# Patient Record
Sex: Male | Born: 1955
Health system: Southern US, Community
[De-identification: ages and names within clinical notes are randomized; demographics above are authoritative.]

## PROBLEM LIST (undated history)

## (undated) DIAGNOSIS — G4733 Obstructive sleep apnea (adult) (pediatric): Secondary | ICD-10-CM

## (undated) DIAGNOSIS — F1911 Other psychoactive substance abuse, in remission: Secondary | ICD-10-CM

## (undated) DIAGNOSIS — M779 Enthesopathy, unspecified: Secondary | ICD-10-CM

## (undated) DIAGNOSIS — E785 Hyperlipidemia, unspecified: Secondary | ICD-10-CM

## (undated) DIAGNOSIS — I739 Peripheral vascular disease, unspecified: Secondary | ICD-10-CM

## (undated) DIAGNOSIS — N200 Calculus of kidney: Secondary | ICD-10-CM

## (undated) DIAGNOSIS — E538 Deficiency of other specified B group vitamins: Secondary | ICD-10-CM

## (undated) HISTORY — DX: Other psychoactive substance abuse, in remission: F19.11

## (undated) HISTORY — DX: Hyperlipidemia, unspecified: E78.5

## (undated) HISTORY — PX: VARICOSE VEIN SURGERY: SHX832

## (undated) HISTORY — DX: Calculus of kidney: N20.0

## (undated) HISTORY — DX: Obstructive sleep apnea (adult) (pediatric): G47.33

## (undated) HISTORY — DX: Deficiency of other specified B group vitamins: E53.8

## (undated) HISTORY — PX: TONSILLECTOMY: SUR1361

## (undated) HISTORY — PX: TOTAL KNEE ARTHROPLASTY: SHX125

---

## 2002-10-14 DIAGNOSIS — I739 Peripheral vascular disease, unspecified: Secondary | ICD-10-CM

## 2002-10-14 HISTORY — DX: Peripheral vascular disease, unspecified: I73.9

## 2003-10-15 HISTORY — PX: BACK SURGERY: SHX140

## 2004-06-13 ENCOUNTER — Inpatient Hospital Stay (HOSPITAL_COMMUNITY): Admission: AD | Admit: 2004-06-13 | Discharge: 2004-06-15 | Payer: Self-pay | Admitting: Orthopaedic Surgery

## 2005-11-30 ENCOUNTER — Emergency Department: Payer: Self-pay | Admitting: Emergency Medicine

## 2010-08-03 ENCOUNTER — Encounter: Admission: RE | Admit: 2010-08-03 | Discharge: 2010-08-03 | Payer: Self-pay | Admitting: Orthopedic Surgery

## 2010-10-14 DIAGNOSIS — M779 Enthesopathy, unspecified: Secondary | ICD-10-CM

## 2010-10-14 HISTORY — DX: Enthesopathy, unspecified: M77.9

## 2010-10-14 HISTORY — PX: KNEE ARTHROPLASTY: SHX992

## 2010-10-31 LAB — CBC
HCT: 41.4 % (ref 39.0–52.0)
Hemoglobin: 14.4 g/dL (ref 13.0–17.0)
MCH: 31 pg (ref 26.0–34.0)
MCHC: 34.8 g/dL (ref 30.0–36.0)
MCV: 89.2 fL (ref 78.0–100.0)
Platelets: 230 10*3/uL (ref 150–400)
RBC: 4.64 MIL/uL (ref 4.22–5.81)
RDW: 12.4 % (ref 11.5–15.5)
WBC: 7.7 10*3/uL (ref 4.0–10.5)

## 2010-10-31 LAB — DIFFERENTIAL
Basophils Absolute: 0 10*3/uL (ref 0.0–0.1)
Basophils Relative: 1 % (ref 0–1)
Eosinophils Absolute: 0.2 10*3/uL (ref 0.0–0.7)
Eosinophils Relative: 2 % (ref 0–5)
Lymphocytes Relative: 22 % (ref 12–46)
Lymphs Abs: 1.7 10*3/uL (ref 0.7–4.0)
Monocytes Absolute: 0.9 10*3/uL (ref 0.1–1.0)
Monocytes Relative: 11 % (ref 3–12)
Neutro Abs: 5 10*3/uL (ref 1.7–7.7)
Neutrophils Relative %: 65 % (ref 43–77)

## 2010-10-31 LAB — APTT: aPTT: 33 seconds (ref 24–37)

## 2010-10-31 LAB — BASIC METABOLIC PANEL
BUN: 23 mg/dL (ref 6–23)
CO2: 25 mEq/L (ref 19–32)
Calcium: 9.4 mg/dL (ref 8.4–10.5)
Chloride: 110 mEq/L (ref 96–112)
Creatinine, Ser: 1.07 mg/dL (ref 0.4–1.5)
GFR calc Af Amer: >60 mL/min (ref >60)
GFR calc non Af Amer: >60 mL/min (ref >60)
Glucose, Bld: 81 mg/dL (ref 70–99)
Potassium: 3.9 mEq/L (ref 3.5–5.1)
Sodium: 141 mEq/L (ref 135–145)

## 2010-10-31 LAB — URINALYSIS, ROUTINE W REFLEX MICROSCOPIC
Hgb urine dipstick: NEGATIVE
Ketones, ur: NEGATIVE mg/dL
Nitrite: NEGATIVE
Protein, ur: NEGATIVE mg/dL
Specific Gravity, Urine: 1.027 (ref 1.005–1.030)
Urine Glucose, Fasting: NEGATIVE mg/dL
Urobilinogen, UA: 0.2 mg/dL (ref 0.0–1.0)
pH: 5.5 (ref 5.0–8.0)

## 2010-10-31 LAB — PROTIME-INR
INR: 0.98 (ref 0.00–1.49)
Prothrombin Time: 13.2 seconds (ref 11.6–15.2)

## 2010-10-31 LAB — SURGICAL PCR SCREEN
MRSA, PCR: NEGATIVE
Staphylococcus aureus: NEGATIVE

## 2010-11-05 ENCOUNTER — Inpatient Hospital Stay (HOSPITAL_COMMUNITY)
Admission: RE | Admit: 2010-11-05 | Discharge: 2010-11-07 | Payer: Self-pay | Source: Home / Self Care | Attending: Orthopedic Surgery | Admitting: Orthopedic Surgery

## 2010-11-06 LAB — TYPE AND SCREEN
ABO/RH(D): O POS
Antibody Screen: NEGATIVE

## 2010-11-06 LAB — ABO/RH: ABO/RH(D): O POS

## 2010-11-07 LAB — BASIC METABOLIC PANEL
BUN: 17 mg/dL (ref 6–23)
BUN: 16 mg/dL (ref 6–23)
CO2: 28 mEq/L (ref 19–32)
CO2: 27 mEq/L (ref 19–32)
Calcium: 8.1 mg/dL — ABNORMAL LOW (ref 8.4–10.5)
Calcium: 8.3 mg/dL — ABNORMAL LOW (ref 8.4–10.5)
Chloride: 108 mEq/L (ref 96–112)
Chloride: 109 mEq/L (ref 96–112)
Creatinine, Ser: 0.91 mg/dL (ref 0.4–1.5)
Creatinine, Ser: 0.97 mg/dL (ref 0.4–1.5)
GFR calc Af Amer: >60 mL/min (ref >60)
GFR calc Af Amer: >60 mL/min (ref >60)
GFR calc non Af Amer: >60 mL/min (ref >60)
GFR calc non Af Amer: >60 mL/min (ref >60)
Glucose, Bld: 131 mg/dL — ABNORMAL HIGH (ref 70–99)
Glucose, Bld: 146 mg/dL — ABNORMAL HIGH (ref 70–99)
Potassium: 4.1 mEq/L (ref 3.5–5.1)
Potassium: 3.7 mEq/L (ref 3.5–5.1)
Sodium: 141 mEq/L (ref 135–145)
Sodium: 141 mEq/L (ref 135–145)

## 2010-11-07 LAB — CBC
HCT: 29.2 % — ABNORMAL LOW (ref 39.0–52.0)
HCT: 28.4 % — ABNORMAL LOW (ref 39.0–52.0)
Hemoglobin: 10 g/dL — ABNORMAL LOW (ref 13.0–17.0)
Hemoglobin: 10.1 g/dL — ABNORMAL LOW (ref 13.0–17.0)
MCH: 30.5 pg (ref 26.0–34.0)
MCH: 31.4 pg (ref 26.0–34.0)
MCHC: 34.2 g/dL (ref 30.0–36.0)
MCHC: 35.6 g/dL (ref 30.0–36.0)
MCV: 89 fL (ref 78.0–100.0)
MCV: 88.2 fL (ref 78.0–100.0)
Platelets: 153 10*3/uL (ref 150–400)
Platelets: 159 10*3/uL (ref 150–400)
RBC: 3.28 MIL/uL — ABNORMAL LOW (ref 4.22–5.81)
RBC: 3.22 MIL/uL — ABNORMAL LOW (ref 4.22–5.81)
RDW: 12.4 % (ref 11.5–15.5)
RDW: 12.5 % (ref 11.5–15.5)
WBC: 7.9 10*3/uL (ref 4.0–10.5)
WBC: 9.8 10*3/uL (ref 4.0–10.5)

## 2010-11-12 NOTE — Op Note (Signed)
NAMEJERARD, BAYS NO.:  [id number]  MEDICAL RECORD NO.:  [id number]          PATIENT TYPE:  INP  LOCATION:  0003                         FACILITY:  [facility]  PHYSICIAN:  Madlyn Frankel. Charlann Boxer, M.D.  DATE OF BIRTH:  May 29, 1956  DATE OF PROCEDURE:  11/05/2010 DATE OF DISCHARGE:                              OPERATIVE REPORT   PREOPERATIVE DIAGNOSIS:  Left knee osteoarthritis.  POSTOPERATIVE DIAGNOSIS:  Left knee osteoarthritis.  PROCEDURE:  Left total knee replacement utilizing DePuy rotating platform posterior stabilized knee system size 6 femur, 5 tibia, 10 mm insert to match the 6 femur and a 41 patellar button.  SURGEON:  Madlyn Frankel. Charlann Boxer, M.D.  ASSISTANT:  Nelia Shi. Webb Silversmith, nurse practitioner.  ANESTHESIA:  General.  SPECIMENS:  None.  COMPLICATIONS:  None.  DRAINS:  One Hemovac.  TOURNIQUET TIME:  44 minutes at 250 mmHg.  BLOOD LOSS:  About 100 cc.  INDICATION OF THE PROCEDURE:  Mr. Liston is a 55 year old male who presented to the office for evaluation of left greater than right knee pain.  The radiographs revealed bone-on-bone changes medially predominantly.  There was some concern about subluxation and tricompartmental degenerative changes that made him more of a candidate for total knee replacement versus partial.  Risks of infection, DVT, component failure, need for revision surgery for any reason were discussed and reviewed.  Consent was obtained after reviewing the postop course and expectations.  PROCEDURE IN DETAIL:  The patient was brought to operative theater. Once adequate anesthesia, preoperative antibiotics, and Ancef 2 g administered, the patient was positioned supine on the operating table. Thigh tourniquet was placed.  Left lower extremity was then prepped and draped in sterile fashion with the left foot into Mayo leg holder.  Time- out was performed identifying the patient, planned procedure, and extremity.  Leg was  exsanguinated, tourniquet elevated to 250 mmHg.  Midline incision was made followed by median arthrotomy.  Following initial exposure and soft tissue debridement, attention was first directed to patella.  Precut measurement was 26 mm, this was resected down to 15 mm and used a 41 patella button to restore height.  Lug holes were drilled and a metal shim placed to protect the cut surface and retraction saw blades.  Attention was now directed to the femur.  Femoral canal was opened.  A drill irrigated to try to prevent fat emboli and intramedullary rod was passed and 5 degrees of valgus 10 mm bone was resected off the distal femur.  Following this resection, the tibia was subluxated anteriorly. Using extramedullary guide, I made a measured cut of 10 mm based off the proximal lateral tibia.  This cut was confirmed to be perpendicular in the coronal plane and parallel to the tibial axis.  At this point, I sized the femur to be a size 6.  While we opened up the size 6 components, attention was directed to the tibia for completion.  I removed medial osteophytes, and the tibial surface seemed to be best fit with size 5 tray.  This tray was pinned into position, drilled and keel punched.  At this  time, the instrumentation for the 6 femur was opened.  A size 6 rotation block was then pinned into position, anterior referenced, and held parallel to the tibial tray.  It was pinned into position.  Thus 4/1 cutting block was then positioned.  Anterior-posterior and chamfer cuts were then made without difficulty nor notching.  At this point, final box cut was made off to the lateral aspect of distal femur.  The size 6 femoral component was then impacted into position and trial reduction was carried out with a 10-mm insert.  We found the knee came to full extension and flexed nicely with the patella tracking through the trochlea without application of pressure.  Given this, the final components  were opened holding the polyethylene insert. The trial components removed.  Synovial capsule junction was injected with 0.4% Marcaine with epinephrine and 1 cc of Toradol.  Following further debridements, the knee was irrigated with normal saline solution with pulse lavage and cement mixed.  Final components were then cemented onto clean and dried cuts surfaces of bone with a 10-mm insert placed.  The knee was brought to full extension and extruded cement was removed.  Once cement had fully cured, excess cement was removed throughout the knee.  Once I was satisfied that it was unable to visualize any more, we chose a 10-mm insert based on trial reductions.  The 10-mm insert was then placed, and the knee reduced.  The knee was irrigated throughout the case and again at this point.  Tourniquet had been let down after 44 minutes.  Medium Hemovac drain was placed deep.  The extensor mechanism was then reapproximated with the knee in flexion using #1 Vicryl.  The remainder of the wound was closed with 2-0 Vicryl and running 4-0 Monocryl.  The knee was cleaned, dried, and dressed sterilely utilizing Dermabond and Aquacel dressing.  The patient was then brought to recovery room in stable condition tolerating the procedure well.     Madlyn Frankel Charlann Boxer, M.D.     MDO/MEDQ  D:  11/05/2010  T:  11/05/2010  Job:  [id number]  Electronically Signed by Durene Romans M.D. on 11/12/2010 07:05:14 AM

## 2010-11-25 NOTE — Discharge Summary (Signed)
  NAMEFLOY, Steve Lynch NO.:  [id number]  MEDICAL RECORD NO.:  [id number]          PATIENT TYPE:  INP  LOCATION:  1605                         FACILITY:  [facility]  PHYSICIAN:  Steve Frankel. Charlann Lynch, M.D.  DATE OF BIRTH:  08-19-1956  DATE OF ADMISSION:  11/05/2010 DATE OF DISCHARGE:                              DISCHARGE SUMMARY   ADMITTING DIAGNOSIS:  Left knee osteoarthritis.  BRIEF HISTORY:  This 55 year old gentleman followed by Dr. Charlann Lynch for about 2 years for left knee issues, in October came progressively worsened.  After failed conservative treatments, he decided to move forward with arthroplasty.  HOSPITAL COURSE:  The patient was admitted through same-day surgery on 23rd, taken to the operating theater and underwent left knee arthroplasty without any difficulties, taken to PACU for recovery and brought to [facility] for further recovery and rehabilitation.  Since that time, he has advanced his diet to regular.  He has been on physical therapy and done well.  Discharge disposition plan is for SNF for rehabilitation and follow up in less than 2 weeks.  PAST MEDICAL HISTORY:  Significant only for degenerative disk disease. He is otherwise healthy.  MEDICATIONS:  He takes no medications.  ALLERGIES:  He has no allergies.  DISCHARGE CONDITION:  Good.  DISCHARGE DIAGNOSIS:  Left knee osteoarthritis status post arthroplasty.  DISCHARGE INSTRUCTIONS:  Instructions were given.  He knows to keep the wound dry, but he can shower.  He has an Aquacel dressing in place which will need to come off in about 7 days.  He will follow up with Dr. Nilsa Nutting office in 2 weeks.  He will be in a SNF for rehabilitation. Asked him places of preference.  LABORATORY DATA:  His laboratories have been stable.  His vital signs are stable this morning.  His H and H are 10.1 and 28.4.  His sodium is 141, potassium 3.7.  DISCHARGE MEDICATIONS: 1. Acetaminophen 325 mg every 4 hours as  needed. 2. Keflex 500 mg 4 times a day by mouth for 14 days for a possible     oral infection. 3. Docusate sodium 100 mg twice daily as needed. 4. Ferrous sulfate 325 mg 3 times a day for 3 weeks. 5. Hydrocodone  7.5/325, 1-2 every 4-6 hours p.r.n. pain. 6. Robaxin 500 mg every 6 hours as needed. 7. MiraLax 17 grams a day as needed. 8. Xarelto 10 mg a day for 10 days. 9. Fish oil daily. 10.Ibuprofen as needed. 11.Lodine as needed twice daily.     Russell L. Webb Silversmith, RN   ______________________________ Steve Lynch, M.D.    RLW/MEDQ  D:  11/07/2010  T:  11/07/2010  Job:  [id number]  Electronically Signed by Lauree Chandler NP-C on 11/21/2010 09:42:01 AM Electronically Signed by Durene Romans M.D. on 11/25/2010 09:16:33 AM

## 2010-12-10 ENCOUNTER — Encounter: Payer: Self-pay | Admitting: Orthopedic Surgery

## 2010-12-11 ENCOUNTER — Other Ambulatory Visit: Payer: Self-pay | Admitting: Oral Surgery

## 2010-12-11 DIAGNOSIS — R52 Pain, unspecified: Secondary | ICD-10-CM

## 2010-12-13 ENCOUNTER — Encounter: Payer: Self-pay | Admitting: Orthopedic Surgery

## 2010-12-17 ENCOUNTER — Other Ambulatory Visit: Payer: Self-pay

## 2010-12-20 ENCOUNTER — Ambulatory Visit
Admission: RE | Admit: 2010-12-20 | Discharge: 2010-12-20 | Disposition: A | Discharge status: Home / Self Care | Payer: BC Managed Care – PPO | Source: Ambulatory Visit | Attending: Oral Surgery | Admitting: Oral Surgery

## 2010-12-20 DIAGNOSIS — R52 Pain, unspecified: Secondary | ICD-10-CM

## 2011-01-13 ENCOUNTER — Encounter: Payer: Self-pay | Admitting: Orthopedic Surgery

## 2011-03-01 NOTE — Op Note (Signed)
NAME:  Steve Lynch, Steve Lynch                            ACCOUNT NO.:  [id number]   MEDICAL RECORD NO.:  [id number]                   PATIENT TYPE:  OIB   LOCATION:  2899                                 FACILITY:  [facility]   PHYSICIAN:  Sharolyn Douglas, M.D.                     DATE OF BIRTH:  Dec 12, 1955   DATE OF PROCEDURE:  06/13/2004  DATE OF DISCHARGE:                                 OPERATIVE REPORT   DIAGNOSIS:  C6-7 herniated nucleus pulposus with right upper extremity  radiculopathy.   PROCEDURES:  1.  Anterior cervical discectomy C6-7.  2.  Anterior cervical arthrodesis C6-7.  Placement of 8 mm allograft      prosthesis spacer packed with local autogenous bone graft.  3.  Anterior cervical plating C6-7.   SURGEON:  Sharolyn Douglas, M.D.   ASSISTANT:  Verlin Fester, P.A.   ANESTHESIA:  General endotracheal anesthesia.   COMPLICATIONS:  None.   INDICATIONS FOR PROCEDURE:  The patient is a 55 year old pleasant gentleman  with severe right upper extremity radiculopathy secondary to a large C6-7  disc rupture.  He has been refractory to conservative treatment options,  elected to undergo anterior cervical discectomy and fusion C6-7 in hopes of  improving his symptoms.  Risks, benefits, and alternatives were reviewed.   DESCRIPTION OF PROCEDURE:  The patient was properly identified in the  holding area, taken to the operating room where he underwent general  endotracheal anesthesia without difficulty, given prophylactic IV  antibiotics, carefully positioned with his neck in slight extension, 5  pounds halter traction applied, neck prepped and draped in usual sterile  fashion.  A 5 cm incision made in the left side of the neck in transverse  fashion just below the cricoid cartilage.  Dissection was carried sharply  through the platysma.  The interval between the SCM and strap muscles was  developed down to the prevertebral space.  The C6-7 level was identified by  the anterior osteophyte.  Spinal  needle placed.  X-ray taken to confirm this  location.  The esophagus, trachea and carotid sheath were identified and  protected at all times.  Longus colli muscle elevated  out of the C6-7 disc  space.  Deep Shadow-Line retractor placed.  Sharp annulotomy.  Radical  discectomy back to the posterior longitudinal ligament.  Caspar distraction.  Pins were placed in C6-C7 vertebral bodies.  There was a large rent noted in  the posterior longitudinal ligament on the right side.  The ligament was  taken down and a large subligamentous disc rupture was removed from the  spinal canal and right neural foramen.  A foraminotomy was completed.  Spinal canal was explored with a blunt probe.  We felt we had a good  decompression.  Wound was irrigated.  Bleeding controlled with bipolar  electrocautery and FloSeal.  At this point, we placed an 8 mm Allograft  prosthesis spacer which had been packed with local bone graft from the bur  shavings.  The graft was carefully countersunk 1 mm.  We then placed a 26 mm  [employer] anterior cervical plate with four 13 mm screws.  We insured  the locking mechanism engaged.  The bone quality was good.  The wound was  irrigated.  Esophagus, trachea and carotid sheath examined. There were no  apparent injuries. The Penrose drain left in place.  The platysma closed  with interrupted 2-0 Vicryl.  Subcutaneous layer closed with 2-  0 Vicryl followed by 3-0 Vicryl then a subcuticular 4-0 Vicryl suture on the  skin edges.  Benzoin and Steri-Strips placed. Sterile dressing applied.  Soft collar placed.  The patient was extubated without difficulty,  transferred to recovery in stable condition able to move his upper and lower  extremities.                                               Sharolyn Douglas, M.D.    MC/MEDQ  D:  06/13/2004  T:  06/14/2004  Job:  [id number]

## 2011-05-03 ENCOUNTER — Other Ambulatory Visit (HOSPITAL_COMMUNITY): Payer: Self-pay | Admitting: Family Medicine

## 2011-05-03 ENCOUNTER — Ambulatory Visit (HOSPITAL_COMMUNITY)
Admission: RE | Admit: 2011-05-03 | Discharge: 2011-05-03 | Disposition: A | Discharge status: Home / Self Care | Payer: BC Managed Care – PPO | Source: Ambulatory Visit | Attending: Family Medicine | Admitting: Family Medicine

## 2011-05-03 DIAGNOSIS — R0602 Shortness of breath: Secondary | ICD-10-CM | POA: Insufficient documentation

## 2011-05-03 DIAGNOSIS — R05 Cough: Secondary | ICD-10-CM

## 2011-05-03 DIAGNOSIS — R059 Cough, unspecified: Secondary | ICD-10-CM

## 2011-05-06 ENCOUNTER — Other Ambulatory Visit (HOSPITAL_COMMUNITY): Payer: Self-pay | Admitting: Family Medicine

## 2011-05-06 DIAGNOSIS — R1110 Vomiting, unspecified: Secondary | ICD-10-CM

## 2011-05-08 ENCOUNTER — Other Ambulatory Visit (HOSPITAL_COMMUNITY): Payer: BC Managed Care – PPO

## 2011-10-30 ENCOUNTER — Encounter (HOSPITAL_COMMUNITY): Payer: Self-pay | Admitting: Pharmacy Technician

## 2011-11-04 ENCOUNTER — Encounter (HOSPITAL_COMMUNITY): Payer: Self-pay

## 2011-11-04 ENCOUNTER — Encounter (HOSPITAL_COMMUNITY)
Admission: RE | Admit: 2011-11-04 | Discharge: 2011-11-04 | Disposition: A | Discharge status: Home / Self Care | Payer: BC Managed Care – PPO | Source: Ambulatory Visit | Attending: Orthopedic Surgery | Admitting: Orthopedic Surgery

## 2011-11-04 HISTORY — DX: Enthesopathy, unspecified: M77.9

## 2011-11-04 HISTORY — DX: Peripheral vascular disease, unspecified: I73.9

## 2011-11-04 LAB — URINALYSIS, ROUTINE W REFLEX MICROSCOPIC
Hgb urine dipstick: NEGATIVE
Ketones, ur: NEGATIVE mg/dL
Nitrite: NEGATIVE
Protein, ur: NEGATIVE mg/dL
Specific Gravity, Urine: 1.027 (ref 1.005–1.030)
Urobilinogen, UA: 0.2 mg/dL (ref 0.0–1.0)

## 2011-11-04 LAB — CBC
MCH: 29.5 pg (ref 26.0–34.0)
Platelets: 216 10*3/uL (ref 150–400)
RBC: 4.4 MIL/uL (ref 4.22–5.81)
RDW: 12.4 % (ref 11.5–15.5)
WBC: 8 10*3/uL (ref 4.0–10.5)

## 2011-11-04 LAB — APTT: aPTT: 32 seconds (ref 24–37)

## 2011-11-04 LAB — DIFFERENTIAL
Basophils Absolute: 0.1 10*3/uL (ref 0.0–0.1)
Eosinophils Absolute: 0.2 10*3/uL (ref 0.0–0.7)
Lymphs Abs: 1.7 10*3/uL (ref 0.7–4.0)
Neutrophils Relative %: 68 % (ref 43–77)

## 2011-11-04 LAB — BASIC METABOLIC PANEL
Calcium: 9.4 mg/dL (ref 8.4–10.5)
GFR calc Af Amer: >90 mL/min (ref >90)
GFR calc non Af Amer: >90 mL/min (ref >90)
Glucose, Bld: 83 mg/dL (ref 70–99)
Potassium: 3.8 mEq/L (ref 3.5–5.1)
Sodium: 139 mEq/L (ref 135–145)

## 2011-11-04 LAB — PROTIME-INR
INR: 0.99 (ref 0.00–1.49)
Prothrombin Time: 13.3 seconds (ref 11.6–15.2)

## 2011-11-04 MED ORDER — CHLORHEXIDINE GLUCONATE 4 % EX LIQD: 60.0000 mL | Freq: Once | CUTANEOUS | Status: DC

## 2011-11-04 NOTE — Pre-Procedure Instructions (Signed)
STATES HAD EPIDURAL STEROID INJECTION DR RAMOS 2 MONTHS AGO-        CLEARANCE DR Mellody Dance DATED 10/12 ON CHART    DENIES REFLUX- states takes Zantac PRN related to type of food ingested

## 2011-11-04 NOTE — Pre-Procedure Instructions (Signed)
Notified [facility] IN PORTABLE FOR NEED OF BARIATRIC BED DAY OF SURGERY

## 2011-11-04 NOTE — Patient Instructions (Signed)
20 Jahseh Lucchese Pauli  11/04/2011   Your procedure is scheduled on: 11/12/11  Tuesday   Surgery [id number]  Report to [facility] at 0515 AM.  [place] this number if you have problems the morning of surgery: [phone]    pst   [phone]   Remember:   Do not eat food:After Midnight. Monday night  May have clear liquids:until Midnight .   Monday night  Clear liquids include soda, tea, black coffee, apple or grape juice, broth.  Take these medicines the morning of surgery with A SIP OF WATER :none  Do not wear jewelry, make-up or nail polish.  Do not wear lotions, powders, or perfumes. You may wear deodorant.  Do not shave 48 hours prior to surgery.  Do not bring valuables to the hospital.  Contacts, dentures or bridgework may not be worn into surgery.  Leave suitcase in the car. After surgery it may be brought to your room.  For patients admitted to the hospital, checkout time is 11:00 AM the day of discharge.   Patients discharged the day of surgery will not be allowed to drive home.  Name and phone number of your driver:[employer] Special Instructions: CHG Shower Use Special Wash: 1/2 bottle night before surgery and 1/2 bottle morning of surgery.   Regular soap face and privates   Please read over the following fact sheets that you were given: MRSA Information

## 2011-11-09 NOTE — H&P (Signed)
Steve Lynch is an 56 y.o. male.    Chief Complaint: right knee OA and pain   HPI: Pt is a 56 y.o. male complaining of right knee pain for 6 years. Pain had continually increased since the beginning. Has a history of a previous left TKA. X-rays in the clinic show end-stage arthritic changes of the right knee. Pt has tried various conservative treatments which have failed to alleviate their symptoms, including steroid injections and NSAIDs. Various options are discussed with the patient. Risks, benefits and expectations were discussed with the patient. Patient understand the risks, benefits and expectations and wishes to proceed with surgery.   PCP:  No primary provider on file.  D/C Plans:  SNF/Rehab - Steve Lynch preferred  Post-op Meds:  No Rx given  Tranexamic Acid:  Not to be given  PMH: Past Medical History  Diagnosis Date  . Peripheral vascular disease     varicose veins right leg  . Tendonitis     temporal tendonitis as per pt 1/12 with locked jaw following tooth extraction    PSH: Past Surgical History  Procedure Date  . Schleratherapy     right leg  . Joint replacement   . Hernia repair     1/12   left knee  . Vascular surgery   . Back surgery     "DISC  REPLACEMENT"   L5-6   2005  . Tonsillectomy     Social History:  reports that he has never smoked. He has never used smokeless tobacco. He reports that he drinks alcohol. He reports that he does not use illicit drugs.  Allergies:  No Known Allergies  Medications: Current Facility-Administered Medications  Medication Dose Route Frequency Provider Last Rate Last Dose  . chlorhexidine (HIBICLENS) 4 % liquid 4 application  60 mL Topical Once Steve Lynch, [place]       Current Outpatient Prescriptions  Medication Sig Dispense Refill  . etodolac (LODINE) 400 MG tablet Take 400 mg by mouth 2 (two) times daily.      . fish oil-omega-3 fatty acids 1000 MG capsule Take 1 g by mouth daily.      Marland Kitchen ibuprofen  (ADVIL,MOTRIN) 200 MG tablet Take 400 mg by mouth every 6 (six) hours as needed. Pain       . ranitidine (ZANTAC) 150 MG tablet Take 150 mg by mouth daily as needed. Heart burn         ROS: Review of Systems  Constitutional: Negative.   HENT: Positive for tinnitus.   Eyes: Negative.   Respiratory: Negative.   Cardiovascular: Negative.   Gastrointestinal: Negative.   Genitourinary: Negative.   Musculoskeletal: Positive for back pain and joint pain.  Skin: Negative.   Neurological: Negative.   Endo/Heme/Allergies: Negative.   Psychiatric/Behavioral: Negative.      Physican Exam: Physical Exam  Constitutional: He is oriented to person, Lynch, and time and well-developed, well-nourished, and in no distress.  HENT:  Head: Normocephalic and atraumatic.  Nose: Nose normal.  Mouth/Throat: Oropharynx is clear and moist.  Neck: Neck supple. No JVD present. No tracheal deviation present. No thyromegaly present.  Cardiovascular: Normal rate and regular rhythm.   Pulmonary/Chest: Effort normal and breath sounds normal. No stridor.  Abdominal: Soft. There is no tenderness. There is no guarding.  Musculoskeletal:       Right knee: He exhibits decreased range of motion and bony tenderness. He exhibits no swelling, no effusion, no ecchymosis, no deformity, no laceration and no erythema. tenderness  found.  Lymphadenopathy:    He has no cervical adenopathy.  Neurological: He is alert and oriented to person, Lynch, and time.  Skin: Skin is warm and dry.  Psychiatric: Affect normal.    Assessment/Plan Assessment: right knee OA and pain   Plan: Patient will undergo a right total knee arthroplasty on 11/12/2011. Risks benefits and expectation were discussed with the patient. Patient understand risks, benefits and expectation and wishes to proceed.   Anastasio Auerbach Jadarion Halbig   PAC  11/09/2011, 4:55 PM

## 2011-11-12 ENCOUNTER — Encounter (HOSPITAL_COMMUNITY): Payer: Self-pay | Admitting: *Deleted

## 2011-11-12 ENCOUNTER — Encounter (HOSPITAL_COMMUNITY): Payer: Self-pay | Admitting: Anesthesiology

## 2011-11-12 ENCOUNTER — Inpatient Hospital Stay (HOSPITAL_COMMUNITY)
Admission: RE | Admit: 2011-11-12 | Discharge: 2011-11-14 | DRG: 209 | Disposition: A | Discharge status: Skilled Nursing Facility | Payer: BC Managed Care – PPO | Source: Ambulatory Visit | Attending: Orthopedic Surgery | Admitting: Orthopedic Surgery

## 2011-11-12 ENCOUNTER — Inpatient Hospital Stay (HOSPITAL_COMMUNITY): Payer: BC Managed Care – PPO | Admitting: Anesthesiology

## 2011-11-12 ENCOUNTER — Encounter (HOSPITAL_COMMUNITY)
Admission: RE | Disposition: A | Discharge status: Skilled Nursing Facility | Payer: Self-pay | Source: Ambulatory Visit | Attending: Orthopedic Surgery

## 2011-11-12 DIAGNOSIS — I739 Peripheral vascular disease, unspecified: Secondary | ICD-10-CM | POA: Diagnosis present

## 2011-11-12 DIAGNOSIS — M1710 Unilateral primary osteoarthritis, unspecified knee: Principal | ICD-10-CM | POA: Diagnosis present

## 2011-11-12 DIAGNOSIS — Z96659 Presence of unspecified artificial knee joint: Secondary | ICD-10-CM

## 2011-11-12 HISTORY — PX: TOTAL KNEE ARTHROPLASTY: SHX125

## 2011-11-12 LAB — TYPE AND SCREEN: Antibody Screen: NEGATIVE

## 2011-11-12 SURGERY — ARTHROPLASTY, KNEE, TOTAL
Anesthesia: Spinal | Site: Knee | Laterality: Right | Wound class: Clean

## 2011-11-12 MED ORDER — CEFAZOLIN SODIUM-DEXTROSE 2-3 GM-% IV SOLR
2.0000 g | Freq: Once | INTRAVENOUS | Status: AC
  Administered 2011-11-12: 2 g via INTRAVENOUS

## 2011-11-12 MED ORDER — HYDROCODONE-ACETAMINOPHEN 7.5-325 MG PO TABS
1.0000 | ORAL_TABLET | ORAL | Status: DC | PRN
  Administered 2011-11-12 (×2): 1 via ORAL
  Administered 2011-11-13 (×2): 2 via ORAL
  Filled 2011-11-12 (×2): qty 1
  Filled 2011-11-12 (×2): qty 2

## 2011-11-12 MED ORDER — ZOLPIDEM TARTRATE 5 MG PO TABS: 5.0000 mg | ORAL_TABLET | Freq: Every evening | ORAL | Status: DC | PRN

## 2011-11-12 MED ORDER — PROPOFOL 10 MG/ML IV EMUL
INTRAVENOUS | Status: DC | PRN
  Administered 2011-11-12: 100 mg via INTRAVENOUS
  Administered 2011-11-12: 200 mg via INTRAVENOUS

## 2011-11-12 MED ORDER — LACTATED RINGERS IV SOLN
INTRAVENOUS | Status: DC | PRN
  Administered 2011-11-12 (×3): via INTRAVENOUS

## 2011-11-12 MED ORDER — CEFAZOLIN SODIUM 1-5 GM-% IV SOLN
1.0000 g | Freq: Four times a day (QID) | INTRAVENOUS | Status: AC
  Administered 2011-11-12 – 2011-11-13 (×3): 1 g via INTRAVENOUS
  Filled 2011-11-12 (×3): qty 50

## 2011-11-12 MED ORDER — PHENOL 1.4 % MT LIQD
1.0000 | OROMUCOSAL | Status: DC | PRN
  Filled 2011-11-12: qty 177

## 2011-11-12 MED ORDER — METHOCARBAMOL 100 MG/ML IJ SOLN
500.0000 mg | Freq: Four times a day (QID) | INTRAVENOUS | Status: DC | PRN
  Administered 2011-11-12: 500 mg via INTRAVENOUS
  Filled 2011-11-12 (×3): qty 5

## 2011-11-12 MED ORDER — BUPIVACAINE-EPINEPHRINE PF 0.25-1:200000 % IJ SOLN
INTRAMUSCULAR | Status: DC | PRN
  Administered 2011-11-12: 60 mL

## 2011-11-12 MED ORDER — PROMETHAZINE HCL 25 MG/ML IJ SOLN: 6.2500 mg | INTRAMUSCULAR | Status: DC | PRN

## 2011-11-12 MED ORDER — SENNA 8.6 MG PO TABS
1.0000 | ORAL_TABLET | Freq: Two times a day (BID) | ORAL | Status: DC
  Administered 2011-11-12 – 2011-11-14 (×4): 8.6 mg via ORAL
  Filled 2011-11-12 (×4): qty 1

## 2011-11-12 MED ORDER — FENTANYL CITRATE 0.05 MG/ML IJ SOLN
INTRAMUSCULAR | Status: DC | PRN
  Administered 2011-11-12 (×2): 100 ug via INTRAVENOUS
  Administered 2011-11-12 (×3): 50 ug via INTRAVENOUS

## 2011-11-12 MED ORDER — DIPHENHYDRAMINE HCL 12.5 MG/5ML PO ELIX: 25.0000 mg | ORAL_SOLUTION | Freq: Four times a day (QID) | ORAL | Status: DC | PRN

## 2011-11-12 MED ORDER — METHOCARBAMOL 500 MG PO TABS
500.0000 mg | ORAL_TABLET | Freq: Four times a day (QID) | ORAL | Status: DC | PRN
  Administered 2011-11-13 – 2011-11-14 (×2): 500 mg via ORAL
  Filled 2011-11-12 (×3): qty 1

## 2011-11-12 MED ORDER — DEXAMETHASONE SODIUM PHOSPHATE 10 MG/ML IJ SOLN
INTRAMUSCULAR | Status: DC | PRN
  Administered 2011-11-12: 10 mg via INTRAVENOUS

## 2011-11-12 MED ORDER — HYDROMORPHONE HCL PF 1 MG/ML IJ SOLN
0.2000 mg | INTRAMUSCULAR | Status: DC | PRN
  Administered 2011-11-13: 0.6 mg via INTRAVENOUS
  Filled 2011-11-12: qty 1

## 2011-11-12 MED ORDER — SODIUM CHLORIDE 0.9 % IV SOLN
INTRAVENOUS | Status: DC
  Filled 2011-11-12: qty 1000

## 2011-11-12 MED ORDER — HYDROMORPHONE HCL PF 1 MG/ML IJ SOLN: 0.2500 mg | INTRAMUSCULAR | Status: DC | PRN

## 2011-11-12 MED ORDER — ONDANSETRON HCL 4 MG/2ML IJ SOLN
INTRAMUSCULAR | Status: DC | PRN
  Administered 2011-11-12: 4 mg via INTRAVENOUS

## 2011-11-12 MED ORDER — ROPIVACAINE HCL 5 MG/ML IJ SOLN
INTRAMUSCULAR | Status: DC | PRN
  Administered 2011-11-12: 30 mL

## 2011-11-12 MED ORDER — LACTATED RINGERS IV SOLN: INTRAVENOUS | Status: DC | PRN

## 2011-11-12 MED ORDER — ONDANSETRON HCL 4 MG PO TABS: 4.0000 mg | ORAL_TABLET | Freq: Four times a day (QID) | ORAL | Status: DC | PRN

## 2011-11-12 MED ORDER — DOCUSATE SODIUM 100 MG PO CAPS
100.0000 mg | ORAL_CAPSULE | Freq: Two times a day (BID) | ORAL | Status: DC
  Administered 2011-11-12 – 2011-11-14 (×4): 100 mg via ORAL
  Filled 2011-11-12 (×7): qty 1

## 2011-11-12 MED ORDER — DROPERIDOL 2.5 MG/ML IJ SOLN
INTRAMUSCULAR | Status: DC | PRN
  Administered 2011-11-12: 0.625 mg via INTRAVENOUS

## 2011-11-12 MED ORDER — LIDOCAINE HCL (CARDIAC) 20 MG/ML IV SOLN
INTRAVENOUS | Status: DC | PRN
  Administered 2011-11-12: 100 mg via INTRAVENOUS

## 2011-11-12 MED ORDER — POLYETHYLENE GLYCOL 3350 17 G PO PACK
17.0000 g | PACK | Freq: Every day | ORAL | Status: DC | PRN
  Filled 2011-11-12: qty 1

## 2011-11-12 MED ORDER — 0.9 % SODIUM CHLORIDE (POUR BTL) OPTIME
TOPICAL | Status: DC | PRN
  Administered 2011-11-12: 1000 mL

## 2011-11-12 MED ORDER — SODIUM CHLORIDE 0.9 % IV SOLN
INTRAVENOUS | Status: DC
  Administered 2011-11-12 – 2011-11-13 (×4): via INTRAVENOUS
  Filled 2011-11-12 (×11): qty 1000

## 2011-11-12 MED ORDER — MENTHOL 3 MG MT LOZG
1.0000 | LOZENGE | OROMUCOSAL | Status: DC | PRN
  Filled 2011-11-12: qty 9

## 2011-11-12 MED ORDER — RIVAROXABAN 10 MG PO TABS
10.0000 mg | ORAL_TABLET | Freq: Every day | ORAL | Status: DC
  Administered 2011-11-13 – 2011-11-14 (×2): 10 mg via ORAL
  Filled 2011-11-12 (×3): qty 1

## 2011-11-12 MED ORDER — FERROUS SULFATE 325 (65 FE) MG PO TABS
325.0000 mg | ORAL_TABLET | Freq: Three times a day (TID) | ORAL | Status: DC
  Administered 2011-11-12 – 2011-11-14 (×7): 325 mg via ORAL
  Filled 2011-11-12 (×11): qty 1

## 2011-11-12 MED ORDER — LACTATED RINGERS IV SOLN
INTRAVENOUS | Status: DC
  Administered 2011-11-12: 100 mL/h via INTRAVENOUS

## 2011-11-12 MED ORDER — ONDANSETRON HCL 4 MG/2ML IJ SOLN: 4.0000 mg | Freq: Four times a day (QID) | INTRAMUSCULAR | Status: DC | PRN

## 2011-11-12 MED ORDER — SUCCINYLCHOLINE CHLORIDE 20 MG/ML IJ SOLN
INTRAMUSCULAR | Status: DC | PRN
  Administered 2011-11-12: 100 mg via INTRAVENOUS

## 2011-11-12 MED ORDER — MIDAZOLAM HCL 5 MG/5ML IJ SOLN
INTRAMUSCULAR | Status: DC | PRN
  Administered 2011-11-12: 2 mg via INTRAVENOUS

## 2011-11-12 MED ORDER — HYDROMORPHONE HCL PF 1 MG/ML IJ SOLN
INTRAMUSCULAR | Status: DC | PRN
  Administered 2011-11-12: 0.5 mg via INTRAVENOUS
  Administered 2011-11-12: 1 mg via INTRAVENOUS
  Administered 2011-11-12: 0.5 mg via INTRAVENOUS

## 2011-11-12 MED ORDER — ALUM & MAG HYDROXIDE-SIMETH 200-200-20 MG/5ML PO SUSP: 30.0000 mL | ORAL | Status: DC | PRN

## 2011-11-12 MED ORDER — ACETAMINOPHEN 10 MG/ML IV SOLN
INTRAVENOUS | Status: DC | PRN
  Administered 2011-11-12: 1000 mg via INTRAVENOUS

## 2011-11-12 MED ORDER — KETOROLAC TROMETHAMINE 30 MG/ML IJ SOLN
INTRAMUSCULAR | Status: DC | PRN
  Administered 2011-11-12: 30 mg

## 2011-11-12 SURGICAL SUPPLY — 58 items
BAG ZIPLOCK 12X15 (MISCELLANEOUS) ×2 IMPLANT
BANDAGE ELASTIC 6 VELCRO ST LF (GAUZE/BANDAGES/DRESSINGS) ×2 IMPLANT
BANDAGE ESMARK 6X9 LF (GAUZE/BANDAGES/DRESSINGS) ×1 IMPLANT
BLADE SAW SGTL 13.0X1.19X90.0M (BLADE) ×2 IMPLANT
BNDG ESMARK 6X9 LF (GAUZE/BANDAGES/DRESSINGS) ×2
BOWL SMART MIX CTS (DISPOSABLE) ×2 IMPLANT
CEMENT HV SMART SET (Cement) ×4 IMPLANT
CLOTH BEACON ORANGE TIMEOUT ST (SAFETY) ×2 IMPLANT
CUFF TOURN SGL QUICK 34 (TOURNIQUET CUFF) ×1
CUFF TRNQT CYL 34X4X40X1 (TOURNIQUET CUFF) ×1 IMPLANT
DECANTER SPIKE VIAL GLASS SM (MISCELLANEOUS) IMPLANT
DERMABOND ADVANCED (GAUZE/BANDAGES/DRESSINGS) ×1
DERMABOND ADVANCED .7 DNX12 (GAUZE/BANDAGES/DRESSINGS) ×1 IMPLANT
DRAPE EXTREMITY T 121X128X90 (DRAPE) ×2 IMPLANT
DRAPE POUCH INSTRU U-SHP 10X18 (DRAPES) ×2 IMPLANT
DRAPE U-SHAPE 47X51 STRL (DRAPES) ×2 IMPLANT
DRSG AQUACEL AG ADV 3.5X10 (GAUZE/BANDAGES/DRESSINGS) ×2 IMPLANT
DRSG TEGADERM 4X4.75 (GAUZE/BANDAGES/DRESSINGS) ×2 IMPLANT
DURAPREP 26ML APPLICATOR (WOUND CARE) ×2 IMPLANT
ELECT REM PT RETURN 9FT ADLT (ELECTROSURGICAL) ×2
ELECTRODE REM PT RTRN 9FT ADLT (ELECTROSURGICAL) ×1 IMPLANT
EVACUATOR 1/8 PVC DRAIN (DRAIN) ×2 IMPLANT
FACESHIELD LNG OPTICON STERILE (SAFETY) ×10 IMPLANT
GAUZE SPONGE 2X2 8PLY STRL LF (GAUZE/BANDAGES/DRESSINGS) ×1 IMPLANT
GLOVE BIOGEL PI IND STRL 6.5 (GLOVE) ×2 IMPLANT
GLOVE BIOGEL PI IND STRL 7.5 (GLOVE) ×1 IMPLANT
GLOVE BIOGEL PI IND STRL 8 (GLOVE) ×1 IMPLANT
GLOVE BIOGEL PI INDICATOR 6.5 (GLOVE) ×2
GLOVE BIOGEL PI INDICATOR 7.5 (GLOVE) ×1
GLOVE BIOGEL PI INDICATOR 8 (GLOVE) ×1
GLOVE ECLIPSE 8.0 STRL XLNG CF (GLOVE) ×2 IMPLANT
GLOVE ORTHO TXT STRL SZ7.5 (GLOVE) ×4 IMPLANT
GLOVE SURG SS PI 6.5 STRL IVOR (GLOVE) ×4 IMPLANT
GOWN BRE IMP PREV XXLGXLNG (GOWN DISPOSABLE) IMPLANT
GOWN STRL NON-REIN LRG LVL3 (GOWN DISPOSABLE) ×6 IMPLANT
GOWN STRL REIN XL XLG (GOWN DISPOSABLE) ×2 IMPLANT
HANDPIECE INTERPULSE COAX TIP (DISPOSABLE) ×1
IMMOBILIZER KNEE 22 UNIV (SOFTGOODS) ×2 IMPLANT
KIT BASIN OR (CUSTOM PROCEDURE TRAY) ×2 IMPLANT
MANIFOLD NEPTUNE II (INSTRUMENTS) ×2 IMPLANT
NDL SAFETY ECLIPSE 18X1.5 (NEEDLE) ×1 IMPLANT
NEEDLE HYPO 18GX1.5 SHARP (NEEDLE) ×1
NS IRRIG 1000ML POUR BTL (IV SOLUTION) ×4 IMPLANT
PACK TOTAL JOINT (CUSTOM PROCEDURE TRAY) ×2 IMPLANT
POSITIONER SURGICAL ARM (MISCELLANEOUS) ×2 IMPLANT
SET HNDPC FAN SPRY TIP SCT (DISPOSABLE) ×1 IMPLANT
SET PAD KNEE POSITIONER (MISCELLANEOUS) ×2 IMPLANT
SPONGE GAUZE 2X2 STER 10/PKG (GAUZE/BANDAGES/DRESSINGS) ×1
SUCTION FRAZIER 12FR DISP (SUCTIONS) ×2 IMPLANT
SUT MNCRL AB 4-0 PS2 18 (SUTURE) ×2 IMPLANT
SUT VIC AB 1 CT1 36 (SUTURE) ×6 IMPLANT
SUT VIC AB 2-0 CT1 27 (SUTURE) ×3
SUT VIC AB 2-0 CT1 TAPERPNT 27 (SUTURE) ×3 IMPLANT
SYR 50ML LL SCALE MARK (SYRINGE) ×2 IMPLANT
TOWEL OR 17X26 10 PK STRL BLUE (TOWEL DISPOSABLE) ×4 IMPLANT
TRAY FOLEY CATH 14FRSI W/METER (CATHETERS) ×2 IMPLANT
WATER STERILE IRR 1500ML POUR (IV SOLUTION) ×2 IMPLANT
WRAP KNEE MAXI GEL POST OP (GAUZE/BANDAGES/DRESSINGS) ×2 IMPLANT

## 2011-11-12 NOTE — Transfer of Care (Signed)
Immediate Anesthesia Transfer of Care Note  Patient: Steve Lynch  Procedure(s) Performed:  TOTAL KNEE ARTHROPLASTY - Femoral nerve block  Patient Location: PACU  Anesthesia Type: GA combined with regional for post-op pain  Level of Consciousness: awake, alert  and oriented  Airway & Oxygen Therapy: Patient Spontanous Breathing and Patient connected to face mask oxygen  Post-op Assessment: Report given to PACU RN and Post -op Vital signs reviewed and stable  Post vital signs: Reviewed and stable  Complications: No apparent anesthesia complications

## 2011-11-12 NOTE — Op Note (Signed)
NAME:  Yeshua Stryker Macdonnell                      MEDICAL RECORD NO.:  [id number]                             FACILITY:  [facility]      PHYSICIAN:  Madlyn Frankel. Charlann Boxer, M.D.  DATE OF BIRTH:  18-Feb-1956      DATE OF PROCEDURE:  11/12/2011                                     OPERATIVE REPORT         PREOPERATIVE DIAGNOSIS:  Right knee osteoarthritis.      POSTOPERATIVE DIAGNOSIS:  Right knee osteoarthritis.      FINDINGS:  The patient was noted to have complete loss of cartilage and   bone-on-bone arthritis with associated osteophytes in the medial and patellofemoral compartments of   the knee with a significant synovitis and associated effusion.      PROCEDURE:  Right total knee replacement.      COMPONENTS USED:  DePuy rotating platform posterior stabilized knee   system, a size 6 femur, 5 tibia, 12.5 mm insert, and 41 patellar   button.      SURGEON:  Madlyn Frankel. Charlann Boxer, M.D.      ASSISTANT:  Leilani Able, PA-C.      ANESTHESIA:  General and Regional.      SPECIMENS:  None.      COMPLICATION:  None.      DRAINS:  One Hemovac.  EBL: 100cc      TOURNIQUET TIME:   Total Tourniquet Time Documented: Thigh (Right) - 41 minutes .      The patient was stable to the recovery room.      INDICATION FOR PROCEDURE:  Simmie Camerer Forst is a 56 y.o. male patient of   mine.  The patient had been seen, evaluated, and treated conservatively in the   office with medication, activity modification, and injections.  The patient had   radiographic changes of bone-on-bone arthritis with endplate sclerosis and osteophytes noted.  He had successfully made it through his left TKR.     The patient failed conservative measures including medication, injections, and activity modification, and at this point was ready for more definitive measures.   Based on the radiographic changes and failed conservative measures, the patient   decided to proceed with total knee replacement.  Risks of infection,   DVT, component  failure, need for revision surgery, postop course, and   expectations were all   discussed and reviewed.  Consent was obtained for benefit of pain   relief.      PROCEDURE IN DETAIL:  The patient was brought to the operative theater.   Once adequate anesthesia, preoperative antibiotics, 2 gm of Ancef administered, the patient was positioned supine with the right thigh tourniquet placed.  The  right lower extremity was prepped and draped in sterile fashion.  A time-   out was performed identifying the patient, planned procedure, and   extremity.      The right lower extremity was placed in the [facility] leg holder.  The leg was   exsanguinated, tourniquet elevated to 250 mmHg.  A midline incision was   made followed by median parapatellar arthrotomy.  Following initial  exposure, attention was first directed to the patella.  Precut   measurement was noted to be 26 mm.  I resected down to 15 mm and used a   41 patellar button to restore patellar height as well as cover the cut   surface.      The lug holes were drilled and a metal shim was placed to protect the   patella from retractors and saw blades.      At this point, attention was now directed to the femur.  The femoral   canal was opened with a drill, irrigated to try to prevent fat emboli.  An   intramedullary rod was passed at 5 degrees valgus, 10 mm of bone was   resected off the distal femur.  Following this resection, the tibia was   subluxated anteriorly.  Using the extramedullary guide, 10 mm of bone was resected off   the proximal lateral tibia.  We confirmed the gap would be   stable medially and laterally with a 10 mm insert as well as confirmed   the cut was perpendicular in the coronal plane, checking with an alignment rod.      Once this was done, I sized the femur to be a size 6 in the anterior-   posterior dimension, chose a standard component based on medial and   lateral dimension.  The size 6 rotation block was  then pinned in   position anterior referenced using the C-clamp to set rotation.  The   anterior, posterior, and  chamfer cuts were made without difficulty nor   notching making certain that I was along the anterior cortex to help   with flexion gap stability.      The final box cut was made off the lateral aspect of distal femur.      At this point, the tibia was sized to be a size 5, the size 5 tray was   then pinned in position through the medial third of the tubercle,   drilled, and keel punched.  Trial reduction was now carried with a 6 femur,  5 tibia, a 12.5 mm insert, and the 41 patella botton.  The knee was brought to   extension, full extension with good flexion stability with the patella   tracking through the trochlea without application of pressure.  Given   all these findings, the trial components removed.  Final components were   opened and cement was mixed.  The knee was irrigated with normal saline   solution and pulse lavage.  The synovial lining was   then injected with 0.25% Marcaine with epinephrine and 1 cc of Toradol,   total of 61 cc.      The knee was irrigated.  Final implants were then cemented onto clean and   dried cut surfaces of bone with the knee brought to extension with a 12.5   mm trial insert.      Once the cement had fully cured, the excess cement was removed   throughout the knee.  I confirmed I was satisfied with the range of   motion and stability, and the final 12.5 mm insert was chosen.  It was   placed into the knee.      The tourniquet had been let down at 40 minutes.  No significant   hemostasis required.  The medium Hemovac drain was placed deep.  The   extensor mechanism was then reapproximated using #1 Vicryl with the knee   in  flexion.  The   remaining wound was closed with 2-0 Vicryl and running 4-0 Monocryl.   The knee was cleaned, dried, dressed sterilely using Dermabond and   Aquacel dressing.  Drain site dressed separately.  The  patient was then   brought to recovery room in stable condition, tolerating the procedure   well.   Please note that Physician Assistant, Leilani Able, was present for the entirety of the case, and was utilized for pre-operative positioning, peri-operative retractor management, general facilitation of the procedure.  He was also utilized for primary wound closure at the end of the case.              Madlyn Frankel Charlann Boxer, M.D.

## 2011-11-12 NOTE — Anesthesia Postprocedure Evaluation (Signed)
  Anesthesia Post-op Note  Patient: Steve Lynch  Procedure(s) Performed:  TOTAL KNEE ARTHROPLASTY - Femoral nerve block  Patient Location: PACU  Anesthesia Type: General  Level of Consciousness: awake and alert   Airway and Oxygen Therapy: Patient Spontanous Breathing  Post-op Pain: mild  Post-op Assessment: Post-op Vital signs reviewed, Patient's Cardiovascular Status Stable, Respiratory Function Stable, Patent Airway and No signs of Nausea or vomiting  Post-op Vital Signs: stable  Complications: No apparent anesthesia complications

## 2011-11-12 NOTE — Preoperative (Signed)
Beta Blockers   Reason not to administer Beta Blockers:Not Applicable 

## 2011-11-12 NOTE — Interval H&P Note (Signed)
History and Physical Interval Note:  11/12/2011 6:40 AM  Steve Lynch  has presented today for surgery, with the diagnosis of Osteoarthritis Right Knee  The various methods of treatment have been discussed with the patient and family. After consideration of risks, benefits and other options for treatment, the patient has consented to  Procedure(s): RIGHT TOTAL KNEE ARTHROPLASTY as a surgical intervention .  The patients' history has been reviewed, patient examined, no change in status, stable for surgery.  I have reviewed the patients' chart and labs.  Questions were answered to the patient's satisfaction.     Shelda Pal

## 2011-11-12 NOTE — Anesthesia Procedure Notes (Signed)
Anesthesia Regional Block:  Femoral nerve block  Pre-Anesthetic Checklist: ,, timeout performed, Correct Patient, Correct Site, Correct Laterality, Correct Procedure, Correct Position, site marked, Risks and benefits discussed,  Surgical consent,  Pre-op evaluation,  At surgeon's request and post-op pain management  Laterality: Right  Prep: chloraprep       Needles:  Injection technique: Single-shot  Needle Type: Stimiplex     Needle Length:cm 9 cm Needle Gauge: 21 G    Additional Needles:  Procedures: ultrasound guided Femoral nerve block Narrative:  Start time: 11/12/2011 7:11 AM End time: 11/12/2011 7:21 AM Injection made incrementally with aspirations every 30 mL.  Performed by: Personally  Anesthesiologist: Eilene Ghazi MD  Additional Notes: Patient tolerated the procedure well without complications  Femoral nerve block

## 2011-11-12 NOTE — Anesthesia Preprocedure Evaluation (Addendum)
Anesthesia Evaluation  Patient identified by MRN, date of birth, ID band Patient awake    Reviewed: Allergy & Precautions, H&P , NPO status , Patient's Chart, lab work & pertinent test results  Airway Mallampati: II TM Distance: >3 FB Neck ROM: Full    Dental No notable dental hx.    Pulmonary neg pulmonary ROS,  clear to auscultation  Pulmonary exam normal       Cardiovascular neg cardio ROS Regular Normal    Neuro/Psych Negative Neurological ROS  Negative Psych ROS   GI/Hepatic Neg liver ROS, GERD-  ,  Endo/Other  Morbid obesity  Renal/GU negative Renal ROS  Genitourinary negative   Musculoskeletal negative musculoskeletal ROS (+)   Abdominal   Peds negative pediatric ROS (+)  Hematology negative hematology ROS (+)   Anesthesia Other Findings   Reproductive/Obstetrics negative OB ROS                           Anesthesia Physical Anesthesia Plan  ASA: II  Anesthesia Plan: General and Regional   Post-op Pain Management:    Induction: Intravenous  Airway Management Planned: Oral ETT  Additional Equipment:   Intra-op Plan:   Post-operative Plan: Extubation in OR  Informed Consent: I have reviewed the patients History and Physical, chart, labs and discussed the procedure including the risks, benefits and alternatives for the proposed anesthesia with the patient or authorized representative who has indicated his/her understanding and acceptance.   Dental advisory given  Plan Discussed with: CRNA  Anesthesia Plan Comments:        Anesthesia Quick Evaluation

## 2011-11-13 LAB — BASIC METABOLIC PANEL
GFR calc Af Amer: >90 mL/min (ref >90)
GFR calc non Af Amer: >90 mL/min (ref >90)
Potassium: 3.6 mEq/L (ref 3.5–5.1)
Sodium: 138 mEq/L (ref 135–145)

## 2011-11-13 LAB — CBC
Hemoglobin: 10.3 g/dL — ABNORMAL LOW (ref 13.0–17.0)
MCHC: 34.8 g/dL (ref 30.0–36.0)
Platelets: 198 10*3/uL (ref 150–400)
RDW: 12.7 % (ref 11.5–15.5)

## 2011-11-13 MED ORDER — HYDROCODONE-ACETAMINOPHEN 7.5-325 MG PO TABS
1.0000 | ORAL_TABLET | ORAL | Status: DC
  Administered 2011-11-13: 2 via ORAL
  Administered 2011-11-13: 1 via ORAL
  Administered 2011-11-13 – 2011-11-14 (×3): 2 via ORAL
  Administered 2011-11-14: 1 via ORAL
  Administered 2011-11-14 (×2): 2 via ORAL
  Filled 2011-11-13 (×5): qty 2
  Filled 2011-11-13: qty 1
  Filled 2011-11-13 (×2): qty 2

## 2011-11-13 NOTE — Progress Notes (Signed)
Subjective: 1 Day Post-Op Procedure(s) (LRB): TOTAL KNEE ARTHROPLASTY (Right)   Patient reports pain as mild. No events.  Objective:   VITALS:   Filed Vitals:   11/13/11 0555  BP: 110/68  Pulse: 59  Temp: 97.9 F (36.6 C)  Resp: 16    Neurovascular intact Dorsiflexion/Plantar flexion intact Incision: dressing C/D/I No cellulitis present Compartment soft  LABS  Basename 11/13/11 0425  HGB 10.3*  HCT 29.6*  WBC 11.2*  PLT 198     Basename 11/13/11 0425  NA 138  K 3.6  BUN 18  CREATININE 0.79  GLUCOSE 145*     Assessment/Plan: 1 Day Post-Op Procedure(s) (LRB): TOTAL KNEE ARTHROPLASTY (Right)   Foley cath d/c'ed HV drain d/c'ed Advance diet Up with therapy D/C IV fluids Discharge to SNF [facility]) upon discharge   Anastasio Auerbach. Ramiyah Mcclenahan   PAC  11/13/2011, 8:57 AM

## 2011-11-13 NOTE — Progress Notes (Signed)
FL2 in shadow chart for MD signature. Pt plans to have ST rehab at [facility]. D/C plan confirmed with SNF. [facility] is working with [employer] for prior approval. CSW will follow to assist with insurance authorization process and d/c planning to SNF.

## 2011-11-13 NOTE — Progress Notes (Signed)
OT Screen Order received, chart reviewed. Spoke briefly with patient and wife. Pt has had other knee replaced and plans to d/c to SNF. Presents with no OT needs in acute. Will sign off and defer OT eval to that venue.  Garrel Ridgel, OTR/L  Pager [phone] 11/13/2011

## 2011-11-13 NOTE — Evaluation (Signed)
Physical Therapy Evaluation Patient Details Name: Steve Lynch MRN: [id number] DOB: [birth year] Today's Date: 11/13/[phone]           ev2 Problem List:  Patient Active Problem List  Diagnoses  . S/P right knee replacement    Past Medical History:  Past Medical History  Diagnosis Date  . Peripheral vascular disease     varicose veins right leg  . Tendonitis     temporal tendonitis as per pt 1/12 with locked jaw following tooth extraction   Past Surgical History:  Past Surgical History  Procedure Date  . Schleratherapy     right leg  . Joint replacement   . Hernia repair     1/12   left knee  . Vascular surgery   . Back surgery     "DISC  REPLACEMENT"   L5-6   2005  . Tonsillectomy     PT Assessment/Plan/Recommendation PT Assessment Clinical Impression Statement: pt s/p RTKA tolerated bed activity, became dizzy and had to sit down. pt plans SNF, will benefit from PT to improve ROM, strength, mobility to DC to SNF PT Recommendation/Assessment: Patient will need skilled PT in the acute care venue PT Problem List: Decreased strength;Decreased range of motion;Decreased activity tolerance;Decreased mobility;Decreased knowledge of use of DME;Decreased safety awareness;Decreased knowledge of precautions;Pain;Cardiopulmonary status limiting activity PT Therapy Diagnosis : Difficulty walking;Acute pain PT Plan PT Frequency: 7X/week PT Recommendation Follow Up Recommendations: Skilled nursing facility Equipment Recommended: Defer to next venue PT Goals  Acute Rehab PT Goals PT Goal Formulation: With patient Time For Goal Achievement: 7 days Pt will go Supine/Side to Sit: with supervision PT Goal: Supine/Side to Sit - Progress: Goal set today Pt will go Sit to Supine/Side: with supervision PT Goal: Sit to Supine/Side - Progress: Goal set today Pt will go Sit to Stand: with supervision PT Goal: Sit to Stand - Progress: Goal set today Pt will go Stand to Sit: with  supervision PT Goal: Stand to Sit - Progress: Goal set today Pt will Ambulate: >150 feet;with supervision;with rolling walker PT Goal: Ambulate - Progress: Goal set today Pt will Perform Home Exercise Program: with supervision, verbal cues required/provided PT Goal: Perform Home Exercise Program - Progress: Goal set today  PT Evaluation Precautions/Restrictions  Precautions Precautions: Knee Required Braces or Orthoses: Yes Restrictions Weight Bearing Restrictions: No Prior Functioning  Home Living Lives With: Spouse Receives Help From: Family Type of Home: House Home Access: Stairs to enter Entrance Stairs-Rails: None Entrance Stairs-Number of Steps: 2 Home Adaptive Equipment:  (borrowed RW after rehab) Prior Function Level of Independence: Independent with basic ADLs Able to Take Stairs?: Yes Vocation: Full time employment Cognition  [facility] Sensation/Coordination Sensation Light Touch: Appears Intact Coordination Gross Motor Movements are Fluid and Coordinated: Yes Extremity Assessment  RLE AROM (degrees) RLE Overall AROM Comments: tol;erated 30 degrees flex of knee, limited by pain RLE Strength RLE Overall Strength Comments: SLR with min A LLE Assessment LLE Assessment: Within Functional Limits Mobility (including Balance) Bed Mobility Bed Mobility: Yes Supine to Sit: 4: Min assist;HOB elevated (Comment degrees) Supine to Sit Details (indicate cue type and reason): pt used jumprope from home to move RLE Transfers Transfers: Yes Sit to Stand: 4: Min assist;From elevated surface;With upper extremity assist;From bed Sit to Stand Details (indicate cue type and reason): vxc for hand placement Stand to Sit: 4: Min assist;With armrests;To chair/3-in-1 Stand to Sit Details: vc to reach back to chair, RLE position Ambulation/Gait Ambulation/Gait: Yes Ambulation/Gait Assistance: 4: Min assist  Ambulation/Gait Assistance Details (indicate cue type and reason): vc for  posture Ambulation Distance (Feet): 10 Feet Assistive device: Rolling walker Gait Pattern: Step-to pattern    Exercise    End of Session PT - End of Session Equipment Utilized During Treatment: Right knee immobilizer Activity Tolerance:  (limited by dizziness) Patient left: in chair;with [name] bell in reach Nurse Communication: Mobility status for transfers (dizziness, BP106/55) General Behavior During Session: [facility] for tasks performed Cognition: [facility] for tasks performed  Steve Lynch 11/13/2011, 9:37 AM

## 2011-11-14 ENCOUNTER — Encounter (HOSPITAL_COMMUNITY): Payer: Self-pay | Admitting: Orthopedic Surgery

## 2011-11-14 LAB — BASIC METABOLIC PANEL
CO2: 26 mEq/L (ref 19–32)
GFR calc non Af Amer: >90 mL/min (ref >90)
Glucose, Bld: 101 mg/dL — ABNORMAL HIGH (ref 70–99)
Potassium: 3.8 mEq/L (ref 3.5–5.1)
Sodium: 138 mEq/L (ref 135–145)

## 2011-11-14 LAB — CBC
Hemoglobin: 9.8 g/dL — ABNORMAL LOW (ref 13.0–17.0)
Platelets: 163 10*3/uL (ref 150–400)
RBC: 3.17 MIL/uL — ABNORMAL LOW (ref 4.22–5.81)
WBC: 8.8 10*3/uL (ref 4.0–10.5)

## 2011-11-14 MED ORDER — FERROUS SULFATE 325 (65 FE) MG PO TABS: 325.0000 mg | ORAL_TABLET | Freq: Three times a day (TID) | ORAL | Status: DC

## 2011-11-14 MED ORDER — HYDROCODONE-ACETAMINOPHEN 7.5-325 MG PO TABS: 1.0000 | ORAL_TABLET | ORAL | Status: AC

## 2011-11-14 MED ORDER — POLYETHYLENE GLYCOL 3350 17 G PO PACK: 17.0000 g | PACK | Freq: Every day | ORAL | Status: AC | PRN

## 2011-11-14 MED ORDER — METHOCARBAMOL 500 MG PO TABS: 500.0000 mg | ORAL_TABLET | Freq: Four times a day (QID) | ORAL | Status: AC | PRN

## 2011-11-14 MED ORDER — ASPIRIN EC 325 MG PO TBEC: 325.0000 mg | DELAYED_RELEASE_TABLET | Freq: Two times a day (BID) | ORAL | Status: AC

## 2011-11-14 MED ORDER — DSS 100 MG PO CAPS: 100.0000 mg | ORAL_CAPSULE | Freq: Two times a day (BID) | ORAL | Status: AC

## 2011-11-14 NOTE — Progress Notes (Addendum)
Physical Therapy Treatment Patient Details Name: Craige Patel Stotler MRN: [id number] DOB: 1955-11-16 Today's Date: 11/14/[phone] e g [place] PT Assessment/Plan  PT - Assessment/Plan Comments on Treatment Session: pt improved with pain control, continues to have more dicfficulty with sit-stand.  pt plans rehab today. Noted edema entire RLE, TEDs placed, encouraged pt to have assistance to elevate LE when in bed. PT Plan: Discharge plan remains appropriate;Frequency remains appropriate PT Frequency: 7X/week Follow Up Recommendations: Skilled nursing facility Equipment Recommended: Defer to next venue PT Goals  Acute Rehab PT Goals Time For Goal Achievement: 7 days Pt will go Supine/Side to Sit: with supervision PT Goal: Supine/Side to Sit - Progress: Progressing toward goal Pt will go Sit to Stand: with supervision PT Goal: Sit to Stand - Progress: Progressing toward goal Pt will go Stand to Sit: with supervision PT Goal: Stand to Sit - Progress: Progressing toward goal Pt will Ambulate: >150 feet;with supervision;with rolling walker PT Goal: Ambulate - Progress: Progressing toward goal Pt will Perform Home Exercise Program: with supervision, verbal cues required/provided PT Goal: Perform Home Exercise Program - Progress: Progressing toward goal  PT Treatment Precautions/Restrictions  Precautions Precautions: Knee Required Braces or Orthoses:  (did not use KI) Restrictions Weight Bearing Restrictions: No Mobility (including Balance) Bed Mobility Supine to Sit: 4: Min assist;With rails;HOB elevated (Comment degrees) Supine to Sit Details (indicate cue type and reason): pt used jumprope, HOB 60 degr.  w/. rail and min A to get to EOB Transfers Sit to Stand: 4: Min assist;From elevated surface;With upper extremity assist;From bed Sit to Stand Details (indicate cue type and reason): Bed raised to assist to stand, vc to push w/UEs Stand to Sit: 4: Min assist;With armrests;To  chair/3-in-1 Stand to Sit Details: pt able to step RLE forward w/vc Ambulation/Gait Ambulation/Gait Assistance: 4: Min assist Ambulation/Gait Assistance Details (indicate cue type and reason): vc for safety w/ ni KI, posture Ambulation Distance (Feet): 90 Feet Assistive device: Rolling walker Gait Pattern: Step-to pattern    Exercise  Total Joint Exercises Ankle Circles/Pumps: AROM;Right;10 reps Quad Sets: AROM;Right;10 reps Heel Slides: AAROM;Right;10 reps Hip ABduction/ADduction: AAROM;Right;10 reps Straight Leg Raises: AAROM;Right;10 reps Knee Flexion: AROM;Right;10 reps;Seated End of Session PT - End of Session Activity Tolerance: Patient tolerated treatment well Patient left: in chair;with [place] bell in reach Nurse Communication: Mobility status for transfers  [name] 11/14/2011, 9:35 AM

## 2011-11-14 NOTE — Discharge Summary (Signed)
Physician Discharge Summary  Patient ID: Steve Lynch MRN: [id number] DOB/AGE: 05/29/1956 56 y.o.  Admit date: 11/12/2011 Discharge date: 11/14/2011  Procedures:  Procedure(s) (LRB): TOTAL KNEE ARTHROPLASTY (Right)  Attending Physician: Shelda Pal, MD   Admission Diagnoses: Right knee OA and pain     Discharge Diagnoses:  Principal Problem:  *S/P right knee replacement Peripheral vascular disease  Tendonitis   HPI: Pt is a 56 y.o. male complaining of right knee pain for 6 years. Pain had continually increased since the beginning. Has a history of a previous left TKA. X-rays in the clinic show end-stage arthritic changes of the right knee. Pt has tried various conservative treatments which have failed to alleviate their symptoms, including steroid injections and NSAIDs. Various options are discussed with the patient. Risks, benefits and expectations were discussed with the patient. Patient understand the risks, benefits and expectations and wishes to proceed with surgery.    PCP: No primary provider on file.   Discharged Condition: good  Hospital Course:  Patient underwent the above stated procedure on 11/12/2011. Patient tolerated the procedure well and brought to the recovery room in good condition and subsequently to the floor.  POD #1 BP: 110/68 ; Pulse: 59 ; Temp: 97.9 F (36.6 C) ; Resp: 16  Pt's foley was removed, as well as the hemovac drain removed. IV was changed to a saline lock. Patient reports pain as mild. No events. Neurovascular intact, dorsiflexion/plantar flexion intact, incision: dressing C/D/I, no cellulitis present and compartment soft.  LABS  Basename  11/13/11 0425   HGB  10.3*   HCT  29.6*    POD #2  BP: 116/73 ; Pulse: 63 ; Temp: 98 F (36.7 C) ; Resp: 20  Patient reports pain as mild. No events. Ready to be discharged to SNF. Neurovascular intact, dorsiflexion/plantar flexion intact, incision: dressing C/D/I, no cellulitis present and compartment  soft.  LABS  Basename  11/14/11 0423    HGB  9.8*    HCT  28.0*      Discharge Exam: Extremities: Homans sign is negative, no sign of DVT, no edema, redness or tenderness in the calves or thighs and no ulcers, gangrene or trophic changes  Disposition:  SNF  with follow up in 2 weeks  Follow-up Information    Follow up with OLIN,Kynzlee Hucker D in 2 weeks.   Contact information:   [facility] [address] [place] [address] [phone]         Discharge Orders    Future Orders Please Complete By Expires   Diet - low sodium heart healthy      [place] MD / [place] 911      Comments:   If you experience chest pain or shortness of breath, [place] 911 and be transported to the hospital emergency room.  If you develope a fever above 101 F, pus (white drainage) or increased drainage or redness at the wound, or calf pain, [place] your surgeon's office.   Constipation Prevention      Comments:   Drink plenty of fluids.  Prune juice may be helpful.  You may use a stool softener, such as Colace (over the counter) 100 mg twice a day.  Use MiraLax (over the counter) for constipation as needed.   Increase activity slowly as tolerated      Weight Bearing as taught in Physical Therapy      Comments:   Use a walker or crutches as instructed.   Driving restrictions  Comments:   No driving for 4 weeks   TED hose      Comments:   Use stockings (TED hose) for 2 weeks on both leg(s).  You may remove them at night for sleeping.   Change dressing      Comments:   Maintain surgical dressing for 8 days, then change the dressing daily with sterile 4 x 4 inch gauze dressing and tape. Keep the area dry and clean.   Discharge instructions      Comments:   Maintain surgical dressing for 8 days, then replace with gauze and tape. Keep the area dry and clean until follow up. Follow up in 2 weeks at [facility]. [place] with any questions or concerns.  PT Note:  Along with working on the right knee, evaluate and treat the left knee as well. Working on ROM, strengthening and ambulation for bilateral knees, s/p TKAs.         Current Discharge Medication List    START taking these medications   Details  aspirin EC 325 MG tablet Take 1 tablet (325 mg total) by mouth 2 (two) times daily. X 4 weeks Qty: 60 tablet, Refills: 0    docusate sodium 100 MG CAPS Take 100 mg by mouth 2 (two) times daily.    ferrous sulfate 325 (65 FE) MG tablet Take 1 tablet (325 mg total) by mouth 3 (three) times daily after meals.    HYDROcodone-acetaminophen (NORCO) 7.5-325 MG per tablet Take 1-2 tablets by mouth every 4 (four) hours. Qty: 120 tablet, Refills: 0    methocarbamol (ROBAXIN) 500 MG tablet Take 1 tablet (500 mg total) by mouth every 6 (six) hours as needed (muscle spasms). Qty: 50 tablet, Refills: 0    polyethylene glycol (MIRALAX / GLYCOLAX) packet Take 17 g by mouth daily as needed.      CONTINUE these medications which have NOT CHANGED   Details  fish oil-omega-3 fatty acids 1000 MG capsule Take 1 g by mouth daily.    ranitidine (ZANTAC) 150 MG tablet Take 150 mg by mouth daily as needed. Heart burn       STOP taking these medications     etodolac (LODINE) 400 MG tablet Comments:  Reason for Stopping:       ibuprofen (ADVIL,MOTRIN) 200 MG tablet Comments:  Reason for Stopping:          Signed: Anastasio Auerbach. Silver Parkey   PAC  11/14/2011, 8:48 AM

## 2011-11-14 NOTE — Progress Notes (Signed)
SNF bed available at [facility] once [employer] provides prior approval. Pt aware no approval received at this time. Awaiting Jobe back from [facility] with update on insurance status.

## 2011-11-14 NOTE — Progress Notes (Signed)
Subjective: 2 Days Post-Op Procedure(s) (LRB): TOTAL KNEE ARTHROPLASTY (Right)   Patient reports pain as mild. No events. Ready to be discharged to SNF.  Objective:   VITALS:   Filed Vitals:   11/14/11 0540  BP: 116/73  Pulse: 63  Temp: 98 F (36.7 C)  Resp: 20    Neurovascular intact Dorsiflexion/Plantar flexion intact Incision: dressing C/D/I No cellulitis present Compartment soft  LABS  Basename 11/14/11 0423 11/13/11 0425  HGB 9.8* 10.3*  HCT 28.0* 29.6*  WBC 8.8 11.2*  PLT 163 198     Basename 11/14/11 0423 11/13/11 0425  NA 138 138  K 3.8 3.6  BUN 17 18  CREATININE 0.88 0.79  GLUCOSE 101* 145*     Assessment/Plan: 2 Days Post-Op Procedure(s) (LRB): TOTAL KNEE ARTHROPLASTY (Right)   Up with therapy Discharge to SNF Follow up in 2 weeks,  Follow-up Information    Follow up with OLIN,Kanchan Gal D in 2 weeks.   Contact information:   [facility] [address], [address] [place] [address] [phone]          Anastasio Auerbach. Jakeim Sedore   PAC  11/14/2011, 8:18 AM

## 2011-11-14 NOTE — Progress Notes (Signed)
  CARE MANAGEMENT NOTE 11/14/2011  Patient:  Steve Lynch, Steve Lynch   Account Number:  [id number]  Date Initiated:  11/14/2011  Documentation initiated by:  Colleen Can  Subjective/Objective Assessment:   DX osteoarthritis right knee: total knee replacement     Action/Plan:   CM spoke with patient and palns are for [facility] SNF   Anticipated DC Date:  11/14/2011   Anticipated DC Plan:  SKILLED NURSING FACILITY  In-house referral  Clinical Social Worker      DC Planning Services  CM consult      [facility] Choice  NA   Choice offered to / List presented to:  NA   DME arranged  NA      DME agency  NA     HH arranged  NA      HH agency  NA   Status of service:  Completed, signed off Medicare Important Message given?  NO (If response is "NO", the following Medicare IM given date fields will be blank)

## 2011-12-13 HISTORY — PX: COLONOSCOPY: SHX174

## 2011-12-16 ENCOUNTER — Encounter: Payer: Self-pay | Admitting: Orthopedic Surgery

## 2012-01-07 LAB — HM COLONOSCOPY

## 2012-01-13 ENCOUNTER — Encounter: Payer: Self-pay | Admitting: Orthopedic Surgery

## 2012-02-12 ENCOUNTER — Encounter: Payer: Self-pay | Admitting: Orthopedic Surgery

## 2013-08-09 ENCOUNTER — Telehealth: Payer: Self-pay | Admitting: Family Medicine

## 2013-08-09 NOTE — Telephone Encounter (Signed)
Pt's wife called and is trying to est pt w/you as his PCP.   Your first new pt apptmt is October 27, 2012, however, pt has had a poss sinus infection or something going on that OTC meds are not helping for the last 4-5 weeks.  Wife wants to know if you can work him in to be seen sooner. Can you accommodate him? Thank you.

## 2013-08-09 NOTE — Telephone Encounter (Signed)
Left mssg for pt to return [place].

## 2013-08-09 NOTE — Telephone Encounter (Signed)
May place in 15 min slot just for sinus infection, but will need to keep new appt to fully establish

## 2013-08-16 ENCOUNTER — Encounter: Payer: Self-pay | Admitting: Family Medicine

## 2013-08-16 ENCOUNTER — Ambulatory Visit (INDEPENDENT_AMBULATORY_CARE_PROVIDER_SITE_OTHER): Payer: BC Managed Care – PPO | Admitting: Family Medicine

## 2013-08-16 VITALS — BP 126/84 | HR 100 | Temp 98.1°F | Ht 76.0 in | Wt 314.2 lb

## 2013-08-16 DIAGNOSIS — R05 Cough: Secondary | ICD-10-CM | POA: Insufficient documentation

## 2013-08-16 DIAGNOSIS — R053 Chronic cough: Secondary | ICD-10-CM | POA: Insufficient documentation

## 2013-08-16 DIAGNOSIS — R5383 Other fatigue: Secondary | ICD-10-CM | POA: Insufficient documentation

## 2013-08-16 DIAGNOSIS — R5381 Other malaise: Secondary | ICD-10-CM

## 2013-08-16 DIAGNOSIS — R059 Cough, unspecified: Secondary | ICD-10-CM

## 2013-08-16 MED ORDER — FLUTICASONE PROPIONATE 50 MCG/ACT NA SUSP: 2.0000 | Freq: Every day | NASAL | Status: DC

## 2013-08-16 NOTE — Telephone Encounter (Signed)
Pt sch for 08/16/2013 for acute and 09/20/2013 for new pt

## 2013-08-16 NOTE — Patient Instructions (Signed)
I think this is allergic cough. Treat with claritin or zyrtec daily, and start flonase. Let me know how this is doing. Keep appointment early Jan. Good to meet you today, Ungerer [place] with questions.

## 2013-08-16 NOTE — Progress Notes (Signed)
  Subjective:    Patient ID: Steve Lynch, male    DOB: 1956/08/01, 57 y.o.   MRN: [id number]  HPI CC: cough  Non productive cough for last 2 months. Symptoms worse in morning with coughing spells and associated ocassional post tussive emesis. Symptoms persist intermittently throughout day. Tried Allegra D and another OTC allergy med for about 2 weeks with no relief. Increased fatigue in afternoon. Feels that the morning coughing spells decrease when he stays inside in mornings.   Similar symptoms in Spring and told it was allergies but does not remember how sx were treated.   No hx asthma, allergies, COPD, acid reflux, smoking.   Review of Systems  Constitutional: Positive for fatigue. Negative for fever and chills.  HENT: Positive for postnasal drip. Negative for congestion, ear pain, rhinorrhea, sinus pressure, sneezing and sore throat.        Ear itching L>R  Eyes: Negative for discharge and itching.  Respiratory: Positive for cough. Negative for chest tightness, shortness of breath and wheezing.   Cardiovascular: Negative for chest pain.  Neurological: Negative for dizziness and light-headedness.       Objective:   Physical Exam  Constitutional: He appears well-developed and well-nourished. No distress.  HENT:  Head: Normocephalic and atraumatic.  Right Ear: Tympanic membrane, external ear and ear canal normal.  Left Ear: Tympanic membrane, external ear and ear canal normal.  Nose: Mucosal edema present.  Mouth/Throat: Posterior oropharyngeal erythema present. No oropharyngeal exudate.  Bilateral cerumen  Eyes: Conjunctivae and EOM are normal. Pupils are equal, round, and reactive to light. Right eye exhibits no discharge. Left eye exhibits no discharge. No scleral icterus.  Neck: Normal range of motion. Neck supple.  Cardiovascular: Regular rhythm and normal heart sounds.   Pulmonary/Chest: Effort normal and breath sounds normal. No respiratory distress. He has no wheezes.   Lymphadenopathy:    He has no cervical adenopathy.  Skin: He is not diaphoretic.       Assessment & Plan:  Allergic rhinitis associated cough Try OTC antihistimine such as Zyrtec or Claritin.  Flonase 2 sprays into each nostril once a day.  Let us know if symptoms don't improve. Schedule annual physical

## 2013-08-16 NOTE — Assessment & Plan Note (Addendum)
Anticipate allergic rhinitis related cough - treat with claritin or zyrtec, and start intranasal steroid. Pt agrees with plan.

## 2013-08-16 NOTE — Progress Notes (Signed)
Patient seen and examined with PA student Ricarda Frame.  Note reviewed, agree with assessment and plan unless changes documented in my note.   CC: cough  New pt to establish.  From [employer].  Nonproductive cough going on for last 2 months.  +PNDrainage. H/o similar cough in spring - presumed due to allergies. No sinus pressure, ear pain, congestion, dyspnea, wheezing. Cough worse in am then intermittently persistent throughout the day. Cough seems to improve if he avoids outdoor exposure. Coughing fits to the point of gagging.  Sweating from coughing.  No h/o smoking, GERD, COPD, asthma.  Has tried allegra D x3 boxes without improvement.  Medications and allergies reviewed and updated in chart.  Past histories reviewed and updated if relevant as below. Patient Active Problem List   Diagnosis Date Noted  . S/P right knee replacement 11/12/2011   Past Medical History  Diagnosis Date  . Peripheral vascular disease 2004    varicose veins right leg  . Tendonitis 10/2010    temporal tendonitis with locked jaw following tooth extraction  . Drug abuse in remission 1990s    Cocaine with rehab   Past Surgical History  Procedure Laterality Date  . Schleratherapy      right leg  . Hernia repair      1/12   left knee  . Vascular surgery    . Back surgery  2005    "DISC  REPLACEMENT"   L5-6   . Tonsillectomy    . Total knee arthroplasty  11/12/2011    Procedure: TOTAL KNEE ARTHROPLASTY;  Surgeon: Shelda Pal, MD; Laterality: Right  . Knee arthroplasty Left 2012   History  Substance Use Topics  . Smoking status: Never Smoker   . Smokeless tobacco: Never Used  . Alcohol Use: Yes     Comment: Very rare   Family History  Problem Relation Age of Onset  . Hyperlipidemia Mother   . Hypertension Mother   . Cancer Neg Hx   . Diabetes Mother   . CAD Neg Hx   . Stroke Neg Hx    No Known Allergies Current Outpatient Prescriptions on File Prior to Visit  Medication Sig  Dispense Refill  . fish oil-omega-3 fatty acids 1000 MG capsule Take 1 g by mouth daily.      . ranitidine (ZANTAC) 150 MG tablet Take 150 mg by mouth daily as needed. Heart burn        No current facility-administered medications on file prior to visit.     ROS: per HPI  PE: GEN: WDWN CM, NAD HEENT: posterior oropharygeal drainage, slightly swollen turbinates, TMs and ear canals clear Neck: no cervical LAD Pulm: CTAB without wheezing, crackles CVS: nl S1, S2, no m/r/g Ext: no edema Skin: no rash

## 2013-09-13 DIAGNOSIS — E538 Deficiency of other specified B group vitamins: Secondary | ICD-10-CM | POA: Insufficient documentation

## 2013-09-13 HISTORY — DX: Deficiency of other specified B group vitamins: E53.8

## 2013-09-20 ENCOUNTER — Ambulatory Visit: Payer: BC Managed Care – PPO | Admitting: Family Medicine

## 2013-10-01 ENCOUNTER — Other Ambulatory Visit: Payer: Self-pay | Admitting: Family Medicine

## 2013-10-01 DIAGNOSIS — Z125 Encounter for screening for malignant neoplasm of prostate: Secondary | ICD-10-CM

## 2013-10-01 DIAGNOSIS — Z0000 Encounter for general adult medical examination without abnormal findings: Secondary | ICD-10-CM | POA: Insufficient documentation

## 2013-10-01 DIAGNOSIS — R5383 Other fatigue: Secondary | ICD-10-CM

## 2013-10-01 DIAGNOSIS — Z0001 Encounter for general adult medical examination with abnormal findings: Secondary | ICD-10-CM | POA: Insufficient documentation

## 2013-10-04 ENCOUNTER — Other Ambulatory Visit: Payer: BC Managed Care – PPO

## 2013-10-11 ENCOUNTER — Ambulatory Visit (INDEPENDENT_AMBULATORY_CARE_PROVIDER_SITE_OTHER): Payer: BC Managed Care – PPO | Admitting: Family Medicine

## 2013-10-11 ENCOUNTER — Encounter: Payer: Self-pay | Admitting: Family Medicine

## 2013-10-11 VITALS — BP 130/84 | HR 84 | Temp 98.2°F | Ht 76.0 in | Wt 316.0 lb

## 2013-10-11 DIAGNOSIS — R5383 Other fatigue: Secondary | ICD-10-CM

## 2013-10-11 DIAGNOSIS — R5381 Other malaise: Secondary | ICD-10-CM

## 2013-10-11 DIAGNOSIS — Z0000 Encounter for general adult medical examination without abnormal findings: Secondary | ICD-10-CM

## 2013-10-11 NOTE — Assessment & Plan Note (Signed)
Preventative protocols reviewed and updated unless pt declined. Discussed healthy diet and lifestyle.  

## 2013-10-11 NOTE — Patient Instructions (Signed)
Good to see you today, Steve Lynch with questions. Blood work today. return as needed or in 1 year for next physical.

## 2013-10-11 NOTE — Progress Notes (Signed)
Pre-visit discussion using our clinic review tool. No additional management support is needed unless otherwise documented below in the visit note.  

## 2013-10-11 NOTE — Progress Notes (Signed)
Subjective:    Patient ID: Steve Lynch, male    DOB: September 22, 1956, 57 y.o.   MRN: [id number]  HPI CC: CPE  Stays sleepy in afternoons.  Usually skips lunch.  Breakfast at 6-7 am (pop tart) then next meal is dinner at 5pm.  Does keep drink (tea, pepsi, water).  Restorative sleep.  No snoring or apneic episodes.  + daytime somnolence.  Does drink large amt sweet tea and regular pepsi  Weight gain noted. Wt Readings from Last 3 Encounters:  10/11/13 316 lb (143.337 kg)  08/16/13 314 lb 4 oz (142.543 kg)  11/12/11 307 lb (139.254 kg)  Body mass index is 38.48 kg/(m^2).   Seat belt use discussed Sunscreen use discussed, no suspicious moles.  Preventative: Colon cancer screening - latest 2013, rec rpt 10 years [facility]) Prostate cancer screening - no sxs - no nocturia.  Decides to defer screening for now.  May readdress Flu shot - declines Tetanus - declines  Lives with wife, 1 dog Occupation: [employer]: 2 yrs college Activity: plays golf, active at work, fishes and rides motorcycle Diet: some water, fruits/vegetables daily Involved in [employer] on [employer] (based out of [place])  Medications and allergies reviewed and updated in chart.  Past histories reviewed and updated if relevant as below. Patient Active Problem List   Diagnosis Date Noted  . Healthcare maintenance 10/01/2013  . Cough 08/16/2013  . Fatigue 08/16/2013   Past Medical History  Diagnosis Date  . Peripheral vascular disease 2004    varicose veins right leg  . Tendonitis 10/2010    temporal tendonitis with locked jaw following tooth extraction  . Drug abuse in remission 1990s    Cocaine with rehab   Past Surgical History  Procedure Laterality Date  . Schleratherapy      right leg  . Hernia repair      1/12   left knee  . Vascular surgery    . Back surgery  2005    "DISC  REPLACEMENT"   L5-6   . Tonsillectomy    . Total knee arthroplasty  11/12/2011    Procedure:  TOTAL KNEE ARTHROPLASTY;  Surgeon: Shelda Pal, MD; Laterality: Right  . Knee arthroplasty Left 2012   History  Substance Use Topics  . Smoking status: Never Smoker   . Smokeless tobacco: Never Used  . Alcohol Use: Yes     Comment: Very rare   Family History  Problem Relation Age of Onset  . Hyperlipidemia Mother   . Hypertension Mother   . Cancer Neg Hx   . Diabetes Mother   . CAD Neg Hx   . Stroke Neg Hx    No Known Allergies Current Outpatient Prescriptions on File Prior to Visit  Medication Sig Dispense Refill  . fish oil-omega-3 fatty acids 1000 MG capsule Take 1 g by mouth daily.      . fluticasone (FLONASE) 50 MCG/ACT nasal spray Place 2 sprays into the nose daily.  16 g  3  . ranitidine (ZANTAC) 150 MG tablet Take 150 mg by mouth daily as needed. Heart burn        No current facility-administered medications on file prior to visit.     Review of Systems Per HPI    Objective:   Physical Exam  Nursing note and vitals reviewed. Constitutional: He is oriented to person, place, and time. He appears well-developed and well-nourished. No distress.  HENT:  Head: Normocephalic and atraumatic.  Right  Ear: Hearing, tympanic membrane, external ear and ear canal normal.  Left Ear: Hearing, tympanic membrane, external ear and ear canal normal.  Nose: Nose normal.  Mouth/Throat: Oropharynx is clear and moist. No oropharyngeal exudate.  Eyes: Conjunctivae and EOM are normal. Pupils are equal, round, and reactive to light. No scleral icterus.  Neck: Normal range of motion. Neck supple.  Cardiovascular: Normal rate, regular rhythm, normal heart sounds and intact distal pulses.   No murmur heard. Pulses:      Radial pulses are 2+ on the right side, and 2+ on the left side.  Pulmonary/Chest: Effort normal and breath sounds normal. No respiratory distress. He has no wheezes. He has no rales.  Abdominal: Soft. Bowel sounds are normal. He exhibits no distension and no mass.  There is no tenderness. There is no rebound and no guarding.  Musculoskeletal: Normal range of motion. He exhibits no edema.  Lymphadenopathy:    He has no cervical adenopathy.  Neurological: He is alert and oriented to person, place, and time.  CN grossly intact, station and gait intact  Skin: Skin is warm and dry. No rash noted.  Psychiatric: He has a normal mood and affect. His behavior is normal. Judgment and thought content normal.       Assessment & Plan:

## 2013-10-11 NOTE — Assessment & Plan Note (Signed)
Check vitamins as well as cbc, tsh for reversible causes of fatigue Discussed relation of sugar intake and afternoon fatigue.

## 2013-10-12 LAB — TSH: TSH: 2.26 u[IU]/mL (ref 0.35–5.50)

## 2013-10-12 LAB — LIPID PANEL
Total CHOL/HDL Ratio: 4
VLDL: 15.4 mg/dL (ref 0.0–40.0)

## 2013-10-12 LAB — COMPREHENSIVE METABOLIC PANEL
ALT: 22 U/L (ref 0–53)
Albumin: 4.1 g/dL (ref 3.5–5.2)
CO2: 27 mEq/L (ref 19–32)
Chloride: 105 mEq/L (ref 96–112)
GFR: 91.61 mL/min (ref >60.00)
Glucose, Bld: 76 mg/dL (ref 70–99)
Potassium: 4.5 mEq/L (ref 3.5–5.1)
Sodium: 139 mEq/L (ref 135–145)
Total Protein: 7.6 g/dL (ref 6.0–8.3)

## 2013-10-12 LAB — CBC WITH DIFFERENTIAL/PLATELET
Basophils Absolute: 0.1 10*3/uL (ref 0.0–0.1)
Eosinophils Relative: 1.1 % (ref 0.0–5.0)
HCT: 42.9 % (ref 39.0–52.0)
Lymphocytes Relative: 18.2 % (ref 12.0–46.0)
Monocytes Relative: 6.7 % (ref 3.0–12.0)
Neutrophils Relative %: 73.1 % (ref 43.0–77.0)
Platelets: 249 10*3/uL (ref 150.0–400.0)
WBC: 9.8 10*3/uL (ref 4.5–10.5)

## 2013-10-12 LAB — VITAMIN D 25 HYDROXY (VIT D DEFICIENCY, FRACTURES): Vit D, 25-Hydroxy: 37 ng/mL (ref 30–89)

## 2013-10-13 ENCOUNTER — Other Ambulatory Visit: Payer: Self-pay | Admitting: Family Medicine

## 2013-10-13 ENCOUNTER — Encounter: Payer: Self-pay | Admitting: Family Medicine

## 2013-10-13 DIAGNOSIS — E785 Hyperlipidemia, unspecified: Secondary | ICD-10-CM | POA: Insufficient documentation

## 2013-11-03 ENCOUNTER — Ambulatory Visit (INDEPENDENT_AMBULATORY_CARE_PROVIDER_SITE_OTHER): Payer: BC Managed Care – PPO

## 2013-11-03 DIAGNOSIS — E538 Deficiency of other specified B group vitamins: Secondary | ICD-10-CM

## 2013-11-03 DIAGNOSIS — E530 Riboflavin deficiency: Secondary | ICD-10-CM

## 2013-11-03 MED ORDER — CYANOCOBALAMIN 1000 MCG/ML IJ SOLN
1000.0000 ug | Freq: Once | INTRAMUSCULAR | Status: AC
  Administered 2013-11-03: 1000 ug via INTRAMUSCULAR

## 2013-12-08 ENCOUNTER — Ambulatory Visit: Payer: BC Managed Care – PPO

## 2013-12-14 ENCOUNTER — Ambulatory Visit: Payer: BC Managed Care – PPO

## 2013-12-14 DIAGNOSIS — E538 Deficiency of other specified B group vitamins: Secondary | ICD-10-CM

## 2013-12-14 MED ORDER — CYANOCOBALAMIN 1000 MCG/ML IJ SOLN
1000.0000 ug | Freq: Once | INTRAMUSCULAR | Status: AC
  Administered 2013-12-14: 1000 ug via INTRAMUSCULAR

## 2014-01-19 ENCOUNTER — Ambulatory Visit (INDEPENDENT_AMBULATORY_CARE_PROVIDER_SITE_OTHER): Payer: BC Managed Care – PPO

## 2014-01-19 DIAGNOSIS — E538 Deficiency of other specified B group vitamins: Secondary | ICD-10-CM

## 2014-01-19 MED ORDER — CYANOCOBALAMIN 1000 MCG/ML IJ SOLN
1000.0000 ug | Freq: Once | INTRAMUSCULAR | Status: AC
  Administered 2014-01-19: 1000 ug via INTRAMUSCULAR

## 2014-02-15 ENCOUNTER — Ambulatory Visit: Payer: BC Managed Care – PPO

## 2014-02-23 ENCOUNTER — Ambulatory Visit (INDEPENDENT_AMBULATORY_CARE_PROVIDER_SITE_OTHER): Payer: BC Managed Care – PPO

## 2014-02-23 DIAGNOSIS — E538 Deficiency of other specified B group vitamins: Secondary | ICD-10-CM

## 2014-02-23 MED ORDER — CYANOCOBALAMIN 1000 MCG/ML IJ SOLN
1000.0000 ug | Freq: Once | INTRAMUSCULAR | Status: AC
  Administered 2014-02-23: 1000 ug via INTRAMUSCULAR

## 2014-03-30 ENCOUNTER — Ambulatory Visit: Payer: BC Managed Care – PPO

## 2016-05-24 DIAGNOSIS — M25474 Effusion, right foot: Secondary | ICD-10-CM | POA: Diagnosis not present

## 2016-08-09 ENCOUNTER — Other Ambulatory Visit: Payer: Self-pay | Admitting: Family Medicine

## 2016-08-09 ENCOUNTER — Other Ambulatory Visit (INDEPENDENT_AMBULATORY_CARE_PROVIDER_SITE_OTHER): Payer: BLUE CROSS/BLUE SHIELD

## 2016-08-09 DIAGNOSIS — E785 Hyperlipidemia, unspecified: Secondary | ICD-10-CM

## 2016-08-09 DIAGNOSIS — Z125 Encounter for screening for malignant neoplasm of prostate: Secondary | ICD-10-CM | POA: Diagnosis not present

## 2016-08-09 DIAGNOSIS — E538 Deficiency of other specified B group vitamins: Secondary | ICD-10-CM

## 2016-08-09 LAB — BASIC METABOLIC PANEL
BUN: 22 mg/dL (ref 6–23)
CO2: 29 mEq/L (ref 19–32)
CREATININE: 0.96 mg/dL (ref 0.40–1.50)
Calcium: 9.2 mg/dL (ref 8.4–10.5)
Chloride: 108 mEq/L (ref 96–112)
GFR: 85.27 mL/min (ref >60.00)
Glucose, Bld: 89 mg/dL (ref 70–99)
Potassium: 4.3 mEq/L (ref 3.5–5.1)
Sodium: 141 mEq/L (ref 135–145)

## 2016-08-09 LAB — LIPID PANEL
CHOL/HDL RATIO: 4
Cholesterol: 149 mg/dL (ref 0–200)
HDL: 35.8 mg/dL — AB (ref >39.00)
LDL CALC: 99 mg/dL (ref 0–99)
NonHDL: 113.44
TRIGLYCERIDES: 73 mg/dL (ref 0.0–149.0)
VLDL: 14.6 mg/dL (ref 0.0–40.0)

## 2016-08-12 ENCOUNTER — Ambulatory Visit (INDEPENDENT_AMBULATORY_CARE_PROVIDER_SITE_OTHER): Payer: BLUE CROSS/BLUE SHIELD | Admitting: Family Medicine

## 2016-08-12 ENCOUNTER — Encounter: Payer: Self-pay | Admitting: Family Medicine

## 2016-08-12 VITALS — BP 114/90 | HR 68 | Temp 98.4°F | Ht 76.0 in | Wt 341.8 lb

## 2016-08-12 DIAGNOSIS — G479 Sleep disorder, unspecified: Secondary | ICD-10-CM | POA: Diagnosis not present

## 2016-08-12 DIAGNOSIS — E785 Hyperlipidemia, unspecified: Secondary | ICD-10-CM

## 2016-08-12 DIAGNOSIS — R600 Localized edema: Secondary | ICD-10-CM

## 2016-08-12 DIAGNOSIS — Z1159 Encounter for screening for other viral diseases: Secondary | ICD-10-CM

## 2016-08-12 DIAGNOSIS — Z0000 Encounter for general adult medical examination without abnormal findings: Secondary | ICD-10-CM | POA: Diagnosis not present

## 2016-08-12 DIAGNOSIS — R5383 Other fatigue: Secondary | ICD-10-CM | POA: Diagnosis not present

## 2016-08-12 DIAGNOSIS — N401 Enlarged prostate with lower urinary tract symptoms: Secondary | ICD-10-CM

## 2016-08-12 DIAGNOSIS — E538 Deficiency of other specified B group vitamins: Secondary | ICD-10-CM | POA: Diagnosis not present

## 2016-08-12 DIAGNOSIS — R351 Nocturia: Secondary | ICD-10-CM

## 2016-08-12 LAB — PSA: PSA: 1.11 ng/mL (ref 0.10–4.00)

## 2016-08-12 LAB — VITAMIN B12: Vitamin B-12: 259 pg/mL (ref 211–911)

## 2016-08-12 NOTE — Progress Notes (Signed)
BP 114/90   Pulse 68   Temp 98.4 F (36.9 C) (Oral)   Ht 6\' 4"  (1.93 m)   Wt (!) 341 lb 12 oz (155 kg)   BMI 41.60 kg/m    CC: CPE Subjective:    Patient ID: Steve Lynch, male    DOB: 01/03/1956, 59 y.o.   MRN: [id number]  HPI: Steve Lynch is a 60 y.o. male presenting on 08/12/2016 for Annual Exam   Increased weight and fatigue noted over last year. Over last 6-8 months noticing some depressed mood and increased emotionality. Longstanding decreased libido.   Endorses non-restorative sleeping. Wife endorses "hiccuping" in middle of the night that wakes her up but not him. No witnessed apneic events. Snoring when supine. + daytime somnolence.   H/o b12 deficiency - s/p 5 months b12 IM shots, didn't notice any difference.   Noted RLE swelling. H/o sclerotherapy RLE.   Preventative: Colon cancer screening - latest 2013, rec rpt 10 years [facility]) Prostate cancer screening - increased nocturia on weekends Flu shot - declines Tetanus - declines Seat belt use discussed Sunscreen use discussed, no changing moles.  Non smoker Alcohol - rare  Lives with wife, 1 dog Occupation: [employer]: 2 yrs college Activity: no regular exercise besides work Diet: some water, fruits/vegetables daily Involved in [employer] on [employer] (based out of [place]  Relevant past medical, surgical, family and social history reviewed and updated as indicated. Interim medical history since our last visit reviewed. Allergies and medications reviewed and updated. No current outpatient prescriptions on file prior to visit.   No current facility-administered medications on file prior to visit.     Review of Systems  Constitutional: Positive for fatigue. Negative for activity change, appetite change, chills, fever and unexpected weight change.  HENT: Negative for hearing loss.   Eyes: Negative for visual disturbance.  Respiratory: Negative for cough, chest tightness,  shortness of breath and wheezing.   Cardiovascular: Positive for leg swelling (R). Negative for chest pain and palpitations.  Gastrointestinal: Negative for abdominal distention, abdominal pain, blood in stool, constipation, diarrhea, nausea and vomiting.  Genitourinary: Negative for difficulty urinating and hematuria.  Musculoskeletal: Negative for arthralgias, myalgias and neck pain.  Skin: Negative for rash.  Neurological: Negative for dizziness, seizures, syncope and headaches.  Hematological: Negative for adenopathy. Does not bruise/bleed easily.  Psychiatric/Behavioral: Positive for dysphoric mood (more emotional). The patient is not nervous/anxious.    Per HPI unless specifically indicated in ROS section     Objective:    BP 114/90   Pulse 68   Temp 98.4 F (36.9 C) (Oral)   Ht 6\' 4"  (1.93 m)   Wt (!) 341 lb 12 oz (155 kg)   BMI 41.60 kg/m   Wt Readings from Last 3 Encounters:  08/12/16 (!) 341 lb 12 oz (155 kg)  10/11/13 (!) 316 lb (143.3 kg)  08/16/13 (!) 314 lb 4 oz (142.5 kg)    Physical Exam  Constitutional: He is oriented to person, place, and time. He appears well-developed and well-nourished. No distress.  HENT:  Head: Normocephalic and atraumatic.  Right Ear: Hearing, tympanic membrane, external ear and ear canal normal.  Left Ear: Hearing, tympanic membrane, external ear and ear canal normal.  Nose: Nose normal.  Mouth/Throat: Uvula is midline, oropharynx is clear and moist and mucous membranes are normal. No oropharyngeal exudate, posterior oropharyngeal edema or posterior oropharyngeal erythema.  Eyes: Conjunctivae and EOM are normal. Pupils are equal,  round, and reactive to light. No scleral icterus.  Neck: Normal range of motion. Neck supple. No thyromegaly present.  Cardiovascular: Normal rate, regular rhythm, normal heart sounds and intact distal pulses.   No murmur heard. Pulses:      Radial pulses are 2+ on the right side, and 2+ on the left side.    Pulmonary/Chest: Effort normal and breath sounds normal. No respiratory distress. He has no wheezes. He has no rales.  Abdominal: Soft. Bowel sounds are normal. He exhibits no distension and no mass. There is no tenderness. There is no rebound and no guarding.  Genitourinary: Rectum normal. Rectal exam shows no external hemorrhoid, no internal hemorrhoid, no fissure, no mass, no tenderness and anal tone normal. Prostate is enlarged (30gm). Prostate is not tender.  Musculoskeletal: Normal range of motion. He exhibits edema (R non pitting edema).  Lymphadenopathy:    He has no cervical adenopathy.  Neurological: He is alert and oriented to person, place, and time.  CN grossly intact, station and gait intact  Skin: Skin is warm and dry. No rash noted.  Psychiatric: He has a normal mood and affect. His behavior is normal. Judgment and thought content normal.  Nursing note and vitals reviewed.  Results for orders placed or performed in visit on 08/09/16  Vitamin B12  Result Value Ref Range   Vitamin B-12 259 211 - 911 pg/mL  Lipid panel  Result Value Ref Range   Cholesterol 149 0 - 200 mg/dL   Triglycerides 16.173.0 0.0 - 149.0 mg/dL   HDL 09.6035.80 (L) >45.40>39.00 mg/dL   VLDL 98.114.6 0.0 - 19.140.0 mg/dL   LDL Cholesterol 99 0 - 99 mg/dL   Total CHOL/HDL Ratio 4    NonHDL 113.44   Basic metabolic panel  Result Value Ref Range   Sodium 141 135 - 145 mEq/L   Potassium 4.3 3.5 - 5.1 mEq/L   Chloride 108 96 - 112 mEq/L   CO2 29 19 - 32 mEq/L   Glucose, Bld 89 70 - 99 mg/dL   BUN 22 6 - 23 mg/dL   Creatinine, Ser 4.780.96 0.40 - 1.50 mg/dL   Calcium 9.2 8.4 - 29.510.5 mg/dL   GFR 62.1385.27 >08.65>60.00 mL/min  PSA  Result Value Ref Range   PSA 1.11 0.10 - 4.00 ng/mL      Assessment & Plan:   Problem List Items Addressed This Visit    BPH associated with nocturia    PSA stable. Continue to monitor. Consider flomax.       Dyslipidemia    Chol levels actually stable, except for low HDL. Encouraged increased  aerobic exercise.       Fatigue    Ongoing. Check for reversible causes of fatigue.  With history of depressed mood - also check testosterone levels.      Relevant Orders   Testosterone   CBC with Differential/Platelet   TSH   Healthcare maintenance - Primary    Preventative protocols reviewed and updated unless pt declined. Discussed healthy diet and lifestyle.       Pedal edema    Describes predominantly right dependent edema - at same leg as he's had sclerotherapy in the past for varicose veins. Anticipate component of CVI. Significant prolonged travel for work - will check D Dimer.       Relevant Orders   D-dimer, quantitative (not at [facility])   Severe obesity (BMI >= 40) (HCC)    Marked weight gain over last 3 yrs. Discussed healthy diet and  lifestyle changes to affect sustainable weight loss. Discussed importance of decreased sweetened beverages.      Relevant Orders   Hepatic function panel   Sleep disorder    Describes sleep apnea symptoms - have recommended sleep evaluation referral.      Relevant Orders   Ambulatory referral to Pulmonology   Vitamin B12 deficiency    rec restart oral b12. Did not improve with IM B12 3 yrs ago - difficult due to schedule to come in for labs.        Other Visit Diagnoses    Need for hepatitis C screening test       Relevant Orders   Hepatitis C antibody       Follow up plan: No Follow-up on file.  Eustaquio BoydenJavier Apolinar Bero, MD

## 2016-08-12 NOTE — Patient Instructions (Addendum)
We will refer you to sleep doctor.  Sign release for records from colonoscopy Try to back off sweetened beverages, more water (even flavored water).  Work on [employer]. Try to implement walking into routine.  Return at your convenience for am labs. We will be in touch with results.  Start oral b12 vitamin over the counter daily.  Health Maintenance, Male A healthy lifestyle and preventative care can promote health and wellness.  Maintain regular health, dental, and eye exams.  Eat a healthy diet. Foods like vegetables, fruits, whole grains, low-fat dairy products, and lean protein foods contain the nutrients you need and are low in calories. Decrease your intake of foods high in solid fats, added sugars, and salt. Get information about a proper diet from your health care provider, if necessary.  Regular physical exercise is one of the most important things you can do for your health. Most adults should get at least 150 minutes of moderate-intensity exercise (any activity that increases your heart rate and causes you to sweat) each week. In addition, most adults need muscle-strengthening exercises on 2 or more days a week.   Maintain a healthy weight. The body mass index (BMI) is a screening tool to identify possible weight problems. It provides an estimate of body fat based on height and weight. Your health care provider can find your BMI and can help you achieve or maintain a healthy weight. For males 20 years and older:  A BMI below 18.5 is considered underweight.  A BMI of 18.5 to 24.9 is normal.  A BMI of 25 to 29.9 is considered overweight.  A BMI of 30 and above is considered obese.  Maintain normal blood lipids and cholesterol by exercising and minimizing your intake of saturated fat. Eat a balanced diet with plenty of fruits and vegetables. Blood tests for lipids and cholesterol should begin at age 820 and be repeated every 5 years. If your lipid or cholesterol levels are high, you are  over age 58, or you are at high risk for heart disease, you may need your cholesterol levels checked more frequently.Ongoing high lipid and cholesterol levels should be treated with medicines if diet and exercise are not working.  If you smoke, find out from your health care provider how to quit. If you do not use tobacco, do not start.  Lung cancer screening is recommended for adults aged 55-80 years who are at high risk for developing lung cancer because of a history of smoking. A yearly low-dose CT scan of the lungs is recommended for people who have at least a 30-pack-year history of smoking and are current smokers or have quit within the past 15 years. A pack year of smoking is smoking an average of 1 pack of cigarettes a day for 1 year (for example, a 30-pack-year history of smoking could mean smoking 1 pack a day for 30 years or 2 packs a day for 15 years). Yearly screening should continue until the smoker has stopped smoking for at least 15 years. Yearly screening should be stopped for people who develop a health problem that would prevent them from having lung cancer treatment.  If you choose to drink alcohol, do not have more than 2 drinks per day. One drink is considered to be 12 oz (360 mL) of beer, 5 oz (150 mL) of wine, or 1.5 oz (45 mL) of liquor.  Avoid the use of street drugs. Do not share needles with anyone. Ask for help if you need support  or instructions about stopping the use of drugs.  High blood pressure causes heart disease and increases the risk of stroke. High blood pressure is more likely to develop in:  People who have blood pressure in the end of the normal range (100-139/85-89 mm Hg).  People who are overweight or obese.  People who are African American.  If you are 5818-58 years of age, have your blood pressure checked every 3-5 years. If you are 58 years of age or older, have your blood pressure checked every year. You should have your blood pressure measured  twice--once when you are at a hospital or clinic, and once when you are not at a hospital or clinic. Record the average of the two measurements. To check your blood pressure when you are not at a hospital or clinic, you can use:  An automated blood pressure machine at a pharmacy.  A home blood pressure monitor.  If you are 6345-58 years old, ask your health care provider if you should take aspirin to prevent heart disease.  Diabetes screening involves taking a blood sample to check your fasting blood sugar level. This should be done once every 3 years after age 58 if you are at a normal weight and without risk factors for diabetes. Testing should be considered at a younger age or be carried out more frequently if you are overweight and have at least 1 risk factor for diabetes.  Colorectal cancer can be detected and often prevented. Most routine colorectal cancer screening begins at the age of 58 and continues through age 58. However, your health care provider may recommend screening at an earlier age if you have risk factors for colon cancer. On a yearly basis, your health care provider may provide home test kits to check for hidden blood in the stool. A small camera at the end of a tube may be used to directly examine the colon (sigmoidoscopy or colonoscopy) to detect the earliest forms of colorectal cancer. Talk to your health care provider about this at age 950 when routine screening begins. A direct exam of the colon should be repeated every 5-10 years through age 58, unless early forms of precancerous polyps or small growths are found.  People who are at an increased risk for hepatitis B should be screened for this virus. You are considered at high risk for hepatitis B if:  You were born in a country where hepatitis B occurs often. Talk with your health care provider about which countries are considered high risk.  Your parents were born in a high-risk country and you have not received a shot to  protect against hepatitis B (hepatitis B vaccine).  You have HIV or AIDS.  You use needles to inject street drugs.  You live with, or have sex with, someone who has hepatitis B.  You are a man who has sex with other men (MSM).  You get hemodialysis treatment.  You take certain medicines for conditions like cancer, organ transplantation, and autoimmune conditions.  Hepatitis C blood testing is recommended for all people born from [id number] through 1965 and any individual with known risk factors for hepatitis C.  Healthy men should no longer receive prostate-specific antigen (PSA) blood tests as part of routine cancer screening. Talk to your health care provider about prostate cancer screening.  Testicular cancer screening is not recommended for adolescents or adult males who have no symptoms. Screening includes self-exam, a health care provider exam, and other screening tests. Consult with your health  care provider about any symptoms you have or any concerns you have about testicular cancer.  Practice safe sex. Use condoms and avoid high-risk sexual practices to reduce the spread of sexually transmitted infections (STIs).  You should be screened for STIs, including gonorrhea and chlamydia if:  You are sexually active and are younger than 24 years.  You are older than 24 years, and your health care provider tells you that you are at risk for this type of infection.  Your sexual activity has changed since you were last screened, and you are at an increased risk for chlamydia or gonorrhea. Ask your health care provider if you are at risk.  If you are at risk of being infected with HIV, it is recommended that you take a prescription medicine daily to prevent HIV infection. This is called pre-exposure prophylaxis (PrEP). You are considered at risk if:  You are a man who has sex with other men (MSM).  You are a heterosexual man who is sexually active with multiple partners.  You take drugs by  injection.  You are sexually active with a partner who has HIV.  Talk with your health care provider about whether you are at high risk of being infected with HIV. If you choose to begin PrEP, you should first be tested for HIV. You should then be tested every 3 months for as long as you are taking PrEP.  Use sunscreen. Apply sunscreen liberally and repeatedly throughout the day. You should seek shade when your shadow is shorter than you. Protect yourself by wearing long sleeves, pants, a wide-brimmed hat, and sunglasses year round whenever you are outdoors.  Tell your health care provider of new moles or changes in moles, especially if there is a change in shape or color. Also, tell your health care provider if a mole is larger than the size of a pencil eraser.  A one-time screening for abdominal aortic aneurysm (AAA) and surgical repair of large AAAs by ultrasound is recommended for men aged 58-75 years who are current or former smokers.  Stay current with your vaccines (immunizations).   This information is not intended to replace advice given to you by your health care provider. Make sure you discuss any questions you have with your health care provider.   Document Released: 03/28/2008 Document Revised: 10/21/2014 Document Reviewed: 02/25/2011 Elsevier Interactive Patient Education [employer].

## 2016-08-12 NOTE — Progress Notes (Signed)
Pre visit review using our clinic review tool, if applicable. No additional management support is needed unless otherwise documented below in the visit note. 

## 2016-08-13 ENCOUNTER — Encounter: Payer: Self-pay | Admitting: Family Medicine

## 2016-08-13 ENCOUNTER — Other Ambulatory Visit (INDEPENDENT_AMBULATORY_CARE_PROVIDER_SITE_OTHER): Payer: BLUE CROSS/BLUE SHIELD

## 2016-08-13 DIAGNOSIS — Z1159 Encounter for screening for other viral diseases: Secondary | ICD-10-CM

## 2016-08-13 DIAGNOSIS — R600 Localized edema: Secondary | ICD-10-CM | POA: Diagnosis not present

## 2016-08-13 DIAGNOSIS — G479 Sleep disorder, unspecified: Secondary | ICD-10-CM | POA: Insufficient documentation

## 2016-08-13 DIAGNOSIS — R351 Nocturia: Secondary | ICD-10-CM

## 2016-08-13 DIAGNOSIS — Z9989 Dependence on other enabling machines and devices: Secondary | ICD-10-CM | POA: Insufficient documentation

## 2016-08-13 DIAGNOSIS — R5383 Other fatigue: Secondary | ICD-10-CM

## 2016-08-13 DIAGNOSIS — G4733 Obstructive sleep apnea (adult) (pediatric): Secondary | ICD-10-CM | POA: Insufficient documentation

## 2016-08-13 DIAGNOSIS — N401 Enlarged prostate with lower urinary tract symptoms: Secondary | ICD-10-CM | POA: Insufficient documentation

## 2016-08-13 LAB — HEPATIC FUNCTION PANEL
ALT: 18 U/L (ref 0–53)
AST: 16 U/L (ref 0–37)
Albumin: 4 g/dL (ref 3.5–5.2)
Alkaline Phosphatase: 92 U/L (ref 39–117)
Bilirubin, Direct: 0 mg/dL (ref 0.0–0.3)
TOTAL PROTEIN: 7.8 g/dL (ref 6.0–8.3)
Total Bilirubin: 0.5 mg/dL (ref 0.2–1.2)

## 2016-08-13 LAB — CBC WITH DIFFERENTIAL/PLATELET
BASOS PCT: 0.4 % (ref 0.0–3.0)
Basophils Absolute: 0 10*3/uL (ref 0.0–0.1)
EOS PCT: 2.6 % (ref 0.0–5.0)
Eosinophils Absolute: 0.2 10*3/uL (ref 0.0–0.7)
HEMATOCRIT: 42.2 % (ref 39.0–52.0)
HEMOGLOBIN: 14.4 g/dL (ref 13.0–17.0)
LYMPHS PCT: 17.1 % (ref 12.0–46.0)
Lymphs Abs: 1.3 10*3/uL (ref 0.7–4.0)
MCHC: 34.2 g/dL (ref 30.0–36.0)
MCV: 89.4 fl (ref 78.0–100.0)
Monocytes Absolute: 0.5 10*3/uL (ref 0.1–1.0)
Monocytes Relative: 6.3 % (ref 3.0–12.0)
Neutro Abs: 5.5 10*3/uL (ref 1.4–7.7)
Neutrophils Relative %: 73.6 % (ref 43.0–77.0)
Platelets: 306 10*3/uL (ref 150.0–400.0)
RBC: 4.71 Mil/uL (ref 4.22–5.81)
RDW: 13 % (ref 11.5–15.5)
WBC: 7.5 10*3/uL (ref 4.0–10.5)

## 2016-08-13 LAB — TESTOSTERONE: Testosterone: 293.29 ng/dL — ABNORMAL LOW (ref 300.00–890.00)

## 2016-08-13 LAB — TSH: TSH: 2.01 u[IU]/mL (ref 0.35–4.50)

## 2016-08-13 NOTE — Assessment & Plan Note (Signed)
Describes sleep apnea symptoms - have recommended sleep evaluation referral.

## 2016-08-13 NOTE — Assessment & Plan Note (Signed)
PSA stable. Continue to monitor. Consider flomax.

## 2016-08-13 NOTE — Assessment & Plan Note (Addendum)
Ongoing. Check for reversible causes of fatigue.  With history of depressed mood - also check testosterone levels.

## 2016-08-13 NOTE — Assessment & Plan Note (Addendum)
Marked weight gain over last 3 yrs. Discussed healthy diet and lifestyle changes to affect sustainable weight loss. Discussed importance of decreased sweetened beverages.

## 2016-08-13 NOTE — Assessment & Plan Note (Signed)
Chol levels actually stable, except for low HDL. Encouraged increased aerobic exercise.

## 2016-08-13 NOTE — Assessment & Plan Note (Signed)
rec restart oral b12. Did not improve with IM B12 3 yrs ago - difficult due to schedule to come in for labs.

## 2016-08-13 NOTE — Assessment & Plan Note (Signed)
Describes predominantly right dependent edema - at same leg as he's had sclerotherapy in the past for varicose veins. Anticipate component of CVI. Significant prolonged travel for work - will check D Dimer.

## 2016-08-13 NOTE — Assessment & Plan Note (Signed)
Preventative protocols reviewed and updated unless pt declined. Discussed healthy diet and lifestyle.  

## 2016-08-14 ENCOUNTER — Other Ambulatory Visit: Payer: Self-pay | Admitting: Family Medicine

## 2016-08-14 DIAGNOSIS — R600 Localized edema: Secondary | ICD-10-CM

## 2016-08-14 DIAGNOSIS — R7989 Other specified abnormal findings of blood chemistry: Secondary | ICD-10-CM

## 2016-08-14 LAB — HEPATITIS C ANTIBODY: HCV AB: NEGATIVE

## 2016-08-14 LAB — D-DIMER, QUANTITATIVE: D-Dimer, Quant: 0.52 mcg/mL FEU — ABNORMAL HIGH (ref <0.50)

## 2016-08-16 ENCOUNTER — Ambulatory Visit (HOSPITAL_COMMUNITY)
Admission: RE | Admit: 2016-08-16 | Discharge: 2016-08-16 | Disposition: A | Discharge status: Home / Self Care | Payer: BLUE CROSS/BLUE SHIELD | Source: Ambulatory Visit | Attending: Cardiology | Admitting: Cardiology

## 2016-08-16 ENCOUNTER — Telehealth: Payer: Self-pay | Admitting: Family Medicine

## 2016-08-16 ENCOUNTER — Other Ambulatory Visit (INDEPENDENT_AMBULATORY_CARE_PROVIDER_SITE_OTHER): Payer: BLUE CROSS/BLUE SHIELD

## 2016-08-16 DIAGNOSIS — R7989 Other specified abnormal findings of blood chemistry: Secondary | ICD-10-CM | POA: Diagnosis not present

## 2016-08-16 DIAGNOSIS — E349 Endocrine disorder, unspecified: Secondary | ICD-10-CM | POA: Diagnosis not present

## 2016-08-16 DIAGNOSIS — E785 Hyperlipidemia, unspecified: Secondary | ICD-10-CM | POA: Insufficient documentation

## 2016-08-16 DIAGNOSIS — R600 Localized edema: Secondary | ICD-10-CM | POA: Insufficient documentation

## 2016-08-16 NOTE — Telephone Encounter (Signed)
Patient advised.

## 2016-08-16 NOTE — Telephone Encounter (Signed)
-----   Message from Smiley HousemanSandra E Biggs, RVT sent at 08/16/2016  3:29 PM EDT ----- Regarding: Today's Right Lower Extremity Venous Duplex Today's right lower extremity venous duplex is negative for DVT.

## 2016-08-16 NOTE — Telephone Encounter (Signed)
See below.  Patient may already know about this.  Neg for DVT.  Good news. I'll defer to PCP for further mgmt. Thanks.  Routed to PCP as FYI.

## 2016-08-17 LAB — FSH/LH
FSH: 14.3 m[IU]/mL — ABNORMAL HIGH (ref 1.6–8.0)
LH: 6.9 m[IU]/mL (ref 1.5–9.3)

## 2016-08-22 ENCOUNTER — Ambulatory Visit (INDEPENDENT_AMBULATORY_CARE_PROVIDER_SITE_OTHER): Payer: BLUE CROSS/BLUE SHIELD | Admitting: Internal Medicine

## 2016-08-22 ENCOUNTER — Encounter: Payer: Self-pay | Admitting: Internal Medicine

## 2016-08-22 VITALS — BP 132/78 | HR 84 | Ht 76.0 in | Wt 341.0 lb

## 2016-08-22 DIAGNOSIS — G4719 Other hypersomnia: Secondary | ICD-10-CM

## 2016-08-22 NOTE — Progress Notes (Signed)
[facility]      Date: 08/22/2016,   MRN# [id number] Lissa Merlinerry R Mcleroy 06/05/1956 Code Status:  Code Status History    Date Active Date Inactive Code Status Order ID Comments User Context   11/12/2011 10:56 AM 11/14/2011  9:31 PM Full Code [id number]  Mollie GermanyLindsay Elliott Benfield, RN Inpatient     Hosp day:@LENGTHOFSTAYDAYS @ Referring MD: @ATDPROV @     PCP:      AdmissionWeight: (!) 341 lb (154.7 kg)                 CurrentWeight: (!) 341 lb (154.7 kg) Lissa Merlinerry R Pardee is a 60 y.o. old male seen in consultation for sleep problems at the request of Dr. Katrine CohoGuiteirez.     CHIEF COMPLAINT:   fatigue   HISTORY OF PRESENT ILLNESS   60 y/o obese white male seen today for fatigue His fatigue has worsened over the last several months and has been going on for at least 2 years   Patient has been having excessive daytime sleepiness Patient has been having extreme fatigue and tiredness, lack of energy Patient has been told that he has  Loud snoring   His neck size is 18 inches He has gained approx 70 pounds in the past 2 years now weighs 341 pounds  He denies SOB,DOE, Chest pain No NVD Patient is non smoker Has occasional lower ext swelling No signs of infection at this time  EPWORTH SLEEP SCORE is 15    PAST MEDICAL HISTORY   Past Medical History:  Diagnosis Date  . Drug abuse in remission 1990s   Cocaine with rehab  . Dyslipidemia    mild  . Peripheral vascular disease (HCC) 2004   varicose veins right leg  . Tendonitis 10/2010   temporal tendonitis with locked jaw following tooth extraction  . Vitamin B12 deficiency 09/2013     SURGICAL HISTORY   Past Surgical History:  Procedure Laterality Date  . BACK SURGERY  2005   "DISC  REPLACEMENT"   L5-6   . HERNIA REPAIR     1/12   left knee  . KNEE ARTHROPLASTY Left 2012  . TONSILLECTOMY    . TOTAL KNEE ARTHROPLASTY  11/12/2011   Procedure: TOTAL KNEE ARTHROPLASTY;  Surgeon: Shelda PalMatthew D Olin, MD;  Laterality: Right  . VARICOSE VEIN SURGERY Right    sclerotherapy     FAMILY HISTORY   Family History  Problem Relation Age of Onset  . Hyperlipidemia Mother   . Hypertension Mother   . Diabetes Mother   . Cancer Neg Hx   . CAD Neg Hx   . Stroke Neg Hx      SOCIAL HISTORY   Social History  Substance Use Topics  . Smoking status: Never Smoker  . Smokeless tobacco: Never Used  . Alcohol use Yes     Comment: Very rare     MEDICATIONS    Home Medication:  Current Outpatient Rx  . Order #: [id number] Class: Historical Med  . Order #: [id number] Class: Historical Med  . Order #: [id number] Class: Historical Med  . Order #: [id number] Class: Historical Med  . Order #: [id number] Class: Historical Med    Current Medication:  Current Outpatient Prescriptions:  .  ibuprofen (ADVIL,MOTRIN) 400 MG tablet, Take 400 mg by mouth daily with breakfast., Disp: , Rfl:  .  Multiple Vitamin (MULTIVITAMIN) tablet, Take 1 tablet by mouth daily., Disp: , Rfl:  .  omeprazole (PRILOSEC OTC) 20 MG tablet, Take 20 mg by mouth  daily., Disp: , Rfl:  .  vitamin B-12 (CYANOCOBALAMIN) 1000 MCG tablet, Take 1,000 mcg by mouth daily., Disp: , Rfl:  .  vitamin C (ASCORBIC ACID) 500 MG tablet, Take 500 mg by mouth daily., Disp: , Rfl:     ALLERGIES   Patient has no known allergies.     REVIEW OF SYSTEMS   Review of Systems  Constitutional: Positive for malaise/fatigue. Negative for chills, diaphoresis, fever and weight loss.  HENT: Negative for congestion and hearing loss.   Eyes: Negative for blurred vision and double vision.  Respiratory: Positive for wheezing. Negative for cough, hemoptysis, sputum production and shortness of breath.   Cardiovascular: Negative for chest pain, palpitations and orthopnea.  Gastrointestinal: Negative for abdominal pain, heartburn, nausea and vomiting.  Genitourinary: Negative for dysuria and urgency.  Musculoskeletal: Negative for myalgias.  Skin: Negative for  rash.  Neurological: Negative for dizziness, weakness and headaches.  Endo/Heme/Allergies: Does not bruise/bleed easily.  Psychiatric/Behavioral: Negative for depression.  All other systems reviewed and are negative.    VS: BP 132/78 (BP Location: Left Arm, Cuff Size: Normal)   Pulse 84   Ht 6\' 4"  (1.93 m)   Wt (!) 341 lb (154.7 kg)   SpO2 100%   BMI 41.51 kg/m      PHYSICAL EXAM  Physical Exam  Constitutional: He is oriented to person, place, and time. He appears well-developed and well-nourished. No distress.  HENT:  Head: Normocephalic and atraumatic.  Mouth/Throat: No oropharyngeal exudate.  Eyes: EOM are normal. Pupils are equal, round, and reactive to light. No scleral icterus.  Neck: Normal range of motion. Neck supple.  Cardiovascular: Normal rate, regular rhythm and normal heart sounds.   No murmur heard. Pulmonary/Chest: No stridor. No respiratory distress. He has no wheezes.  Abdominal: Soft. Bowel sounds are normal.  Musculoskeletal: Normal range of motion. He exhibits no edema.  Neurological: He is alert and oriented to person, place, and time. No cranial nerve deficit.  Skin: Skin is warm. He is not diaphoretic.  Psychiatric: He has a normal mood and affect.          ASSESSMENT/PLAN    60 yo obese white with extreme fatigue with probable underlying Sleep apnea  1.recommend sleep study 2.recommend weight loss 3.recommend stopping sweet tea and pepsi consumption  Follow up after tests completed   I have personally obtained a history, examined the patient, evaluated laboratory and independently reviewed imaging results, formulated the assessment and plan and placed orders.  The Patient requires high complexity decision making for assessment and support, frequent evaluation and titration of therapies, application of advanced monitoring technologies and extensive interpretation of multiple databases.   Patient satisfied with Plan of action and  management. All questions answered  Lucie LeatherKurian David Bonnie Overdorf, M.D.  [facility]  Medical Director [facility] Medical Director [facility]

## 2016-08-22 NOTE — Patient Instructions (Signed)
Sleep Study Needed  Sleep Apnea  Sleep apnea is a sleep disorder characterized by abnormal pauses in breathing while you sleep. When your breathing pauses, the level of oxygen in your blood decreases. This causes you to move out of deep sleep and into light sleep. As a result, your quality of sleep is poor, and the system that carries your blood throughout your body (cardiovascular system) experiences stress. If sleep apnea remains untreated, the following conditions can develop:  High blood pressure (hypertension).  Coronary artery disease.  Inability to achieve or maintain an erection (impotence).  Impairment of your thought process (cognitive dysfunction). There are three types of sleep apnea: 1. Obstructive sleep apnea--Pauses in breathing during sleep because of a blocked airway. 2. Central sleep apnea--Pauses in breathing during sleep because the area of the brain that controls your breathing does not send the correct signals to the muscles that control breathing. 3. Mixed sleep apnea--A combination of both obstructive and central sleep apnea. RISK FACTORS The following risk factors can increase your risk of developing sleep apnea:  Being overweight.  Smoking.  Having narrow passages in your nose and throat.  Being of older age.  Being male.  Alcohol use.  Sedative and tranquilizer use.  Ethnicity. Among individuals younger than 35 years, African Americans are at increased risk of sleep apnea. SYMPTOMS   Difficulty staying asleep.  Daytime sleepiness and fatigue.  Loss of energy.  Irritability.  Loud, heavy snoring.  Morning headaches.  Trouble concentrating.  Forgetfulness.  Decreased interest in sex.  Unexplained sleepiness. DIAGNOSIS  In order to diagnose sleep apnea, your caregiver will perform a physical examination. A sleep study done in the comfort of your own home may be appropriate if you are otherwise healthy. Your caregiver may also recommend  that you spend the night in a sleep lab. In the sleep lab, several monitors record information about your heart, lungs, and brain while you sleep. Your leg and arm movements and blood oxygen level are also recorded. TREATMENT The following actions may help to resolve mild sleep apnea:  Sleeping on your side.   Using a decongestant if you have nasal congestion.   Avoiding the use of depressants, including alcohol, sedatives, and narcotics.   Losing weight and modifying your diet if you are overweight. There also are devices and treatments to help open your airway:  Oral appliances. These are custom-made mouthpieces that shift your lower jaw forward and slightly open your bite. This opens your airway.  Devices that create positive airway pressure. This positive pressure "splints" your airway open to help you breathe better during sleep. The following devices create positive airway pressure:  Continuous positive airway pressure (CPAP) device. The CPAP device creates a continuous level of air pressure with an air pump. The air is delivered to your airway through a mask while you sleep. This continuous pressure keeps your airway open.  Nasal expiratory positive airway pressure (EPAP) device. The EPAP device creates positive air pressure as you exhale. The device consists of single-use valves, which are inserted into each nostril and held in place by adhesive. The valves create very little resistance when you inhale but create much more resistance when you exhale. That increased resistance creates the positive airway pressure. This positive pressure while you exhale keeps your airway open, making it easier to breath when you inhale again.  Bilevel positive airway pressure (BPAP) device. The BPAP device is used mainly in patients with central sleep apnea. This device is similar to  the CPAP device because it also uses an air pump to deliver continuous air pressure through a mask. However, with the  BPAP machine, the pressure is set at two different levels. The pressure when you exhale is lower than the pressure when you inhale.  Surgery. Typically, surgery is only done if you cannot comply with less invasive treatments or if the less invasive treatments do not improve your condition. Surgery involves removing excess tissue in your airway to create a wider passage way.   This information is not intended to replace advice given to you by your health care provider. Make sure you discuss any questions you have with your health care provider.   Document Released: 09/20/2002 Document Revised: 10/21/2014 Document Reviewed: 02/06/2012 Elsevier Interactive Patient Education [employer] Else[employer] Inc.

## 2016-08-23 ENCOUNTER — Telehealth: Payer: Self-pay | Admitting: Family Medicine

## 2016-08-23 NOTE — Telephone Encounter (Signed)
Pt called. Would like Jeschke back today with lab results from last week.  Please advise

## 2016-08-23 NOTE — Telephone Encounter (Signed)
Patient notified and will await results.

## 2016-08-23 NOTE — Telephone Encounter (Signed)
plz notify we're still waiting on confirmatory testosterone level results - Terri's calling [facility] to see what's taking so long.

## 2016-08-24 LAB — TESTOS,TOTAL,FREE AND SHBG (FEMALE)
SEX HORMONE BINDING GLOB.: 36 nmol/L (ref 22–77)
Testosterone, Free: 52.8 pg/mL (ref 35.0–155.0)
Testosterone,Total,LC/MS/MS: 359 ng/dL (ref 250–1100)

## 2016-08-25 ENCOUNTER — Encounter: Payer: Self-pay | Admitting: Family Medicine

## 2016-09-23 ENCOUNTER — Telehealth: Payer: Self-pay | Admitting: Internal Medicine

## 2016-09-23 DIAGNOSIS — R5383 Other fatigue: Secondary | ICD-10-CM

## 2016-09-23 NOTE — Telephone Encounter (Signed)
Pt's insurance denied in lab study.  Please advise if HST can be ordered for this patient.  If so, please cancel in lab order and place order for HST. Rhonda J Cobb

## 2016-09-23 NOTE — Telephone Encounter (Signed)
Per office protocol HST has been ordered in placed of in lab sleep study per insurance.  Nothing further needed.

## 2016-10-03 ENCOUNTER — Telehealth: Payer: Self-pay | Admitting: Internal Medicine

## 2016-10-03 NOTE — Telephone Encounter (Signed)
Pt stated that he was upset that [employer] denied in lab study. Upset with the insurance company.  Advised patient that most patients prefer the HST, however, pt stated that he had already lost about 30 lbs and he felt like he would continue to loose more.  Pt stated that he wanted to put the HST on hold for now. See how he does during the next couple of months and if he continues to loose more weight then this should take care of his problem. He reports that just with the weight he has lost, he can already tell an improvement in his sleep.  Phone note taken as FYI. Rhonda J Cobb

## 2017-01-22 ENCOUNTER — Telehealth: Payer: Self-pay | Admitting: *Deleted

## 2017-01-22 NOTE — Telephone Encounter (Signed)
Pt left vm on triage and states he was seen for CPE in 07/2016, and it was suggested that he have a sleep study completed. Pt is now agreeable to referral. FYI pt has also seen pulmonology for excessive sleepiness. pls advise

## 2017-01-22 NOTE — Telephone Encounter (Signed)
Noted. Will cc Dr Belia Heman who saw patient last year. Looks like plan was to order home sleep study (pt was previously working on weight loss to control sxs).

## 2017-01-28 NOTE — Telephone Encounter (Signed)
plz notify we have contacted Dr Clovis Fredrickson office to reschedule HST. If he doesn't hear from them in the next week to [place] their office to f/u. Will cc CMA from pulm office who ordered HST last year (09/23/2016 phone note).

## 2017-01-28 NOTE — Telephone Encounter (Signed)
Patient notified and verbalized understanding. 

## 2017-01-28 NOTE — Telephone Encounter (Signed)
I will route this message to the nurse who will be ordering this test, as I am no longer at that office.Thanks.   MA please advise. Thanks.

## 2017-01-28 NOTE — Telephone Encounter (Signed)
LMOM for pt to Steve Lynch back to schedule a f/u appt to be seen so that we can get his HST scheduled. Will await Akkerman back.

## 2017-01-28 NOTE — Telephone Encounter (Signed)
Pt came in requesting update. Can Carroll [phone].

## 2017-01-29 NOTE — Telephone Encounter (Signed)
Spoke with pt and we have scheduled an OV so that pt can get HST scheduled. Nothing further needed t this time.

## 2017-02-27 ENCOUNTER — Ambulatory Visit (INDEPENDENT_AMBULATORY_CARE_PROVIDER_SITE_OTHER): Payer: BLUE CROSS/BLUE SHIELD | Admitting: Internal Medicine

## 2017-02-27 ENCOUNTER — Encounter: Payer: Self-pay | Admitting: Internal Medicine

## 2017-02-27 VITALS — BP 108/86 | HR 103 | Ht 76.0 in | Wt 307.0 lb

## 2017-02-27 DIAGNOSIS — G4733 Obstructive sleep apnea (adult) (pediatric): Secondary | ICD-10-CM | POA: Diagnosis not present

## 2017-02-27 NOTE — Progress Notes (Signed)
[facility]      Date: 02/27/2017,   MRN# [id number] Steve Merlinerry R Bowell 09/01/1956 Code Status:  Code Status History    Date Active Date Inactive Code Status Order ID Comments User Context   11/12/2011 10:56 AM 11/14/2011  9:31 PM Full Code [id number]  Mollie GermanyLindsay Elliott Benfield, RN Inpatient     Hosp day:@LENGTHOFSTAYDAYS @ Referring MD: @ATDPROV @     PCP:      Admission                  Current  Steve Lynch is a 61 y.o. old male seen in consultation for sleep problems at the request of Dr. Katrine CohoGuiteirez.     CHIEF COMPLAINT:   fatigue   HISTORY OF PRESENT ILLNESS   Still has fatigue Did NOT undergo sleep study  His neck size is 18 inches He has lost weight was 345 now 307.   He denies SOB,DOE, Chest pain No NVD Patient is non smoker Has occasional lower ext swelling No signs of infection at this time  EPWORTH SLEEP SCORE is 15     Current Medication:  Current Outpatient Prescriptions:  .  ibuprofen (ADVIL,MOTRIN) 400 MG tablet, Take 400 mg by mouth daily with breakfast., Disp: , Rfl:  .  Magnesium 400 MG CAPS, Take 1 capsule by mouth daily., Disp: , Rfl:  .  Multiple Vitamin (MULTIVITAMIN) tablet, Take 1 tablet by mouth daily., Disp: , Rfl:  .  vitamin B-12 (CYANOCOBALAMIN) 1000 MCG tablet, Take 1,000 mcg by mouth daily., Disp: , Rfl:  .  vitamin C (ASCORBIC ACID) 500 MG tablet, Take 500 mg by mouth daily., Disp: , Rfl:     ALLERGIES   Patient has no known allergies.     REVIEW OF SYSTEMS   Review of Systems  Constitutional: Positive for malaise/fatigue. Negative for chills, diaphoresis, fever and weight loss.  HENT: Negative for congestion and hearing loss.   Eyes: Negative for blurred vision and double vision.  Respiratory: Negative for cough, hemoptysis, sputum production, shortness of breath and wheezing.   Cardiovascular: Negative for chest pain, palpitations and orthopnea.  Skin: Negative for rash.  Neurological:  Negative for weakness.  All other systems reviewed and are negative.    VS: BP 108/86 (BP Location: Right Arm, Cuff Size: Large)   Pulse (!) 103   Ht 6\' 4"  (1.93 m)   SpO2 96%      PHYSICAL EXAM  Physical Exam  Constitutional: He is oriented to person, place, and time. He appears well-developed and well-nourished. No distress.  Cardiovascular: Normal rate, regular rhythm and normal heart sounds.   No murmur heard. Pulmonary/Chest: Effort normal and breath sounds normal. No stridor. No respiratory distress. He has no wheezes.  Abdominal: Soft. Bowel sounds are normal.  Musculoskeletal: Normal range of motion. He exhibits no edema.  Neurological: He is alert and oriented to person, place, and time. No cranial nerve deficit.  Skin: Skin is warm. He is not diaphoretic.  Psychiatric: He has a normal mood and affect.          ASSESSMENT/PLAN    61 yo obese white with extreme fatigue with probable underlying Sleep apnea Will need to get sleep study for further assessment, patient has agreed to get it done  1.recommend sleep study 2.recommend weight loss  Follow up after tests completed  Patient satisfied with Plan of action and management. All questions answered  Lucie LeatherKurian David Bertran Zeimet, M.D.  [facility]  Medical Director [place] Director [facility] Cardio-Pulmonary Department

## 2017-02-27 NOTE — Patient Instructions (Signed)
Recommend home sleep study Continue weight loss

## 2017-03-14 ENCOUNTER — Encounter: Payer: Self-pay | Admitting: Internal Medicine

## 2017-03-14 DIAGNOSIS — R5383 Other fatigue: Secondary | ICD-10-CM

## 2017-03-20 ENCOUNTER — Other Ambulatory Visit: Payer: Self-pay | Admitting: Internal Medicine

## 2017-03-20 DIAGNOSIS — G4733 Obstructive sleep apnea (adult) (pediatric): Secondary | ICD-10-CM

## 2017-03-20 NOTE — Progress Notes (Signed)
Orders placed Patient aware of results OSA and in lab titration study needed.

## 2017-03-24 ENCOUNTER — Telehealth: Payer: Self-pay | Admitting: *Deleted

## 2017-03-24 NOTE — Telephone Encounter (Signed)
-----   Message from Erin FullingKurian Kasa, MD sent at 03/21/2017 11:08 AM EDT ----- Regarding: RE: HST results  in-lab CPAP titration titration with bipap if necessary.      ----- Message ----- From: Alease Framearter, Sonya S, CMA Sent: 03/21/2017   9:41 AM To: Erin FullingKurian Kasa, MD Subject: FW: HST results                                Please advise on specific testing original copy in folder. ----- Message ----- From: Erin FullingKasa, Kurian, MD Sent: 03/21/2017   8:39 AM To: Alease FrameSonya S Carter, CMA Subject: RE: HST results                                Please follow recommended testing  ----- Message ----- From: Alease Framearter, Sonya S, CMA Sent: 03/20/2017   4:51 PM To: Erin FullingKurian Kasa, MD Subject: FW: HST results                                  ----- Message ----- From: Shane Crutchamachandran, Pradeep, MD Sent: 03/20/2017   4:02 PM To: [facility] Subject: HST results                                    HST showed severe OSA with AHI of 63. Also had some central sleep apnea, therefore should be sent for an in-lab CPAP titration titration with bipap if necessary.    Further testing recommendations made in the report, please forward a copy of report to Dr. Belia HemanKasa to see if he would want to order any of those tests.

## 2017-03-24 NOTE — Telephone Encounter (Signed)
Patient aware.[place] 

## 2017-03-31 ENCOUNTER — Telehealth: Payer: Self-pay | Admitting: *Deleted

## 2017-03-31 NOTE — Telephone Encounter (Signed)
-----   Message from Erin FullingKurian Kasa, MD sent at 03/21/2017  8:39 AM EDT ----- Regarding: RE: HST results Please follow recommended testing  ----- Message ----- From: Alease Framearter, Maleek Craver S, CMA Sent: 03/20/2017   4:51 PM To: Erin FullingKurian Kasa, MD Subject: FW: HST results                                  ----- Message ----- From: Shane Crutchamachandran, Pradeep, MD Sent: 03/20/2017   4:02 PM To: [facility] Clinical Pool Subject: HST results                                    HST showed severe OSA with AHI of 63. Also had some central sleep apnea, therefore should be sent for an in-lab CPAP titration titration with bipap if necessary.    Further testing recommendations made in the report, please forward a copy of report to Dr. Belia HemanKasa to see if he would want to order any of those tests.

## 2017-03-31 NOTE — Telephone Encounter (Signed)
In lab sleep study has been denied and will need peer-to peer.

## 2017-04-01 ENCOUNTER — Ambulatory Visit: Payer: BLUE CROSS/BLUE SHIELD | Attending: Internal Medicine

## 2017-04-01 ENCOUNTER — Encounter: Payer: Self-pay | Admitting: Internal Medicine

## 2017-04-01 DIAGNOSIS — G4733 Obstructive sleep apnea (adult) (pediatric): Secondary | ICD-10-CM | POA: Diagnosis not present

## 2017-04-02 ENCOUNTER — Encounter: Payer: Self-pay | Admitting: Family Medicine

## 2017-04-02 ENCOUNTER — Telehealth: Payer: Self-pay | Admitting: Family Medicine

## 2017-04-02 ENCOUNTER — Ambulatory Visit (INDEPENDENT_AMBULATORY_CARE_PROVIDER_SITE_OTHER): Payer: BLUE CROSS/BLUE SHIELD | Admitting: Family Medicine

## 2017-04-02 VITALS — BP 122/82 | HR 70 | Temp 98.0°F | Ht 76.0 in | Wt 316.2 lb

## 2017-04-02 DIAGNOSIS — G4733 Obstructive sleep apnea (adult) (pediatric): Secondary | ICD-10-CM | POA: Diagnosis not present

## 2017-04-02 DIAGNOSIS — F329 Major depressive disorder, single episode, unspecified: Secondary | ICD-10-CM | POA: Insufficient documentation

## 2017-04-02 DIAGNOSIS — Z9989 Dependence on other enabling machines and devices: Secondary | ICD-10-CM

## 2017-04-02 DIAGNOSIS — R5382 Chronic fatigue, unspecified: Secondary | ICD-10-CM | POA: Diagnosis not present

## 2017-04-02 DIAGNOSIS — F39 Unspecified mood [affective] disorder: Secondary | ICD-10-CM

## 2017-04-02 DIAGNOSIS — F331 Major depressive disorder, recurrent, moderate: Secondary | ICD-10-CM | POA: Insufficient documentation

## 2017-04-02 DIAGNOSIS — F325 Major depressive disorder, single episode, in full remission: Secondary | ICD-10-CM | POA: Insufficient documentation

## 2017-04-02 DIAGNOSIS — F321 Major depressive disorder, single episode, moderate: Secondary | ICD-10-CM | POA: Insufficient documentation

## 2017-04-02 MED ORDER — CITALOPRAM HYDROBROMIDE 10 MG PO TABS: 10.0000 mg | ORAL_TABLET | Freq: Every day | ORAL | 3 refills | Status: DC

## 2017-04-02 MED ORDER — LITHIUM CARBONATE ER 300 MG PO TBCR: 300.0000 mg | EXTENDED_RELEASE_TABLET | Freq: Two times a day (BID) | ORAL | 1 refills | Status: DC

## 2017-04-02 MED ORDER — ARIPIPRAZOLE 10 MG PO TABS: 10.0000 mg | ORAL_TABLET | Freq: Every day | ORAL | 2 refills | Status: DC

## 2017-04-02 NOTE — Telephone Encounter (Signed)
Ok let's try abilify - may be more expensive so would have him price out. I've sent this in to his pharmacy.  Thank you

## 2017-04-02 NOTE — Progress Notes (Signed)
BP 122/82   Pulse 70   Temp 98 F (36.7 C)   Ht 6\' 4"  (1.93 m)   Wt (!) 316 lb 4 oz (143.5 kg)   SpO2 99%   BMI 38.50 kg/m    CC: discuss mood Subjective:    Patient ID: Lissa Merlin Giles, male    DOB: 05/25/1956, 61 y.o.   MRN: [id number]  HPI: Jazir Newey Dombkowski is a 61 y.o. male presenting on 04/02/2017 for Acute Visit ([facility] testing done on last night 6/19-- notice difference in last year , very rarely desire alot of things, ? chemical imbalance)   Recent home sleep study showing severe OSA with AHI 63. He had in-lab CPAP titration last night ([facility]). Pending set up with CPAP.   Wife brings up concerns about mood "chemical imbalance".  Work changes - planning on [employer]. Anxious about this.   1+ year depressed mood, fatigue, ++ anhedonia, increased irritability, "more mean," increased anger, incidents of road rage. Concentration ok. Over sleeping. ++ guilt. Appetite up. + SI without plan. Has grandchildren to live for. No HI. Negativity with wife.  Increased anxiety over work stressors.  Increasing spending/spurging over the last 1.5 yrs.  He does endorse some highs but denies significant manic episodes.  No fmhx bipolar.  + fmhx depression.   He does have preacher he talks with.   Denies rec drugs.  Rare alcohol.  Never smoker.   Relevant past medical, surgical, family and social history reviewed and updated as indicated. Interim medical history since our last visit reviewed. Allergies and medications reviewed and updated. Outpatient Medications Prior to Visit  Medication Sig Dispense Refill  . Multiple Vitamin (MULTIVITAMIN) tablet Take 1 tablet by mouth daily.    . vitamin B-12 (CYANOCOBALAMIN) 1000 MCG tablet Take 1,000 mcg by mouth daily.    . vitamin C (ASCORBIC ACID) 500 MG tablet Take 500 mg by mouth daily. Takes only in Winter    . ibuprofen (ADVIL,MOTRIN) 400 MG tablet Take 400 mg by mouth daily with breakfast.    . Magnesium 400 MG CAPS Take 1 capsule by  mouth daily.     No facility-administered medications prior to visit.     Past Medical History:  Diagnosis Date  . Drug abuse in remission 1990s   Cocaine with rehab  . Dyslipidemia    mild  . Peripheral vascular disease (HCC) 2004   varicose veins right leg  . Tendonitis 10/2010   temporal tendonitis with locked jaw following tooth extraction  . Vitamin B12 deficiency 09/2013    Past Surgical History:  Procedure Laterality Date  . BACK SURGERY  2005   "DISC  REPLACEMENT"   L5-6   . COLONOSCOPY  12/2011   TAx1, diverticulosis, rpt 5 yrs (Dr Myra Gianotti @ [facility])  . HERNIA REPAIR     1/12   left knee  . KNEE ARTHROPLASTY Left 2012  . TONSILLECTOMY    . TOTAL KNEE ARTHROPLASTY  11/12/2011   Procedure: TOTAL KNEE ARTHROPLASTY;  Surgeon: Shelda Pal, MD; Laterality: Right  . VARICOSE VEIN SURGERY Right    sclerotherapy    Family History  Problem Relation Age of Onset  . Hyperlipidemia Mother   . Hypertension Mother   . Diabetes Mother   . Cancer Neg Hx   . CAD Neg Hx   . Stroke Neg Hx     Social History  Substance Use Topics  . Smoking status: Never Smoker  . Smokeless tobacco: Never  Used  . Alcohol use Yes     Comment: Very rare    Per HPI unless specifically indicated in ROS section below Review of Systems     Objective:    BP 122/82   Pulse 70   Temp 98 F (36.7 C)   Ht 6\' 4"  (1.93 m)   Wt (!) 316 lb 4 oz (143.5 kg)   SpO2 99%   BMI 38.50 kg/m   Wt Readings from Last 3 Encounters:  04/02/17 (!) 316 lb 4 oz (143.5 kg)  02/27/17 (!) 307 lb (139.3 kg)  08/22/16 (!) 341 lb (154.7 kg)    Physical Exam  Constitutional: He appears well-developed and well-nourished. No distress.  Psychiatric: He has a normal mood and affect. His behavior is normal. Judgment and thought content normal.  Nursing note and vitals reviewed.  Results for orders placed or performed in visit on 08/25/16  HM COLONOSCOPY  Result Value Ref Range   HM Colonoscopy See  Report (in chart) See Report (in chart), Patient Reported      Assessment & Plan:  Over 25 minutes were spent face-to-face with the patient during this encounter and >50% of that time was spent on counseling and coordination of care  Problem List Items Addressed This Visit    Fatigue   Mood disorder (HCC) - Primary    Meets criteria for MDD however also had positive screen for bipolar disorder. I recommend referral to psychiatry for bipolar evaluation, discussed neurotransmitter imbalance and benefits of mood medication. Recommended counseling referral, will await psych eval first. Discussed healthy stress relieving strategies to work on.  If prolonged wait for psychiatry, discussed possibly starting mood stabilizer. Pt would agree.  RTC 4-6 wks f/u visit.  PHQ9 = 18, extremely difficult to function MDQ positive screen.      Relevant Orders   Ambulatory referral to Psychiatry   OSA on CPAP    Appreciate pulm care. Pending CPAP      Severe obesity (BMI >= 40) (HCC)       Follow up plan: Return in about 6 weeks (around 05/14/2017) for follow up visit.  Eustaquio BoydenJavier Dashia Caldeira, MD

## 2017-04-02 NOTE — Telephone Encounter (Signed)
Spoke with Patient & he Declined the Lithium-- he states he is afraid of that . That he is very leary' of it because he hears it is some "wicked stuff". Wants to know if there is anything else milder that he can do while waiting to Dr Maryruth BunKapur?

## 2017-04-02 NOTE — Patient Instructions (Addendum)
We will refer you to psychiatry for further evaluation. See Shirlee LimerickMarion on your way out.  Work on healthy stress relieving strategies.

## 2017-04-02 NOTE — Assessment & Plan Note (Signed)
Appreciate pulm care. Pending CPAP

## 2017-04-02 NOTE — Assessment & Plan Note (Signed)
Meets criteria for MDD however also had positive screen for bipolar disorder. I recommend referral to psychiatry for bipolar evaluation, discussed neurotransmitter imbalance and benefits of mood medication. Recommended counseling referral, will await psych eval first. Discussed healthy stress relieving strategies to work on.  If prolonged wait for psychiatry, discussed possibly starting mood stabilizer. Pt would agree.  RTC 4-6 wks f/u visit.  PHQ9 = 18, extremely difficult to function MDQ positive screen.

## 2017-04-02 NOTE — Telephone Encounter (Signed)
Touched base with Dr Shelda AltesKapur's office - wait time may be up to 8 wks. Given long wait time, I'd like to start lithium extended release 300mg  once daily for 1 wk then increase to 300mg  twice daily. Keep f/u appt in 4-6 wks. Med sent to pharmacy.

## 2017-04-03 NOTE — Telephone Encounter (Signed)
Spoke with patient & informed about  Other medication option.  Sent into RX.

## 2017-04-09 ENCOUNTER — Telehealth: Payer: Self-pay | Admitting: Internal Medicine

## 2017-04-09 DIAGNOSIS — G4733 Obstructive sleep apnea (adult) (pediatric): Secondary | ICD-10-CM | POA: Diagnosis not present

## 2017-04-09 NOTE — Telephone Encounter (Signed)
Pt states he would like his sleep study result. Please Zima.

## 2017-04-09 NOTE — Telephone Encounter (Signed)
Patient aware of titration study results Orders have been placed. Nothing further needed.

## 2017-04-09 NOTE — Telephone Encounter (Signed)
-----   Message from Shane CrutchPradeep Ramachandran, MD sent at 04/09/2017 12:36 PM EDT ----- Regarding: sleep titration results.  Pt requires CPAP with pressures range of 9-14 cmH2O

## 2017-04-14 ENCOUNTER — Telehealth: Payer: Self-pay | Admitting: Internal Medicine

## 2017-04-14 NOTE — Telephone Encounter (Signed)
Pt wants to know where we sent his orders for sleep study, he would like to know that and if we can expedite it.    Please advise.

## 2017-04-14 NOTE — Telephone Encounter (Signed)
Records were faxed to [facility] with instructions on the order to "please set up ASAP" .  When I spoke with patient, to see where he wanted order sent, he expressed the urgency and I advised patient at that time that with [employer] the insurance usually requires a prior authorization on the device.  But that I would put on the order to please set up ASAP, which I did.  [facility] my [place]. However, usual set up takes 7-10 working days. Rhonda J Cobb

## 2017-04-15 DIAGNOSIS — G4733 Obstructive sleep apnea (adult) (pediatric): Secondary | ICD-10-CM | POA: Diagnosis not present

## 2017-04-15 NOTE — Telephone Encounter (Signed)
Called [facility] to check on status of set up.  Per Melissa at [facility], message was left on pt's VM to contact their office to schedule set up appointment. Rhonda J Cobb

## 2017-04-15 NOTE — Telephone Encounter (Signed)
Pt has been scheduled for CPAP Set Up for today per Melissa at [facility].  Melissa spoke with patient and pt is aware of appointment. Rhonda J Cobb

## 2017-04-15 NOTE — Telephone Encounter (Signed)
Nothing else needed at this time. Will close phone message. Rhonda J Cobb

## 2017-05-15 ENCOUNTER — Ambulatory Visit: Payer: BLUE CROSS/BLUE SHIELD | Admitting: Family Medicine

## 2017-05-16 DIAGNOSIS — G4733 Obstructive sleep apnea (adult) (pediatric): Secondary | ICD-10-CM | POA: Diagnosis not present

## 2017-05-21 ENCOUNTER — Ambulatory Visit: Payer: BLUE CROSS/BLUE SHIELD | Admitting: Family Medicine

## 2017-06-16 DIAGNOSIS — G4733 Obstructive sleep apnea (adult) (pediatric): Secondary | ICD-10-CM | POA: Diagnosis not present

## 2017-08-25 NOTE — Progress Notes (Signed)
b12 shot 

## 2017-09-13 HISTORY — PX: IM NAILING TIBIA: SUR734

## 2017-09-15 ENCOUNTER — Ambulatory Visit (INDEPENDENT_AMBULATORY_CARE_PROVIDER_SITE_OTHER): Payer: BLUE CROSS/BLUE SHIELD | Admitting: Family Medicine

## 2017-09-15 ENCOUNTER — Encounter: Payer: Self-pay | Admitting: Family Medicine

## 2017-09-15 ENCOUNTER — Ambulatory Visit (INDEPENDENT_AMBULATORY_CARE_PROVIDER_SITE_OTHER)
Admission: RE | Admit: 2017-09-15 | Discharge: 2017-09-15 | Disposition: A | Discharge status: Home / Self Care | Payer: BLUE CROSS/BLUE SHIELD | Source: Ambulatory Visit | Attending: Family Medicine | Admitting: Family Medicine

## 2017-09-15 VITALS — BP 122/76 | HR 85 | Temp 97.9°F | Wt 326.2 lb

## 2017-09-15 DIAGNOSIS — S6702XA Crushing injury of left thumb, initial encounter: Secondary | ICD-10-CM

## 2017-09-15 DIAGNOSIS — S62667A Nondisplaced fracture of distal phalanx of left little finger, initial encounter for closed fracture: Secondary | ICD-10-CM | POA: Diagnosis not present

## 2017-09-15 HISTORY — DX: Crushing injury of left thumb, initial encounter: S67.02XA

## 2017-09-15 NOTE — Patient Instructions (Addendum)
Xray of R thumb today. We will be in touch with xray results and plan. Trephination performed today. Keep area clean and dry.  Watch for fever, streaking redness.

## 2017-09-15 NOTE — Progress Notes (Signed)
BP 122/76 (BP Location: Left Arm, Patient Position: Sitting, Cuff Size: Large)   Pulse 85   Temp 97.9 F (36.6 C) (Oral)   Wt (!) 326 lb 4 oz (148 kg)   SpO2 96%   BMI 39.71 kg/m    CC: L thumb pain Subjective:    Patient ID: Steve Lynch, male    DOB: 08/27/1956, 61 y.o.   MRN: [id number]  HPI: Steve Lynch is a 61 y.o. male presenting on 09/15/2017 for Hand Pain (left thumb. Generator dropped on thumb after pt fell this weekend. Concerned about infection since he has steel knees. Has taken ibuprofen)   DOI: 09/13/2017 am.  Moving to new house. While rolling a generator on wheels into truck, it slipped and landed on his thumb. Crush injury. Thumb feels numb.  Treating with washing with soapy water and neosporin.  Denies fevers/chills, nausea, streaking redness.   Worried about infection because of bilateral knee replacements (hardware).   Relevant past medical, surgical, family and social history reviewed and updated as indicated. Interim medical history since our last visit reviewed. Allergies and medications reviewed and updated. Outpatient Medications Prior to Visit  Medication Sig Dispense Refill  . Multiple Vitamin (MULTIVITAMIN) tablet Take 1 tablet by mouth daily.    . vitamin B-12 (CYANOCOBALAMIN) 1000 MCG tablet Take 1,000 mcg by mouth daily.    . vitamin C (ASCORBIC ACID) 500 MG tablet Take 500 mg by mouth daily. Takes only in Winter    . ARIPiprazole (ABILIFY) 10 MG tablet Take 1 tablet (10 mg total) by mouth daily. 30 tablet 2  . Magnesium 250 MG TABS Take by mouth daily. During Summer only for leg cramps     No facility-administered medications prior to visit.      Per HPI unless specifically indicated in ROS section below Review of Systems     Objective:    BP 122/76 (BP Location: Left Arm, Patient Position: Sitting, Cuff Size: Large)   Pulse 85   Temp 97.9 F (36.6 C) (Oral)   Wt (!) 326 lb 4 oz (148 kg)   SpO2 96%   BMI 39.71 kg/m   Wt Readings from  Last 3 Encounters:  09/15/17 (!) 326 lb 4 oz (148 kg)  04/02/17 (!) 316 lb 4 oz (143.5 kg)  02/27/17 (!) 307 lb (139.3 kg)    Physical Exam  Constitutional: He appears well-developed and well-nourished. No distress.  Musculoskeletal:  2+ rad pulses bilaterally No pain at scaphoid, CMC or 1st MCP joint L hand. Marked tenderness to palpation dorsal IP joint L thumb along with pain with axial loading of thumb.  Subungal hematoma present Swelling and ecchymosis of distal thumb  Able to flex/extend thumb at IP joint.   Neurological:  Tip of thumb numb  Nursing note and vitals reviewed.  Trephination performed to L thumb successfully with drainage of serosanguinous fluid, pt tolerated well. Thumb numbness improved after procedure.     Assessment & Plan:   Problem List Items Addressed This Visit    Crushing injury of left thumb - Primary    Evidence of fracture of distal thumb phalanx by xray as well as concern for involvement of IP joint. Will await radiology eval, if positive will refer to ortho to follow fracture as well.  Nail bed seems intact, discussed anticipated deformity of nail.  Pt tolerated trephination and thumb pain did improve some after this. After care instructions provided today.       Relevant Orders  DG Hand Complete Left (Completed)       Follow up plan: Return if symptoms worsen or fail to improve.  [name], MD

## 2017-09-15 NOTE — Assessment & Plan Note (Addendum)
Evidence of fracture of distal thumb phalanx by xray as well as concern for involvement of IP joint. Will await radiology eval, if positive will refer to ortho to follow fracture as well.  Nail bed seems intact, discussed anticipated deformity of nail.  Pt tolerated trephination and thumb pain did improve some after this. After care instructions provided today.

## 2017-09-25 DIAGNOSIS — M7751 Other enthesopathy of right foot: Secondary | ICD-10-CM | POA: Diagnosis not present

## 2017-09-25 DIAGNOSIS — S82451A Displaced comminuted fracture of shaft of right fibula, initial encounter for closed fracture: Secondary | ICD-10-CM | POA: Diagnosis not present

## 2017-09-25 DIAGNOSIS — Z01818 Encounter for other preprocedural examination: Secondary | ICD-10-CM | POA: Diagnosis not present

## 2017-09-25 DIAGNOSIS — M2341 Loose body in knee, right knee: Secondary | ICD-10-CM | POA: Diagnosis not present

## 2017-09-25 DIAGNOSIS — S82231A Displaced oblique fracture of shaft of right tibia, initial encounter for closed fracture: Secondary | ICD-10-CM | POA: Diagnosis not present

## 2017-09-25 DIAGNOSIS — S82891A Other fracture of right lower leg, initial encounter for closed fracture: Secondary | ICD-10-CM | POA: Diagnosis not present

## 2017-09-25 DIAGNOSIS — G4733 Obstructive sleep apnea (adult) (pediatric): Secondary | ICD-10-CM | POA: Diagnosis not present

## 2017-09-25 DIAGNOSIS — S82201A Unspecified fracture of shaft of right tibia, initial encounter for closed fracture: Secondary | ICD-10-CM | POA: Diagnosis not present

## 2017-09-25 DIAGNOSIS — Z96651 Presence of right artificial knee joint: Secondary | ICD-10-CM | POA: Diagnosis not present

## 2017-09-25 DIAGNOSIS — S82831A Other fracture of upper and lower end of right fibula, initial encounter for closed fracture: Secondary | ICD-10-CM | POA: Diagnosis not present

## 2017-09-25 DIAGNOSIS — S82301A Unspecified fracture of lower end of right tibia, initial encounter for closed fracture: Secondary | ICD-10-CM | POA: Diagnosis not present

## 2017-09-25 DIAGNOSIS — M9711XA Periprosthetic fracture around internal prosthetic right knee joint, initial encounter: Secondary | ICD-10-CM | POA: Diagnosis not present

## 2017-09-25 DIAGNOSIS — M19071 Primary osteoarthritis, right ankle and foot: Secondary | ICD-10-CM | POA: Diagnosis not present

## 2017-09-25 DIAGNOSIS — Y999 Unspecified external cause status: Secondary | ICD-10-CM | POA: Diagnosis not present

## 2017-09-25 DIAGNOSIS — S82251A Displaced comminuted fracture of shaft of right tibia, initial encounter for closed fracture: Secondary | ICD-10-CM | POA: Diagnosis not present

## 2017-09-26 DIAGNOSIS — S82201A Unspecified fracture of shaft of right tibia, initial encounter for closed fracture: Secondary | ICD-10-CM | POA: Diagnosis not present

## 2017-09-26 DIAGNOSIS — M2341 Loose body in knee, right knee: Secondary | ICD-10-CM | POA: Diagnosis not present

## 2017-09-26 DIAGNOSIS — G4733 Obstructive sleep apnea (adult) (pediatric): Secondary | ICD-10-CM | POA: Diagnosis not present

## 2017-09-26 DIAGNOSIS — S82891A Other fracture of right lower leg, initial encounter for closed fracture: Secondary | ICD-10-CM | POA: Diagnosis not present

## 2017-09-26 DIAGNOSIS — S82251A Displaced comminuted fracture of shaft of right tibia, initial encounter for closed fracture: Secondary | ICD-10-CM | POA: Diagnosis not present

## 2017-09-26 DIAGNOSIS — S8261XA Displaced fracture of lateral malleolus of right fibula, initial encounter for closed fracture: Secondary | ICD-10-CM | POA: Diagnosis not present

## 2017-09-26 DIAGNOSIS — S82291A Other fracture of shaft of right tibia, initial encounter for closed fracture: Secondary | ICD-10-CM | POA: Diagnosis not present

## 2017-09-26 DIAGNOSIS — M25461 Effusion, right knee: Secondary | ICD-10-CM | POA: Diagnosis not present

## 2017-09-26 DIAGNOSIS — M9711XA Periprosthetic fracture around internal prosthetic right knee joint, initial encounter: Secondary | ICD-10-CM | POA: Diagnosis not present

## 2017-09-26 DIAGNOSIS — S82831A Other fracture of upper and lower end of right fibula, initial encounter for closed fracture: Secondary | ICD-10-CM | POA: Diagnosis not present

## 2017-09-26 DIAGNOSIS — Z96651 Presence of right artificial knee joint: Secondary | ICD-10-CM | POA: Diagnosis not present

## 2017-09-26 DIAGNOSIS — M79671 Pain in right foot: Secondary | ICD-10-CM | POA: Diagnosis not present

## 2017-09-27 DIAGNOSIS — M9711XA Periprosthetic fracture around internal prosthetic right knee joint, initial encounter: Secondary | ICD-10-CM | POA: Diagnosis not present

## 2017-09-27 DIAGNOSIS — S82201A Unspecified fracture of shaft of right tibia, initial encounter for closed fracture: Secondary | ICD-10-CM | POA: Diagnosis not present

## 2017-09-27 DIAGNOSIS — G4733 Obstructive sleep apnea (adult) (pediatric): Secondary | ICD-10-CM | POA: Diagnosis not present

## 2017-09-28 DIAGNOSIS — S82201A Unspecified fracture of shaft of right tibia, initial encounter for closed fracture: Secondary | ICD-10-CM | POA: Diagnosis not present

## 2017-09-28 DIAGNOSIS — M9711XA Periprosthetic fracture around internal prosthetic right knee joint, initial encounter: Secondary | ICD-10-CM | POA: Diagnosis not present

## 2017-09-28 DIAGNOSIS — G4733 Obstructive sleep apnea (adult) (pediatric): Secondary | ICD-10-CM | POA: Diagnosis not present

## 2017-09-30 DIAGNOSIS — M84471A Pathological fracture, right ankle, initial encounter for fracture: Secondary | ICD-10-CM | POA: Diagnosis not present

## 2017-10-02 DIAGNOSIS — S82891D Other fracture of right lower leg, subsequent encounter for closed fracture with routine healing: Secondary | ICD-10-CM | POA: Diagnosis not present

## 2017-10-08 DIAGNOSIS — S82891D Other fracture of right lower leg, subsequent encounter for closed fracture with routine healing: Secondary | ICD-10-CM | POA: Diagnosis not present

## 2017-10-14 DIAGNOSIS — G250 Essential tremor: Secondary | ICD-10-CM | POA: Insufficient documentation

## 2017-10-15 ENCOUNTER — Encounter: Payer: Self-pay | Admitting: Family Medicine

## 2017-10-15 ENCOUNTER — Ambulatory Visit (INDEPENDENT_AMBULATORY_CARE_PROVIDER_SITE_OTHER): Payer: BLUE CROSS/BLUE SHIELD | Admitting: Family Medicine

## 2017-10-15 VITALS — BP 108/72 | HR 120 | Temp 98.0°F | Wt 321.2 lb

## 2017-10-15 DIAGNOSIS — S82201A Unspecified fracture of shaft of right tibia, initial encounter for closed fracture: Secondary | ICD-10-CM | POA: Diagnosis not present

## 2017-10-15 DIAGNOSIS — S6702XD Crushing injury of left thumb, subsequent encounter: Secondary | ICD-10-CM

## 2017-10-15 DIAGNOSIS — R5383 Other fatigue: Secondary | ICD-10-CM | POA: Diagnosis not present

## 2017-10-15 DIAGNOSIS — S82401A Unspecified fracture of shaft of right fibula, initial encounter for closed fracture: Secondary | ICD-10-CM | POA: Diagnosis not present

## 2017-10-15 NOTE — Patient Instructions (Signed)
Good to see you today.  Keep follow up with ortho tomorrow.
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
## 2017-10-16 ENCOUNTER — Encounter: Payer: Self-pay | Admitting: Family Medicine

## 2017-10-16 DIAGNOSIS — S82201S Unspecified fracture of shaft of right tibia, sequela: Secondary | ICD-10-CM | POA: Insufficient documentation

## 2017-10-16 DIAGNOSIS — S82401S Unspecified fracture of shaft of right fibula, sequela: Secondary | ICD-10-CM

## 2017-10-16 HISTORY — DX: Unspecified fracture of shaft of right tibia, sequela: S82.201S
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
## 2017-10-16 NOTE — Assessment & Plan Note (Addendum)
Fracture has healed well, however thumb nail remains poorly healing. Offered hand surgery referral - pt declines and would rather continue to monitor for now. Anticipate thumb nail will eventually fall off.

## 2017-10-16 NOTE — Assessment & Plan Note (Addendum)
Anticipate fatigue, tachycardia and relative hypotension related to recent blood loss anemia. Will reassess at f/u visit

## 2017-10-17 DIAGNOSIS — S82891D Other fracture of right lower leg, subsequent encounter for closed fracture with routine healing: Secondary | ICD-10-CM | POA: Diagnosis not present

## 2017-10-22 ENCOUNTER — Telehealth: Payer: Self-pay | Admitting: Family Medicine

## 2017-10-22 DIAGNOSIS — R251 Tremor, unspecified: Secondary | ICD-10-CM

## 2017-10-22 NOTE — Telephone Encounter (Signed)
Copied from CRM [id number]. Topic: General - Other >> Oct 22, 2017  2:30 PM Steve Lynch, Steve Lynch, [place] wrote: Reason for CRM: Pt called requesting referral to a neurologist for twitching in both hands  Please contact [phone]

## 2017-10-23 NOTE — Telephone Encounter (Signed)
Pt calling to check on referral status

## 2017-10-24 NOTE — Telephone Encounter (Signed)
Referral placed for tremors per patient request.

## 2017-10-24 NOTE — Telephone Encounter (Signed)
Appt made with Dr. Tat.  
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
## 2017-10-28 ENCOUNTER — Ambulatory Visit (INDEPENDENT_AMBULATORY_CARE_PROVIDER_SITE_OTHER): Payer: BLUE CROSS/BLUE SHIELD | Admitting: Neurology

## 2017-10-28 ENCOUNTER — Encounter: Payer: Self-pay | Admitting: Neurology

## 2017-10-28 VITALS — BP 116/76 | HR 100 | Ht 77.0 in | Wt 322.0 lb

## 2017-10-28 DIAGNOSIS — G250 Essential tremor: Secondary | ICD-10-CM | POA: Diagnosis not present

## 2017-10-28 DIAGNOSIS — G4733 Obstructive sleep apnea (adult) (pediatric): Secondary | ICD-10-CM

## 2017-11-25 ENCOUNTER — Ambulatory Visit: Payer: Worker's Compensation | Attending: Orthopedic Surgery | Admitting: Physical Therapy

## 2017-11-25 ENCOUNTER — Encounter: Payer: Self-pay | Admitting: Physical Therapy

## 2017-11-25 DIAGNOSIS — R262 Difficulty in walking, not elsewhere classified: Secondary | ICD-10-CM

## 2017-11-25 DIAGNOSIS — M6281 Muscle weakness (generalized): Secondary | ICD-10-CM | POA: Diagnosis present

## 2017-11-25 DIAGNOSIS — M25571 Pain in right ankle and joints of right foot: Secondary | ICD-10-CM | POA: Diagnosis present

## 2017-11-25 DIAGNOSIS — M25671 Stiffness of right ankle, not elsewhere classified: Secondary | ICD-10-CM | POA: Insufficient documentation
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
## 2017-11-27 ENCOUNTER — Ambulatory Visit: Payer: Worker's Compensation | Admitting: Physical Therapy

## 2017-11-27 ENCOUNTER — Encounter: Payer: Self-pay | Admitting: Physical Therapy

## 2017-11-27 DIAGNOSIS — R262 Difficulty in walking, not elsewhere classified: Secondary | ICD-10-CM

## 2017-11-27 DIAGNOSIS — M25571 Pain in right ankle and joints of right foot: Secondary | ICD-10-CM

## 2017-11-27 DIAGNOSIS — M6281 Muscle weakness (generalized): Secondary | ICD-10-CM

## 2017-11-27 DIAGNOSIS — M25671 Stiffness of right ankle, not elsewhere classified: Secondary | ICD-10-CM

## 2017-11-27 NOTE — Therapy (Signed)
[facility] [address] [place], [place], [address] Phone: [phone]   Fax:  [phone]  Physical Therapy Treatment  Patient Details  Name: Steve Lynch MRN: [id number] Date of Birth: 07/17/1956 Referring Provider: Dr. Jan FiremanPilson   Encounter Date: 11/27/2017  PT End of Session - 11/27/17 0828    Visit Number  2    Number of Visits  8    Date for PT Re-Evaluation  12/23/17    PT Start Time  0822    PT Stop Time  0927    PT Time Calculation (min)  65 min    Activity Tolerance  Patient tolerated treatment well;Patient limited by pain    Behavior During Therapy  [facility] for tasks assessed/performed       Past Medical History:  Diagnosis Date  . Drug abuse in remission 1990s   Cocaine with rehab  . Dyslipidemia    mild  . Peripheral vascular disease (HCC) 2004   varicose veins right leg  . Tendonitis 10/2010   temporal tendonitis with locked jaw following tooth extraction  . Vitamin B12 deficiency 09/2013    Past Surgical History:  Procedure Laterality Date  . BACK SURGERY  2005   "DISC  REPLACEMENT"   L5-6   . COLONOSCOPY  12/2011   TAx1, diverticulosis, rpt 5 yrs (Dr [name] @ [facility])  . KNEE ARTHROPLASTY Left 2012  . TONSILLECTOMY    . TOTAL KNEE ARTHROPLASTY  11/12/2011   Procedure: TOTAL KNEE ARTHROPLASTY;  Surgeon: Shelda PalMatthew D Olin, MD; Laterality: Right  . TOTAL KNEE ARTHROPLASTY Bilateral   . VARICOSE VEIN SURGERY Right    sclerotherapy    There were no vitals filed for this visit.  Subjective Assessment - 11/27/17 0826    Subjective  Pt. states he has been walking a bunch this morning and reports increase in R lower leg swelling/ pain (2-3/10) since getting out of bed.      Limitations  Standing;Walking;House hold activities;Other (comment)    Patient Stated Goals  Increase R ankle/lower leg strength and promote normalized gait pattern.  Decrease R ankle pain/ swelling.      Currently in Pain?  Yes     Pain Score  2     Pain Location  Ankle    Pain Orientation  Right    Pain Descriptors / Indicators  Aching;Throbbing    Pain Type  Surgical pain         Treatment:  There.ex.:  Scifit L6 5 min. Forward/ backwards (cuing to keep R heel in place)- warm-up/no charge.   Seated BAPS board: DF/PF/IV/EV 20x each (controlled movement).  CW/CCW. Reviewed HEP with BTB (good technique)- cuing to avoid increasing pain. Supine R quad set/ added SLR/ hip abduction with towel to decrease mat friction 10x2 each.   Supine R ankle isometrics 10x all 4-planes (pain tolerable)- limited with IV resistance.  Manual tx.:  Supine R hamstring stretches (prox,/ distal) 4x each. Supine R ankle stretches (all planes)- static holds. Reassessed R distal lower leg swelling/ tenderness.  Ice to R lower leg/ ankle with LE elevated on bolster 10 min.        PT Long Term Goals - 11/26/17 1729      PT LONG TERM GOAL #1   Title  Pt. independent with HEP to increase R ankle PF/IV AROM to WNL as compared to L to improve ankle mobility.      Baseline  R/L ankle PF: 45/48 deg.  IV 10/26 deg.      Time  4    Period  Weeks    Status  New    Target Date  12/23/17      PT LONG TERM GOAL #2   Title  Pt. will increase B ankle stability to WNL (PF strength to 4/5 MMT) to improve toe phase of gait/ step ups/downs with no pain.      Baseline  B ankle PF strength grossly 2+/5 MMT.  Pain limited.     Time  4    Period  Weeks    Status  New    Target Date  12/23/17      PT LONG TERM GOAL #3   Title  Pt. will increase FOTO score from 42 to 60 to improve pain-free mobility.      Baseline  FOTO score 42 on 11/25/17    Time  4    Period  Weeks    Status  New    Target Date  12/23/17      PT LONG TERM GOAL #4   Title  Pt. will ambulate community distances with normalized gait pattern and no c/o pain to improve pain-free mobility.      Baseline  R antalgic gait pattern/ no assistive device currently    Time  4     Period  Weeks    Status  New    Target Date  12/23/17            Plan - 11/27/17 0830    Clinical Impression Statement  Pt. unable to tolerate standing hip ex. with R LE wt. bearing due to pain.  PT focused on supine ankle/LE stretches and strengthening ex. (see new HEP).  Pt. continues to be limited with gait/ R LE wt. bearing during unassisted gait pattern.  Pain limited R ankle IV with light manual resistance/ isometrics.  Moderate R lower leg swelling present t/o tx. session and PT educated on benefits of elevating leg/ icing.       Clinical Presentation  Stable    Clinical Decision Making  Low    Rehab Potential  Good    PT Frequency  2x / week    PT Duration  4 weeks    PT Treatment/Interventions  ADLs/Self Care Home Management;Electrical Stimulation;Cryotherapy;Therapeutic activities;Functional mobility training;Stair training;Gait training;Therapeutic exercise;Balance training;Neuromuscular re-education;Manual techniques;Scar mobilization;Passive range of motion    PT Next Visit Plan  Increase R ankle ROM/ stability.  Progress HEP.       Patient will benefit from skilled therapeutic intervention in order to improve the following deficits and impairments:  Abnormal gait, Decreased balance, Decreased endurance, Decreased mobility, Difficulty walking, Hypomobility, Obesity, Decreased range of motion, Decreased activity tolerance, Decreased strength, Impaired flexibility  Visit Diagnosis: Stiffness of right ankle joint  Muscle weakness (generalized)  Pain in right ankle and joints of right foot  Difficulty in walking, not elsewhere classified     Problem List Patient Active Problem List   Diagnosis Date Noted  . Tibia/fibula fracture, right, closed, initial encounter 10/16/2017  . Crushing injury of left thumb 09/15/2017  . Mood disorder (HCC) 04/02/2017  . OSA on CPAP 08/13/2016  . Severe obesity (BMI >= 40) (HCC) 08/13/2016  . BPH associated with nocturia 08/13/2016   . Pedal edema 08/13/2016  . Dyslipidemia   . Healthcare maintenance 10/01/2013  . Vitamin B12 deficiency 09/13/2013  . Fatigue 08/16/2013   Cammie Mcgee, PT, DPT # [phone] 11/27/2017, 11:08 AM  [place]  [facility] [address] [place], [place], [address] Phone: [phone]   Fax:  [phone]  Name: Steve Lynch MRN: [id number] Date of Birth: 12-22-1955

## 2017-12-01 ENCOUNTER — Encounter: Payer: Self-pay | Admitting: Physical Therapy

## 2017-12-02 ENCOUNTER — Encounter: Payer: Self-pay | Admitting: Physical Therapy

## 2017-12-02 ENCOUNTER — Ambulatory Visit: Payer: Worker's Compensation | Admitting: Physical Therapy

## 2017-12-02 DIAGNOSIS — R262 Difficulty in walking, not elsewhere classified: Secondary | ICD-10-CM

## 2017-12-02 DIAGNOSIS — M25671 Stiffness of right ankle, not elsewhere classified: Secondary | ICD-10-CM | POA: Diagnosis not present

## 2017-12-02 DIAGNOSIS — M25571 Pain in right ankle and joints of right foot: Secondary | ICD-10-CM

## 2017-12-02 DIAGNOSIS — M6281 Muscle weakness (generalized): Secondary | ICD-10-CM

## 2017-12-02 NOTE — Therapy (Signed)
[facility] [address] [place], [place], [address] Phone: [phone]   Fax:  [phone]  Physical Therapy Treatment  Patient Details  Name: Steve Lynch MRN: [id number] Date of Birth: [birth year] Referring Provider: Dr. Jan Fireman   Encounter Date: 12/02/2017  PT End of Session - 12/02/17 1118    Visit Number  3    Number of Visits  8    Date for PT Re-Evaluation  12/23/17    PT Start Time  1112    PT Stop Time  1220    PT Time Calculation (min)  68 min    Activity Tolerance  Patient tolerated treatment well;Patient limited by pain       Past Medical History:  Diagnosis Date  . Drug abuse in remission 1990s   Cocaine with rehab  . Dyslipidemia    mild  . Peripheral vascular disease (HCC) 2004   varicose veins right leg  . Tendonitis 10/2010   temporal tendonitis with locked jaw following tooth extraction  . Vitamin B12 deficiency 09/2013    Past Surgical History:  Procedure Laterality Date  . BACK SURGERY  2005   "DISC  REPLACEMENT"   L5-6   . COLONOSCOPY  12/2011   TAx1, diverticulosis, rpt 5 yrs (Dr Myra Gianotti @ [facility])  . KNEE ARTHROPLASTY Left 2012  . TONSILLECTOMY    . TOTAL KNEE ARTHROPLASTY  11/12/2011   Procedure: TOTAL KNEE ARTHROPLASTY;  Surgeon: Shelda Pal, MD; Laterality: Right  . TOTAL KNEE ARTHROPLASTY Bilateral   . VARICOSE VEIN SURGERY Right    sclerotherapy    There were no vitals filed for this visit.  Subjective Assessment - 12/02/17 1117    Subjective  Pt. reports no pain, but pain does increase with prolonged sitting and improves with walking "a few steps". Pt. states compliance with HEP every other day and "keeping ankle active" throughout the day. Pt. states he has been busy this morning at work.  Pt. presents with significant R lower leg swelling, but he reports swelling does improve with prolonged elevation.  Pt. reports he has a head cold right now.      Limitations   Standing;Walking;House hold activities;Other (comment)    Patient Stated Goals  Increase R ankle/lower leg strength and promote normalized gait pattern.  Decrease R ankle pain/ swelling.      Currently in Pain?  No/denies      Treatment:  There.ex.:  Scifit L6 5 min. Forward/ backwards (cuing to keep R heel in place)- warm-up/no charge.   Standing on wobble borad dynamic stability in //-bars with minimal UE assist (PF/DF/Lat/CW/CCW) Seated BAPS board: DF/PF/IV/EV 20x each (controlled movement).  CW/CCW. 3" step down with 2 UE assist in //-bars x10 B Supine R ankle isometrics 10x all 4-planes (pain tolerable)- limited with PT resistance.   Manual tx.:  Supine R hamstring stretches (prox,/ distal) 4x each. Supine R ankle stretches (all planes)- static holds. Reassessed R distal lower leg swelling/ tenderness.   Ice to R lower leg/ ankle with LE elevated on bolster 10 min.       PT Long Term Goals - 11/26/17 1729      PT LONG TERM GOAL #1   Title  Pt. independent with HEP to increase R ankle PF/IV AROM to WNL as compared to L to improve ankle mobility.      Baseline  R/L ankle PF: 45/48 deg.  IV 10/26 deg.      Time  4    Period  Weeks    Status  New    Target Date  12/23/17      PT LONG TERM GOAL #2   Title  Pt. will increase B ankle stability to WNL (PF strength to 4/5 MMT) to improve toe phase of gait/ step ups/downs with no pain.      Baseline  B ankle PF strength grossly 2+/5 MMT.  Pain limited.     Time  4    Period  Weeks    Status  New    Target Date  12/23/17      PT LONG TERM GOAL #3   Title  Pt. will increase FOTO score from 42 to 60 to improve pain-free mobility.      Baseline  FOTO score 42 on 11/25/17    Time  4    Period  Weeks    Status  New    Target Date  12/23/17      PT LONG TERM GOAL #4   Title  Pt. will ambulate community distances with normalized gait pattern and no c/o pain to improve pain-free mobility.      Baseline  R antalgic gait pattern/  no assistive device currently    Time  4    Period  Weeks    Status  New    Target Date  12/23/17         Plan - 12/02/17 1754    Clinical Impression Statement  Pt. continues to progress well with ankle strengthening and ROM, despite continued lower limb swelling. PT will reassess swelling next visit via circumferential measurements. Pt. still has marked difficulty with ankle PF/inversion strength and ROM as well as step down control.  Moderate R antalgic gait pattern, esp. after prolonged sitting.      Clinical Presentation  Stable    Clinical Decision Making  Low    Rehab Potential  Good    PT Frequency  2x / week    PT Duration  4 weeks    PT Treatment/Interventions  ADLs/Self Care Home Management;Electrical Stimulation;Cryotherapy;Therapeutic activities;Functional mobility training;Stair training;Gait training;Therapeutic exercise;Balance training;Neuromuscular re-education;Manual techniques;Scar mobilization;Passive range of motion    PT Next Visit Plan  Reassess ankle swelling. Increase R ankle ROM/ stability.  Progress HEP.       Patient will benefit from skilled therapeutic intervention in order to improve the following deficits and impairments:  Abnormal gait, Decreased balance, Decreased endurance, Decreased mobility, Difficulty walking, Hypomobility, Obesity, Decreased range of motion, Decreased activity tolerance, Decreased strength, Impaired flexibility  Visit Diagnosis: Stiffness of right ankle joint  Muscle weakness (generalized)  Pain in right ankle and joints of right foot  Difficulty in walking, not elsewhere classified     Problem List Patient Active Problem List   Diagnosis Date Noted  . Tibia/fibula fracture, right, closed, initial encounter 10/16/2017  . Crushing injury of left thumb 09/15/2017  . Mood disorder (HCC) 04/02/2017  . OSA on CPAP 08/13/2016  . Severe obesity (BMI >= 40) (HCC) 08/13/2016  . BPH associated with nocturia 08/13/2016  . Pedal  edema 08/13/2016  . Dyslipidemia   . Healthcare maintenance 10/01/2013  . Vitamin B12 deficiency 09/13/2013  . Fatigue 08/16/2013   Cammie McgeeMichael C Sherk, PT, DPT # [phone] 12/02/2017, 6:20 PM  [facility] [address] [place], [place], [address] Phone: [phone]   Fax:  [phone]  Name: Steve Lynch MRN: [id number] Date of Birth: 02/21/1956

## 2017-12-04 ENCOUNTER — Ambulatory Visit: Payer: Worker's Compensation | Admitting: Physical Therapy

## 2017-12-04 DIAGNOSIS — M25671 Stiffness of right ankle, not elsewhere classified: Secondary | ICD-10-CM

## 2017-12-04 DIAGNOSIS — M25571 Pain in right ankle and joints of right foot: Secondary | ICD-10-CM

## 2017-12-04 DIAGNOSIS — M6281 Muscle weakness (generalized): Secondary | ICD-10-CM

## 2017-12-04 DIAGNOSIS — R262 Difficulty in walking, not elsewhere classified: Secondary | ICD-10-CM

## 2017-12-04 NOTE — Therapy (Signed)
[place] [facility] [address] [place], [place], [address] Phone: [phone]   Fax:  [phone]  Physical Therapy Treatment  Patient Details  Name: Steve Lynch MRN: [id number] Date of Birth: 04/03/1956 Referring Provider: Dr. Jan FiremanPilson   Encounter Date: 12/04/2017  PT End of Session - 12/04/17 0837    Visit Number  4    Number of Visits  8    Date for PT Re-Evaluation  12/23/17    PT Start Time  0818    PT Stop Time  0933    PT Time Calculation (min)  75 min    Activity Tolerance  Patient tolerated treatment well    Behavior During Therapy  [facility] for tasks assessed/performed       Past Medical History:  Diagnosis Date  . Drug abuse in remission 1990s   Cocaine with rehab  . Dyslipidemia    mild  . Peripheral vascular disease (HCC) 2004   varicose veins right leg  . Tendonitis 10/2010   temporal tendonitis with locked jaw following tooth extraction  . Vitamin B12 deficiency 09/2013    Past Surgical History:  Procedure Laterality Date  . BACK SURGERY  2005   "DISC  REPLACEMENT"   L5-6   . COLONOSCOPY  12/2011   TAx1, diverticulosis, rpt 5 yrs (Dr [name] @ [facility])  . KNEE ARTHROPLASTY Left 2012  . TONSILLECTOMY    . TOTAL KNEE ARTHROPLASTY  11/12/2011   Procedure: TOTAL KNEE ARTHROPLASTY;  Surgeon: Shelda PalMatthew D Olin, MD; Laterality: Right  . TOTAL KNEE ARTHROPLASTY Bilateral   . VARICOSE VEIN SURGERY Right    sclerotherapy    There were no vitals filed for this visit.  Subjective Assessment - 12/04/17 0837    Subjective  Pt. reports no pain at this time but states he has increase swelling in R knee joint (medial aspect).      Limitations  Standing;Walking;House hold activities;Other (comment)    Patient Stated Goals  Increase R ankle/lower leg strength and promote normalized gait pattern.  Decrease R ankle pain/ swelling.      Currently in Pain?  No/denies        Treatment:  There.ex.: Scifit L6 5 min.  Forward/ backwards (cuing to keep R heel in place)- warm-up/no charge.  Standing on wobble board dynamic stability in //-bars with minimal UE assist (PF/DF/Lat/CW/CCW) Attempted standing heel raises but limited by weakness at stairs and TG Standing Airex: partial lunge on R (forward/ lateral).   Standing hamstring stretches at stairs. Supine R ankle isometrics 10x all 4-planes (pain tolerable)- Mod. Resistance to PF  Manual tx.:  Supine R hamstring stretches (prox,/ distal) 7x each. Supine R ankle stretches (all planes)- static holds.   Ice to R lower leg/ ankle with LE elevated on bolster 10 min.     PT Long Term Goals - 11/26/17 1729      PT LONG TERM GOAL #1   Title  Pt. independent with HEP to increase R ankle PF/IV AROM to WNL as compared to L to improve ankle mobility.      Baseline  R/L ankle PF: 45/48 deg.  IV 10/26 deg.      Time  4    Period  Weeks    Status  New    Target Date  12/23/17      PT LONG TERM GOAL #2   Title  Pt. will increase B ankle stability to WNL (PF strength to 4/5 MMT) to  improve toe phase of gait/ step ups/downs with no pain.      Baseline  B ankle PF strength grossly 2+/5 MMT.  Pain limited.     Time  4    Period  Weeks    Status  New    Target Date  12/23/17      PT LONG TERM GOAL #3   Title  Pt. will increase FOTO score from 42 to 60 to improve pain-free mobility.      Baseline  FOTO score 42 on 11/25/17    Time  4    Period  Weeks    Status  New    Target Date  12/23/17      PT LONG TERM GOAL #4   Title  Pt. will ambulate community distances with normalized gait pattern and no c/o pain to improve pain-free mobility.      Baseline  R antalgic gait pattern/ no assistive device currently    Time  4    Period  Weeks    Status  New    Target Date  12/23/17          Plan - 12/04/17 0834    Clinical Impression Statement  Circumferential measurements: distal tib.: R 31 cm./ L 26 cm.  Knee joint line: R 53 cm./ L 50 cm.  Slight  improvement in R lower leg/ ankle swelling as compared to initial evaluation.  Pt. unable to complete proper standing heel raises at this time and PT added a few supine based strengthening/ standing hamstring stretches.        Clinical Presentation  Stable    Clinical Decision Making  Low    Rehab Potential  Good    PT Frequency  2x / week    PT Duration  4 weeks    PT Treatment/Interventions  ADLs/Self Care Home Management;Electrical Stimulation;Cryotherapy;Therapeutic activities;Functional mobility training;Stair training;Gait training;Therapeutic exercise;Balance training;Neuromuscular re-education;Manual techniques;Scar mobilization;Passive range of motion    PT Next Visit Plan  Increase R ankle ROM/ stability.        Patient will benefit from skilled therapeutic intervention in order to improve the following deficits and impairments:  Abnormal gait, Decreased balance, Decreased endurance, Decreased mobility, Difficulty walking, Hypomobility, Obesity, Decreased range of motion, Decreased activity tolerance, Decreased strength, Impaired flexibility  Visit Diagnosis: Stiffness of right ankle joint  Muscle weakness (generalized)  Pain in right ankle and joints of right foot  Difficulty in walking, not elsewhere classified     Problem List Patient Active Problem List   Diagnosis Date Noted  . Tibia/fibula fracture, right, closed, initial encounter 10/16/2017  . Crushing injury of left thumb 09/15/2017  . Mood disorder (HCC) 04/02/2017  . OSA on CPAP 08/13/2016  . Severe obesity (BMI >= 40) (HCC) 08/13/2016  . BPH associated with nocturia 08/13/2016  . Pedal edema 08/13/2016  . Dyslipidemia   . Healthcare maintenance 10/01/2013  . Vitamin B12 deficiency 09/13/2013  . Fatigue 08/16/2013   Cammie Mcgee, PT, DPT # [phone] 12/04/2017, 11:43 AM  [place] [facility] [address] [place], [place], [address] Phone: [phone]   Fax:   [phone]  Name: Steve Lynch Deviney Ells MRN: [id number] Date of Birth: 1956/10/13

## 2017-12-08 ENCOUNTER — Encounter: Payer: Self-pay | Admitting: Physical Therapy

## 2017-12-08 ENCOUNTER — Ambulatory Visit: Payer: Worker's Compensation | Admitting: Physical Therapy

## 2017-12-08 DIAGNOSIS — M25671 Stiffness of right ankle, not elsewhere classified: Secondary | ICD-10-CM | POA: Diagnosis not present

## 2017-12-08 DIAGNOSIS — M6281 Muscle weakness (generalized): Secondary | ICD-10-CM

## 2017-12-08 DIAGNOSIS — M25571 Pain in right ankle and joints of right foot: Secondary | ICD-10-CM

## 2017-12-08 DIAGNOSIS — R262 Difficulty in walking, not elsewhere classified: Secondary | ICD-10-CM

## 2017-12-09 NOTE — Therapy (Signed)
[place] [facility] [address] [place], [place], [address] Phone: [phone]   Fax:  [phone]  Physical Therapy Treatment  Patient Details  Name: Steve Lynch MRN: [id number] Date of Birth: 14-Jul-1956 Referring Provider: Dr. Jan Fireman   Encounter Date: 12/08/2017  PT End of Session - 12/08/17 1423    Visit Number  5    Number of Visits  8    Date for PT Re-Evaluation  12/23/17    PT Start Time  0812    PT Stop Time  0911    PT Time Calculation (min)  59 min    Activity Tolerance  Patient tolerated treatment well    Behavior During Therapy  [facility] for tasks assessed/performed       Past Medical History:  Diagnosis Date  . Drug abuse in remission 1990s   Cocaine with rehab  . Dyslipidemia    mild  . Peripheral vascular disease (HCC) 2004   varicose veins right leg  . Tendonitis 10/2010   temporal tendonitis with locked jaw following tooth extraction  . Vitamin B12 deficiency 09/2013    Past Surgical History:  Procedure Laterality Date  . BACK SURGERY  2005   "DISC  REPLACEMENT"   L5-6   . COLONOSCOPY  12/2011   TAx1, diverticulosis, rpt 5 yrs (Dr Myra Gianotti @ [facility])  . KNEE ARTHROPLASTY Left 2012  . TONSILLECTOMY    . TOTAL KNEE ARTHROPLASTY  11/12/2011   Procedure: TOTAL KNEE ARTHROPLASTY;  Surgeon: Shelda Pal, MD; Laterality: Right  . TOTAL KNEE ARTHROPLASTY Bilateral   . VARICOSE VEIN SURGERY Right    sclerotherapy    There were no vitals filed for this visit.  Subjective Assessment - 12/08/17 1421    Subjective  Pt. reports no pain 0/10 NPS today, and reduced swelling. Pt. states compliance with HEP, but he is not doing resisted ankle motion instead just AROM all planes.  Pt. says that he has been experiencing pain less frequently, but when he does it "comes and goes for no reason" in R knee and ankle.    Limitations  Standing;Walking;House hold activities;Other (comment)    Patient Stated Goals   Increase R ankle/lower leg strength and promote normalized gait pattern.  Decrease R ankle pain/ swelling.      Currently in Pain?  No/denies    Pain Score  0-No pain        Treatment:  There.ex.:  Scifit L6 5 min. Forward/ backwards (cuing to keep R heel in place)- warm-up/no charge.   Standing on wobble board dynamic stability in //-bars with minimal UE assist (PF/DF/Lat/CW/CCW) Standing heel raise on 3" step in //-bars 2 UE assist x5 still limited but increased from last visit Standing Airex: partial lunge on R (forward/ lateral).   Standing hamstring stretches at stairs.  Manual tx.:  Supine R ankle stretches (all planes)- static holds. Supine R ankle isometrics for PF and DF with LE elevated on bolster x20    Ice to R lower leg/ ankle with LE elevated on bolster 10 min.       PT Long Term Goals - 11/26/17 1729      PT LONG TERM GOAL #1   Title  Pt. independent with HEP to increase R ankle PF/IV AROM to WNL as compared to L to improve ankle mobility.      Baseline  R/L ankle PF: 45/48 deg.  IV 10/26 deg.      Time  4  Period  Weeks    Status  New    Target Date  12/23/17      PT LONG TERM GOAL #2   Title  Pt. will increase B ankle stability to WNL (PF strength to 4/5 MMT) to improve toe phase of gait/ step ups/downs with no pain.      Baseline  B ankle PF strength grossly 2+/5 MMT.  Pain limited.     Time  4    Period  Weeks    Status  New    Target Date  12/23/17      PT LONG TERM GOAL #3   Title  Pt. will increase FOTO score from 42 to 60 to improve pain-free mobility.      Baseline  FOTO score 42 on 11/25/17    Time  4    Period  Weeks    Status  New    Target Date  12/23/17      PT LONG TERM GOAL #4   Title  Pt. will ambulate community distances with normalized gait pattern and no c/o pain to improve pain-free mobility.      Baseline  R antalgic gait pattern/ no assistive device currently    Time  4    Period  Weeks    Status  New    Target Date   12/23/17            Plan - 12/08/17 0754    Clinical Impression Statement  Pt. continues to improve with weight bearing ankle strength/ROM and PT will progress. Pt. still has marked limitation with R ankle DF ROM and PF strength. PT educated Pt. on adding resistance with HEP to improve strength with PF. PT educated pt. on compression for swelling, but pt. states he is unlikely to try compression stockings. Pt. will continue to benefit from dynamic ankle stability and gross LE strengthening.    Clinical Presentation  Stable    Clinical Decision Making  Low    Rehab Potential  Good    PT Frequency  2x / week    PT Duration  4 weeks    PT Treatment/Interventions  ADLs/Self Care Home Management;Electrical Stimulation;Cryotherapy;Therapeutic activities;Functional mobility training;Stair training;Gait training;Therapeutic exercise;Balance training;Neuromuscular re-education;Manual techniques;Scar mobilization;Passive range of motion    PT Next Visit Plan  Increase R ankle ROM/ stability.        Patient will benefit from skilled therapeutic intervention in order to improve the following deficits and impairments:  Abnormal gait, Decreased balance, Decreased endurance, Decreased mobility, Difficulty walking, Hypomobility, Obesity, Decreased range of motion, Decreased activity tolerance, Decreased strength, Impaired flexibility  Visit Diagnosis: Stiffness of right ankle joint  Muscle weakness (generalized)  Pain in right ankle and joints of right foot  Difficulty in walking, not elsewhere classified     Problem List Patient Active Problem List   Diagnosis Date Noted  . Tibia/fibula fracture, right, closed, initial encounter 10/16/2017  . Crushing injury of left thumb 09/15/2017  . Mood disorder (HCC) 04/02/2017  . OSA on CPAP 08/13/2016  . Severe obesity (BMI >= 40) (HCC) 08/13/2016  . BPH associated with nocturia 08/13/2016  . Pedal edema 08/13/2016  . Dyslipidemia   . Healthcare  maintenance 10/01/2013  . Vitamin B12 deficiency 09/13/2013  . Fatigue 08/16/2013   Cammie McgeeMichael C Sherk, PT, DPT # [phone] 12/09/2017, 3:10 PM  [facility] [address] [place], [place], [address] Phone: [phone]   Fax:  [phone]  Name: Steve Lynch MRN: [id number] Date of Birth: [birth year]

## 2017-12-11 ENCOUNTER — Ambulatory Visit: Payer: Worker's Compensation | Admitting: Physical Therapy

## 2017-12-11 ENCOUNTER — Encounter: Payer: Self-pay | Admitting: Physical Therapy

## 2017-12-11 DIAGNOSIS — R262 Difficulty in walking, not elsewhere classified: Secondary | ICD-10-CM

## 2017-12-11 DIAGNOSIS — M25571 Pain in right ankle and joints of right foot: Secondary | ICD-10-CM

## 2017-12-11 DIAGNOSIS — M25671 Stiffness of right ankle, not elsewhere classified: Secondary | ICD-10-CM

## 2017-12-11 DIAGNOSIS — M6281 Muscle weakness (generalized): Secondary | ICD-10-CM

## 2017-12-11 NOTE — Therapy (Signed)
[facility] [address] [place], [place], [address] Phone: [phone]   Fax:  [phone]  Physical Therapy Treatment  Patient Details  Name: Steve Lynch MRN: [id number] Date of Birth: 12/01/1955 Referring Provider: Dr. Jan FiremanPilson   Encounter Date: 12/11/2017  PT End of Session - 12/11/17 [id number]    Visit Number  6    Number of Visits  8    Date for PT Re-Evaluation  12/23/17    PT Start Time  0814    PT Stop Time  0915    PT Time Calculation (min)  61 min    Activity Tolerance  Patient tolerated treatment well    Behavior During Therapy  [facility] for tasks assessed/performed       Past Medical History:  Diagnosis Date  . Drug abuse in remission 1990s   Cocaine with rehab  . Dyslipidemia    mild  . Peripheral vascular disease (HCC) 2004   varicose veins right leg  . Tendonitis 10/2010   temporal tendonitis with locked jaw following tooth extraction  . Vitamin B12 deficiency 09/2013    Past Surgical History:  Procedure Laterality Date  . BACK SURGERY  2005   "DISC  REPLACEMENT"   L5-6   . COLONOSCOPY  12/2011   TAx1, diverticulosis, rpt 5 yrs (Dr [name] [facility])  . KNEE ARTHROPLASTY Left 2012  . TONSILLECTOMY    . TOTAL KNEE ARTHROPLASTY  11/12/2011   Procedure: TOTAL KNEE ARTHROPLASTY;  Surgeon: Shelda PalMatthew D Olin, MD; Laterality: Right  . TOTAL KNEE ARTHROPLASTY Bilateral   . VARICOSE VEIN SURGERY Right    sclerotherapy    There were no vitals filed for this visit.  Subjective Assessment - 12/11/17 [id number]    Subjective  Pt. states "knee and ankle feel alright" with no pain and "decreased swelling" 0/10 NPS current. Pt. reports increased walking "the last couple days" because of funeral for wife's aunt. Pt. reports increased hamstring stretching and ankle AROM in seated, but denies compliance with HEP otherwise.    Limitations  Standing;Walking;House hold activities;Other (comment)    Patient Stated Goals   Increase R ankle/lower leg strength and promote normalized gait pattern.  Decrease R ankle pain/ swelling.      Currently in Pain?  No/denies         There.ex.:   Scifit L6 5 min. Forward/ backwards (cuing to keep R heel in place)- warm-up/no charge.   Heel and toe walking in //-bars x3 Resisted walking in //-bars 2 BTB forward, backward, lateral x5 each Standing on wobble board dynamic stability in //-bars with minimal UE assist (PF/DF/Lat/CW/CCW) Standing Airex: partial lunge on R (forward/ lateral). TG level 22 with PT holding handle for SBA x 10- B squats, heel raise, and B squats on airex pad      Manual tx.:  Supine PT assisted hamstring stretch Supine R ankle stretches (all planes)- static holds. Supine R ankle isometrics for PF and PF with inv/env LE elevated on bolster x20   Ice to R ankle in supine elevated position after tx. Session.    PT Long Term Goals - 11/26/17 1729      PT LONG TERM GOAL #1   Title  Pt. independent with HEP to increase R ankle PF/IV AROM to WNL as compared to L to improve ankle mobility.      Baseline  R/L ankle PF: 45/48 deg.  IV 10/26 deg.      Time  4  Period  Weeks    Status  New    Target Date  12/23/17      PT LONG TERM GOAL #2   Title  Pt. will increase B ankle stability to WNL (PF strength to 4/5 MMT) to improve toe phase of gait/ step ups/downs with no pain.      Baseline  B ankle PF strength grossly 2+/5 MMT.  Pain limited.     Time  4    Period  Weeks    Status  New    Target Date  12/23/17      PT LONG TERM GOAL #3   Title  Pt. will increase FOTO score from 42 to 60 to improve pain-free mobility.      Baseline  FOTO score 42 on 11/25/17    Time  4    Period  Weeks    Status  New    Target Date  12/23/17      PT LONG TERM GOAL #4   Title  Pt. will ambulate community distances with normalized gait pattern and no c/o pain to improve pain-free mobility.      Baseline  R antalgic gait pattern/ no assistive device currently     Time  4    Period  Weeks    Status  New    Target Date  12/23/17            Plan - 12/11/17 4098    Clinical Impression Statement  Pt. has marked decreased swelling today on R ankle, but increased swelling in knee. Pt. walks with mild antalgic gait. Pt. chief deficits include ankle PF strength and hip abduction strength. Pt. tolerated treatment well with no increased pain, but had difficult time with resisted walking 2 BTB and PF on TG level 22.    Clinical Presentation  Stable    Clinical Decision Making  Low    Rehab Potential  Good    PT Frequency  2x / week    PT Duration  4 weeks    PT Treatment/Interventions  ADLs/Self Care Home Management;Electrical Stimulation;Cryotherapy;Therapeutic activities;Functional mobility training;Stair training;Gait training;Therapeutic exercise;Balance training;Neuromuscular re-education;Manual techniques;Scar mobilization;Passive range of motion    PT Next Visit Plan  Increase R ankle ROM/ stability.        Patient will benefit from skilled therapeutic intervention in order to improve the following deficits and impairments:  Abnormal gait, Decreased balance, Decreased endurance, Decreased mobility, Difficulty walking, Hypomobility, Obesity, Decreased range of motion, Decreased activity tolerance, Decreased strength, Impaired flexibility  Visit Diagnosis: Stiffness of right ankle joint  Muscle weakness (generalized)  Pain in right ankle and joints of right foot  Difficulty in walking, not elsewhere classified     Problem List Patient Active Problem List   Diagnosis Date Noted  . Tibia/fibula fracture, right, closed, initial encounter 10/16/2017  . Crushing injury of left thumb 09/15/2017  . Mood disorder (HCC) 04/02/2017  . OSA on CPAP 08/13/2016  . Severe obesity (BMI >= 40) (HCC) 08/13/2016  . BPH associated with nocturia 08/13/2016  . Pedal edema 08/13/2016  . Dyslipidemia   . Healthcare maintenance 10/01/2013  . Vitamin B12  deficiency 09/13/2013  . Fatigue 08/16/2013   Cammie Mcgee, PT, DPT # [id number] 12/11/2017, 9:22 AM  [facility] [address] [place], [place], [address] Phone: [phone]   Fax:  [phone]  Name: Steve Lynch MRN: [id number] Date of Birth: September 10, 1956

## 2017-12-15 ENCOUNTER — Ambulatory Visit: Payer: Worker's Compensation | Attending: Orthopedic Surgery | Admitting: Physical Therapy

## 2017-12-15 ENCOUNTER — Encounter: Payer: Self-pay | Admitting: Physical Therapy

## 2017-12-15 DIAGNOSIS — R262 Difficulty in walking, not elsewhere classified: Secondary | ICD-10-CM | POA: Insufficient documentation

## 2017-12-15 DIAGNOSIS — M25671 Stiffness of right ankle, not elsewhere classified: Secondary | ICD-10-CM | POA: Diagnosis not present

## 2017-12-15 DIAGNOSIS — M6281 Muscle weakness (generalized): Secondary | ICD-10-CM | POA: Insufficient documentation

## 2017-12-15 DIAGNOSIS — M25571 Pain in right ankle and joints of right foot: Secondary | ICD-10-CM | POA: Diagnosis present

## 2017-12-15 NOTE — Therapy (Signed)
[facility] [address] [place], [place], [address] Phone: [phone]   Fax:  [phone]  Physical Therapy Treatment  Patient Details  Name: Steve Lynch MRN: [id number] Date of Birth: 11-May-1956 Referring Provider: Dr. Jan Fireman   Encounter Date: 12/15/2017  PT End of Session - 12/15/17 1249    Visit Number  7    Number of Visits  8    Date for PT Re-Evaluation  12/23/17    PT Start Time  0814    PT Stop Time  0910    PT Time Calculation (min)  56 min    Activity Tolerance  Patient tolerated treatment well    Behavior During Therapy  [facility] for tasks assessed/performed       Past Medical History:  Diagnosis Date  . Drug abuse in remission 1990s   Cocaine with rehab  . Dyslipidemia    mild  . Peripheral vascular disease (HCC) 2004   varicose veins right leg  . Tendonitis 10/2010   temporal tendonitis with locked jaw following tooth extraction  . Vitamin B12 deficiency 09/2013    Past Surgical History:  Procedure Laterality Date  . BACK SURGERY  2005   "DISC  REPLACEMENT"   L5-6   . COLONOSCOPY  12/2011   TAx1, diverticulosis, rpt 5 yrs (Dr Myra Gianotti @ [facility])  . KNEE ARTHROPLASTY Left 2012  . TONSILLECTOMY    . TOTAL KNEE ARTHROPLASTY  11/12/2011   Procedure: TOTAL KNEE ARTHROPLASTY;  Surgeon: Shelda Pal, MD; Laterality: Right  . TOTAL KNEE ARTHROPLASTY Bilateral   . VARICOSE VEIN SURGERY Right    sclerotherapy    There were no vitals filed for this visit.  Subjective Assessment - 12/15/17 0818    Subjective  No new complaints.  Pt. ordered compression stockings from [employer] and they shoulder arrive today.  Pts. R knee/ankle swelling continues to worsen with increase standing/walking.      Limitations  Standing;Walking;House hold activities;Other (comment)    Patient Stated Goals  Increase R ankle/lower leg strength and promote normalized gait pattern.  Decrease R ankle pain/ swelling.      Currently  in Pain?  No/denies        There.ex.:   Scifit L7 min. Forward/ backwards (cuing to keep R heel in place)- warm-up/no charge.  Heel and toe walking in //-bars x3/ lateral braiding 2x each. Resisted walking in //-bars 2 BTB forward, backward, lateral x5 each BOSU partial lunges TG level 22 with PT holding handle for SBA x 10- B squats, heel raise, and B squats on airex pad    Manual tx.:   Supine PT assisted hamstring stretch Supine R ankle stretches (all planes)- static holds. Supine R ankle isometrics for DF/PF and PF with inv/env LE elevated on bolster x20  Ice to R ankle in supine elevated position after tx. Session.       PT Long Term Goals - 11/26/17 1729      PT LONG TERM GOAL #1   Title  Pt. independent with HEP to increase R ankle PF/IV AROM to WNL as compared to L to improve ankle mobility.      Baseline  R/L ankle PF: 45/48 deg.  IV 10/26 deg.      Time  4    Period  Weeks    Status  New    Target Date  12/23/17      PT LONG TERM GOAL #2   Title  Pt.  will increase B ankle stability to WNL (PF strength to 4/5 MMT) to improve toe phase of gait/ step ups/downs with no pain.      Baseline  B ankle PF strength grossly 2+/5 MMT.  Pain limited.     Time  4    Period  Weeks    Status  New    Target Date  12/23/17      PT LONG TERM GOAL #3   Title  Pt. will increase FOTO score from 42 to 60 to improve pain-free mobility.      Baseline  FOTO score 42 on 11/25/17    Time  4    Period  Weeks    Status  New    Target Date  12/23/17      PT LONG TERM GOAL #4   Title  Pt. will ambulate community distances with normalized gait pattern and no c/o pain to improve pain-free mobility.      Baseline  R antalgic gait pattern/ no assistive device currently    Time  4    Period  Weeks    Status  New    Target Date  12/23/17            Plan - 12/15/17 1250    Clinical Impression Statement  R lower leg/ ankle swelling continues to worsen t/o tx. session but  resolves quickly in supine position with LE elevated and ice.  Marked improvement in tandem gait pattern with no UE assist and progression with more normalized gait pattern/ heel strike.  No increase c/o pain and good talus mobility noted during BOSU lunges.      Clinical Presentation  Stable    Clinical Decision Making  Low    Rehab Potential  Good    PT Frequency  2x / week    PT Duration  4 weeks    PT Treatment/Interventions  ADLs/Self Care Home Management;Electrical Stimulation;Cryotherapy;Therapeutic activities;Functional mobility training;Stair training;Gait training;Therapeutic exercise;Balance training;Neuromuscular re-education;Manual techniques;Scar mobilization;Passive range of motion    PT Next Visit Plan  Increase R ankle ROM/ stability.    CHECK GOALS.  RECERT vs. DISCHARGE.         Patient will benefit from skilled therapeutic intervention in order to improve the following deficits and impairments:  Abnormal gait, Decreased balance, Decreased endurance, Decreased mobility, Difficulty walking, Hypomobility, Obesity, Decreased range of motion, Decreased activity tolerance, Decreased strength, Impaired flexibility  Visit Diagnosis: Stiffness of right ankle joint  Muscle weakness (generalized)  Pain in right ankle and joints of right foot  Difficulty in walking, not elsewhere classified     Problem List Patient Active Problem List   Diagnosis Date Noted  . Tibia/fibula fracture, right, closed, initial encounter 10/16/2017  . Crushing injury of left thumb 09/15/2017  . Mood disorder (HCC) 04/02/2017  . OSA on CPAP 08/13/2016  . Severe obesity (BMI >= 40) (HCC) 08/13/2016  . BPH associated with nocturia 08/13/2016  . Pedal edema 08/13/2016  . Dyslipidemia   . Healthcare maintenance 10/01/2013  . Vitamin B12 deficiency 09/13/2013  . Fatigue 08/16/2013   Cammie McgeeMichael C Kelsie Kramp, PT, DPT # [id number] 12/15/2017, 2:15 PM  [facility] [address] [place], [place], [address] Phone: [phone]   Fax:  [phone]  Name: Steve Lynch MRN: [id number] Date of Birth: 03/30/1956

## 2017-12-18 ENCOUNTER — Ambulatory Visit: Payer: Worker's Compensation | Admitting: Physical Therapy

## 2017-12-18 ENCOUNTER — Encounter: Payer: Self-pay | Admitting: Physical Therapy

## 2017-12-18 DIAGNOSIS — M25671 Stiffness of right ankle, not elsewhere classified: Secondary | ICD-10-CM

## 2017-12-18 DIAGNOSIS — M25571 Pain in right ankle and joints of right foot: Secondary | ICD-10-CM

## 2017-12-18 DIAGNOSIS — R262 Difficulty in walking, not elsewhere classified: Secondary | ICD-10-CM

## 2017-12-18 DIAGNOSIS — M6281 Muscle weakness (generalized): Secondary | ICD-10-CM

## 2017-12-18 NOTE — Therapy (Addendum)
[place] [facility] [address] [place], [place], [address] Phone: [phone]   Fax:  [phone]  Physical Therapy Treatment  Patient Details  Name: Steve Lynch MRN: [id number] Date of Birth: 11/25/1955 Referring Provider: Dr. Jan Fireman   Encounter Date: 12/18/2017  PT End of Session - 12/18/17 0944    Visit Number  8    Number of Visits  8    Date for PT Re-Evaluation  12/23/17    PT Start Time  0812    PT Stop Time  0929    PT Time Calculation (min)  77 min    Activity Tolerance  Patient tolerated treatment well    Behavior During Therapy  [facility] for tasks assessed/performed       Past Medical History:  Diagnosis Date  . Drug abuse in remission 1990s   Cocaine with rehab  . Dyslipidemia    mild  . Peripheral vascular disease (HCC) 2004   varicose veins right leg  . Tendonitis 10/2010   temporal tendonitis with locked jaw following tooth extraction  . Vitamin B12 deficiency 09/2013    Past Surgical History:  Procedure Laterality Date  . BACK SURGERY  2005   "DISC  REPLACEMENT"   L5-6   . COLONOSCOPY  12/2011   TAx1, diverticulosis, rpt 5 yrs (Dr Myra Gianotti @ [facility])  . KNEE ARTHROPLASTY Left 2012  . TONSILLECTOMY    . TOTAL KNEE ARTHROPLASTY  11/12/2011   Procedure: TOTAL KNEE ARTHROPLASTY;  Surgeon: Shelda Pal, MD; Laterality: Right  . TOTAL KNEE ARTHROPLASTY Bilateral   . VARICOSE VEIN SURGERY Right    sclerotherapy    There were no vitals filed for this visit.  Subjective Assessment - 12/18/17 0936    Subjective  Patient compression stockings arrived today. 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 Reports occasional mild "ghost pain" that comes on without apparent cause and resolves quickly.    Limitations  Standing;Walking;House hold activities;Other (comment)    Patient Stated Goals  Increase R ankle/lower leg strength and promote normalized gait pattern.  Decrease R  ankle pain/ swelling.      Currently in Pain?  Yes    Pain Score  0-No pain    Multiple Pain Sites  No         TREATMENT  Donned compression stocking on RLE with difficulty. Patient advised concerning appropriate care (removing stocking while sleeping/inactive).  Manual therapy: 4-way RLE ankle stretching 3 x 30 sec. Patient demonstrates limitation in eversion but achieves near-normal range in other physiological motions. Pain  over surgical plate with stretch into eversion, resolved with change in therapist hand placement. Supine BLE hamstring stretch B 3 x 30 sec   Therapeutic exercise: 4-way ankle isometrics 5 sec hold x 10  Ambulation 200 ft: patient demonstrates near-normal reciprocal gait with heel strike and good foot clearance; mild L-sided trendelenberg.   // bars: BOSU blue-side up lunges 2 x 10 B with occasional UE assist BOSU black-side up B ankle DF/PF 2 x 10 BOSU black-side up weight shifting with knee bends over BLE x 20  Over 1/2 foam: ankle DF/PF x 10. Patient able to complete without UE assist. Patient complaint of tightness and mild pain in lateral calf.  Manual therapy: LLE gastroc stretch in supine with ER 2 x 30 sec, with IR 2 x 20 sec; patient reports resolution of tightness/pain. LLE SLR x 10 patient reports minor pain in low  back, possibly due to hamstring tightness  Ascend/descend 4 steps x 6; patient demonstrates normal reciprocal gait on stairs with slight unsteadiness descending; prefers to go down stairs backwards.  // bars: Resisted walking with two tubes x 10 lengths forward, backward, sidestepping L/R; patient demonstrates mild trunk lean compensation with sidestepping B and forward.   SCIFIT level 7 x 12 min to improve functional capacity of LLE (unbilled)  Cold pack to LLE ankle/lower leg x 10 min to reduce swelling (unbilled)   Patient tolerated therapy well reporting 0/10 pain at end of session.      PT Education - 12/18/17 [phone]     Education provided  Yes    Education Details  Activity limitations (taking it easy at work), ankle strengthening.    Person(s) Educated  Patient    Methods  Explanation    Comprehension  Verbalized understanding          PT Long Term Goals - 11/26/17 1729      PT LONG TERM GOAL #1   Title  Pt. independent with HEP to increase R ankle PF/IV AROM to WNL as compared to L to improve ankle mobility.      Baseline  R/L ankle PF: 45/48 deg.  IV 10/26 deg.      Time  4    Period  Weeks    Status  New    Target Date  12/23/17      PT LONG TERM GOAL #2   Title  Pt. will increase B ankle stability to WNL (PF strength to 4/5 MMT) to improve toe phase of gait/ step ups/downs with no pain.      Baseline  B ankle PF strength grossly 2+/5 MMT.  Pain limited.     Time  4    Period  Weeks    Status  New    Target Date  12/23/17      PT LONG TERM GOAL #3   Title  Pt. will increase FOTO score from 42 to 60 to improve pain-free mobility.      Baseline  FOTO score 42 on 11/25/17    Time  4    Period  Weeks    Status  New    Target Date  12/23/17      PT LONG TERM GOAL #4   Title  Pt. will ambulate community distances with normalized gait pattern and no c/o pain to improve pain-free mobility.      Baseline  R antalgic gait pattern/ no assistive device currently    Time  4    Period  Weeks    Status  New    Target Date  12/23/17            Plan - 12/18/17 0945    Clinical Impression Statement  Able to don pt compression stocking with difficulty; patient advised on proper stocking use and advised to buy a larger size if he cannot don/doff easily. Swelling controlled well through exercise with compression stocking. Patient shows mild L trendelenberg suggesting weakness in hip abductors but near normal gait pattern on even ground and stairs with good ankle mobility. Tolerates balance and ankle strengthening exercises without increase in pain but requires occasional UE assist.    Clinical  Presentation  Stable    Clinical Decision Making  Low    Rehab Potential  Good    PT Frequency  2x / week    PT Duration  4 weeks    PT Treatment/Interventions  ADLs/Self Care Home Management;Electrical Stimulation;Cryotherapy;Therapeutic activities;Functional mobility training;Stair training;Gait training;Therapeutic exercise;Balance training;Neuromuscular re-education;Manual techniques;Scar mobilization;Passive range of motion    PT Next Visit Plan  Increase R ankle ROM/ stability.    CHECK GOALS.  RECERT vs. DISCHARGE.         Patient will benefit from skilled therapeutic intervention in order to improve the following deficits and impairments:  Abnormal gait, Decreased balance, Decreased endurance, Decreased mobility, Difficulty walking, Hypomobility, Obesity, Decreased range of motion, Decreased activity tolerance, Decreased strength, Impaired flexibility  Visit Diagnosis: Stiffness of right ankle joint  Muscle weakness (generalized)  Pain in right ankle and joints of right foot  Difficulty in walking, not elsewhere classified     Problem List Patient Active Problem List   Diagnosis Date Noted  . Tibia/fibula fracture, right, closed, initial encounter 10/16/2017  . Crushing injury of left thumb 09/15/2017  . Mood disorder (HCC) 04/02/2017  . OSA on CPAP 08/13/2016  . Severe obesity (BMI >= 40) (HCC) 08/13/2016  . BPH associated with nocturia 08/13/2016  . Pedal edema 08/13/2016  . Dyslipidemia   . Healthcare maintenance 10/01/2013  . Vitamin B12 deficiency 09/13/2013  . Fatigue 08/16/2013   Cammie Mcgee, PT, DPT # [phone] Janee Morn, SPT 12/18/2017, 10:36 AM  [facility] [address] [place], [place], [address] Phone: [phone]   Fax:  [phone]  Name: Steve Lynch MRN: [id number] Date of Birth: 20-Dec-1955

## 2017-12-22 ENCOUNTER — Encounter: Payer: Self-pay | Admitting: Physical Therapy

## 2017-12-22 ENCOUNTER — Ambulatory Visit: Payer: Worker's Compensation | Admitting: Physical Therapy

## 2017-12-22 ENCOUNTER — Ambulatory Visit: Payer: Self-pay | Admitting: Physical Therapy

## 2017-12-22 DIAGNOSIS — M6281 Muscle weakness (generalized): Secondary | ICD-10-CM

## 2017-12-22 DIAGNOSIS — M25671 Stiffness of right ankle, not elsewhere classified: Secondary | ICD-10-CM

## 2017-12-22 DIAGNOSIS — R262 Difficulty in walking, not elsewhere classified: Secondary | ICD-10-CM

## 2017-12-22 DIAGNOSIS — M25571 Pain in right ankle and joints of right foot: Secondary | ICD-10-CM

## 2017-12-22 NOTE — Therapy (Signed)
[facility] [address] [place], [place], [address] Phone: [phone]   Fax:  [phone]  Physical Therapy Treatment  Patient Details  Name: Steve Lynch MRN: [id number] Date of Birth: 11/15/1955 Referring Provider: Dr. Jan FiremanPilson   Encounter Date: 12/22/2017    Past Medical History:  Diagnosis Date  . Drug abuse in remission 1990s   Cocaine with rehab  . Dyslipidemia    mild  . Peripheral vascular disease (HCC) 2004   varicose veins right leg  . Tendonitis 10/2010   temporal tendonitis with locked jaw following tooth extraction  . Vitamin B12 deficiency 09/2013    Past Surgical History:  Procedure Laterality Date  . BACK SURGERY  2005   "DISC  REPLACEMENT"   L5-6   . COLONOSCOPY  12/2011   TAx1, diverticulosis, rpt 5 yrs (Dr [name] @ [facility])  . KNEE ARTHROPLASTY Left 2012  . TONSILLECTOMY    . TOTAL KNEE ARTHROPLASTY  11/12/2011   Procedure: TOTAL KNEE ARTHROPLASTY;  Surgeon: Shelda PalMatthew D Olin, MD; Laterality: Right  . TOTAL KNEE ARTHROPLASTY Bilateral   . VARICOSE VEIN SURGERY Right    sclerotherapy    There were no vitals filed for this visit.  Subjective Assessment - 12/22/17 0723    Limitations  Standing;Walking;House hold activities;Other (comment)    Patient Stated Goals  Increase R ankle/lower leg strength and promote normalized gait pattern.  Decrease R ankle pain/ swelling.             TREATMENT  Donned compression stocking on RLE with difficulty. Patient advised concerning appropriate care (removing stocking while sleeping/inactive).  Manual therapy: 4-way RLE ankle stretching 3 x 30 sec. Patient demonstrates limitation in eversion but achieves near-normal range in other physiological motions. Pain  over surgical plate with stretch into eversion, resolved with change in therapist hand placement. Supine BLE hamstring stretch B 3 x 30 sec   Therapeutic exercise: 4-way ankle isometrics  5 sec hold x 10  Ambulation 200 ft: patient demonstrates near-normal reciprocal gait with heel strike and good foot clearance; mild L-sided trendelenberg.   // bars: BOSU blue-side up lunges 2 x 10 B with occasional UE assist BOSU black-side up B ankle DF/PF 2 x 10 BOSU black-side up weight shifting with knee bends over BLE x 20  Over 1/2 foam: ankle DF/PF x 10. Patient able to complete without UE assist. Patient complaint of tightness and mild pain in lateral calf.  Manual therapy: LLE gastroc stretch in supine with ER 2 x 30 sec, with IR 2 x 20 sec; patient reports resolution of tightness/pain. LLE SLR x 10 patient reports minor pain in low back, possibly due to hamstring tightness  Ascend/descend 4 steps x 6; patient demonstrates normal reciprocal gait on stairs with slight unsteadiness descending; prefers to go down stairs backwards.  // bars: Resisted walking with two tubes x 10 lengths forward, backward, sidestepping L/R; patient demonstrates mild trunk lean compensation with sidestepping B and forward.   SCIFIT level 7 x 12 min to improve functional capacity of LLE (unbilled)  Cold pack to LLE ankle/lower leg x 10 min to reduce swelling (unbilled) Patient tolerated therapy well reporting 0/10 pain at end of session.    PT Long Term Goals - 11/26/17 1729      PT LONG TERM GOAL #1   Title  Pt. independent with HEP to increase R ankle PF/IV AROM to WNL as compared to L to improve ankle mobility.  Baseline  R/L ankle PF: 45/48 deg.  IV 10/26 deg.      Time  4    Period  Weeks    Status  New    Target Date  12/23/17      PT LONG TERM GOAL #2   Title  Pt. will increase B ankle stability to WNL (PF strength to 4/5 MMT) to improve toe phase of gait/ step ups/downs with no pain.      Baseline  B ankle PF strength grossly 2+/5 MMT.  Pain limited.     Time  4    Period  Weeks    Status  New    Target Date  12/23/17      PT LONG TERM GOAL #3   Title  Pt. will  increase FOTO score from 42 to 60 to improve pain-free mobility.      Baseline  FOTO score 42 on 11/25/17    Time  4    Period  Weeks    Status  New    Target Date  12/23/17      PT LONG TERM GOAL #4   Title  Pt. will ambulate community distances with normalized gait pattern and no c/o pain to improve pain-free mobility.      Baseline  R antalgic gait pattern/ no assistive device currently    Time  4    Period  Weeks    Status  New    Target Date  12/23/17            Plan - 12/22/17 0723    Clinical Presentation  Stable    Clinical Decision Making  Low    Rehab Potential  Good    PT Frequency  2x / week    PT Duration  4 weeks    PT Treatment/Interventions  ADLs/Self Care Home Management;Electrical Stimulation;Cryotherapy;Therapeutic activities;Functional mobility training;Stair training;Gait training;Therapeutic exercise;Balance training;Neuromuscular re-education;Manual techniques;Scar mobilization;Passive range of motion    PT Next Visit Plan  Increase R ankle ROM/ stability.           Patient will benefit from skilled therapeutic intervention in order to improve the following deficits and impairments:  Abnormal gait, Decreased balance, Decreased endurance, Decreased mobility, Difficulty walking, Hypomobility, Obesity, Decreased range of motion, Decreased activity tolerance, Decreased strength, Impaired flexibility  Visit Diagnosis: Stiffness of right ankle joint  Muscle weakness (generalized)  Pain in right ankle and joints of right foot  Difficulty in walking, not elsewhere classified     Problem List Patient Active Problem List   Diagnosis Date Noted  . Tibia/fibula fracture, right, closed, initial encounter 10/16/2017  . Crushing injury of left thumb 09/15/2017  . Mood disorder (HCC) 04/02/2017  . OSA on CPAP 08/13/2016  . Severe obesity (BMI >= 40) (HCC) 08/13/2016  . BPH associated with nocturia 08/13/2016  . Pedal edema 08/13/2016  . Dyslipidemia   .  Healthcare maintenance 10/01/2013  . Vitamin B12 deficiency 09/13/2013  . Fatigue 08/16/2013    Cammie Mcgee 12/22/2017, 7:24 AM  [facility] [address] [place], [place], [address] Phone: [phone]   Fax:  [phone]  Name: Steve Lynch MRN: [id number] Date of Birth: April 03, 1956

## 2017-12-22 NOTE — Therapy (Signed)
[place] [facility] [address] [place], [place], [address] Phone: [phone]   Fax:  [phone]  Physical Therapy Treatment  Patient Details  Name: Steve Lynch MRN: [id number] Date of Birth: 1956/04/11 Referring Provider: Dr. San Jetty   Encounter Date: 12/22/2017  PT End of Session - 12/22/17 1221    Visit Number  9    Number of Visits  17    Date for PT Re-Evaluation  01/19/18    PT Start Time  2703    PT Stop Time  1104    PT Time Calculation (min)  50 min    Activity Tolerance  Patient tolerated treatment well    Behavior During Therapy  [facility] for tasks assessed/performed       Past Medical History:  Diagnosis Date  . Drug abuse in remission 1990s   Cocaine with rehab  . Dyslipidemia    mild  . Peripheral vascular disease ([place]) 2004   varicose veins right leg  . Tendonitis 10/2010   temporal tendonitis with locked jaw following tooth extraction  . Vitamin B12 deficiency 09/2013    Past Surgical History:  Procedure Laterality Date  . BACK SURGERY  2005   "DISC  REPLACEMENT"   L5-6   . COLONOSCOPY  12/2011   TAx1, diverticulosis, rpt 5 yrs (Dr Bryn Gulling @ [facility])  . KNEE ARTHROPLASTY Left 2012  . TONSILLECTOMY    . TOTAL KNEE ARTHROPLASTY  11/12/2011   Procedure: TOTAL KNEE ARTHROPLASTY;  Surgeon: Mauri Pole, MD; Laterality: Right  . TOTAL KNEE ARTHROPLASTY Bilateral   . VARICOSE VEIN SURGERY Right    sclerotherapy    There were no vitals filed for this visit.  Subjective Assessment - 12/22/17 1037    Subjective  No pain reported, just stiffness in R foot/ankle.  Pt. has been donning/doffing compression stockings over past several days.  Pt. reports minimal overall improvement in R lower leg/ankle swelling.      Limitations  Standing;Walking;House hold activities;Other (comment)    Patient Stated Goals  Increase R ankle/lower leg strength and promote normalized gait pattern.  Decrease R ankle pain/  swelling.      Currently in Pain?  No/denies         [facility] PT Assessment - 12/22/17 0001      Assessment   Medical Diagnosis  s/p tibial plateau/fibula fracture.  Gait difficulty     Referring Provider  Dr. San Jetty    Onset Date/Surgical Date  09/25/17       FOTO: 45    TREATMENT  SCIFIT level 7 x 12 min to improve functional capacity of LLE (unbilled)  Manual therapy: 4-way RLE ankle stretching 3 x 30 sec. Pain over surgical plate with stretch into eversion, STM from distal to proximal R lower leg. Supine BLE hamstring/gastroc stretches B 3 x 30 sec   Therapeutic exercise: 4-way ankle isometrics 5 sec hold x 10 Ambulation 450 ft: patient demonstrates near-normal reciprocal gait with heel strike and good foot clearance; increase R antalgic gait pattern with increase distance walked. // bars: BOSU step ups forward/ lateral 15x each. BOSU black-side up B ankle DF/PF 2 x 10 BOSU black-side up weight shifting with knee bends over BLE x 20  Standing wall squats with yellow ball 10x2 (fatigue/ increase B knee discomfort) Ascend/descend 4 steps x 6; patient demonstrates normal reciprocal gait on stairs with slight unsteadiness descending; prefers to go down stairs backwards.  // bars: Resisted walking with two  tubes x 10 lengths forward, backward, sidestepping L/R; patient demonstrates mild trunk lean compensation with sidestepping B and forward.    No Ice today.      PT Long Term Goals - 12/22/17 1038      PT LONG TERM GOAL #1   Title  Pt. independent with HEP to increase R ankle PF/IV AROM to WNL as compared to L to improve ankle mobility.      Baseline  R/L ankle PF: 56 (increase noted)/60 deg.,  IV 24 (marked increase)/28 deg.      Time  4    Period  Weeks    Status  Achieved    Target Date  12/22/17      PT LONG TERM GOAL #2   Title  Pt. will increase B ankle stability to WNL (PF strength to 4/5 MMT) to improve toe phase of gait/ step ups/downs with no  pain.      Baseline  B ankle PF strength grossly 3+/5 MMT.  Pain limited with standing heel raises    Time  4    Period  Weeks    Status  Partially Met    Target Date  01/19/18      PT LONG TERM GOAL #3   Title  Pt. will increase FOTO score from 42 to 60 to improve pain-free mobility.      Baseline  FOTO score 42 on 11/25/17.  FOTO: 45 on 3/11    Time  4    Period  Weeks    Status  Not Met    Target Date  01/19/18      PT LONG TERM GOAL #4   Title  Pt. will ambulate community distances with normalized gait pattern and no c/o pain to improve pain-free mobility.      Baseline  Slight R antalgic gait pattern, esp. with increase distance walking.  Decrease overall stance time on R.  Improved heel strike/ toe off.      Time  4    Period  Weeks    Status  Partially Met    Target Date  01/19/18            Plan - 12/22/17 1223    Clinical Impression Statement  Pt. has continued to show slow but consistent progress with R ankle AROM/ stability.  R/L ankle PF: 56 (increase noted)/60 deg., IV 24 (marked increase)/28 deg.   Pt. ambulates with slight R antaligic gait pattern with increase distance walked with no assistive device.  Pt. presents with R lower leg/ankle swelling with increase standing/walking tasks.  (+) R distal lower leg tenderness with light palpation.  Pt. will continue to benefit from skilled PT services to increase R ankle stability/ pain-free mobility.      Clinical Presentation  Stable    Clinical Decision Making  Low    Rehab Potential  Good    PT Frequency  2x / week    PT Duration  4 weeks    PT Treatment/Interventions  ADLs/Self Care Home Management;Electrical Stimulation;Cryotherapy;Therapeutic activities;Functional mobility training;Stair training;Gait training;Therapeutic exercise;Balance training;Neuromuscular re-education;Manual techniques;Scar mobilization;Passive range of motion    PT Next Visit Plan  Increase R ankle ROM/ stability.           Patient will  benefit from skilled therapeutic intervention in order to improve the following deficits and impairments:  Abnormal gait, Decreased balance, Decreased endurance, Decreased mobility, Difficulty walking, Hypomobility, Obesity, Decreased range of motion, Decreased activity tolerance, Decreased strength, Impaired flexibility  Visit  Diagnosis: Stiffness of right ankle joint  Muscle weakness (generalized)  Pain in right ankle and joints of right foot  Difficulty in walking, not elsewhere classified     Problem List Patient Active Problem List   Diagnosis Date Noted  . Tibia/fibula fracture, right, closed, initial encounter 10/16/2017  . Crushing injury of left thumb 09/15/2017  . Mood disorder ([place]) 04/02/2017  . OSA on CPAP 08/13/2016  . Severe obesity (BMI >= 40) ([place]) 08/13/2016  . BPH associated with nocturia 08/13/2016  . Pedal edema 08/13/2016  . Dyslipidemia   . Healthcare maintenance 10/01/2013  . Vitamin B12 deficiency 09/13/2013  . Fatigue 08/16/2013   Pura Spice, PT, DPT # [phone] 12/22/2017, 12:29 PM  [facility] [facility] [address] [place], [place], [address] Phone: [phone]   Fax:  [phone]  Name: Jamiel Goncalves Mervin MRN: [id number] Date of Birth: 1955/12/07

## 2017-12-23 ENCOUNTER — Ambulatory Visit: Payer: Worker's Compensation | Admitting: Physical Therapy

## 2017-12-23 DIAGNOSIS — R262 Difficulty in walking, not elsewhere classified: Secondary | ICD-10-CM

## 2017-12-23 DIAGNOSIS — M6281 Muscle weakness (generalized): Secondary | ICD-10-CM

## 2017-12-23 DIAGNOSIS — M25671 Stiffness of right ankle, not elsewhere classified: Secondary | ICD-10-CM | POA: Diagnosis not present

## 2017-12-23 DIAGNOSIS — M25571 Pain in right ankle and joints of right foot: Secondary | ICD-10-CM

## 2017-12-27 NOTE — Therapy (Signed)
[facility] [address] [place], [place], [address] Phone: [phone]   Fax:  [phone]  Physical Therapy Treatment  Patient Details  Name: [name] MRN: [id number] Date of Birth: 12-13-1955 Referring Provider: Dr. San Jetty   Encounter Date: 12/23/2017  PT End of Session - 12/27/17 0838    Visit Number  10    Number of Visits  17    Date for PT Re-Evaluation  01/19/18    PT Start Time  0808    PT Stop Time  0903    PT Time Calculation (min)  55 min    Activity Tolerance  Patient tolerated treatment well    Behavior During Therapy  [facility] for tasks assessed/performed       Past Medical History:  Diagnosis Date  . Drug abuse in remission 1990s   Cocaine with rehab  . Dyslipidemia    mild  . Peripheral vascular disease ([place]) 2004   varicose veins right leg  . Tendonitis 10/2010   temporal tendonitis with locked jaw following tooth extraction  . Vitamin B12 deficiency 09/2013    Past Surgical History:  Procedure Laterality Date  . BACK SURGERY  2005   "DISC  REPLACEMENT"   L5-6   . COLONOSCOPY  12/2011   TAx1, diverticulosis, rpt 5 yrs (Dr Bryn Gulling @ [facility])  . KNEE ARTHROPLASTY Left 2012  . TONSILLECTOMY    . TOTAL KNEE ARTHROPLASTY  11/12/2011   Procedure: TOTAL KNEE ARTHROPLASTY;  Surgeon: Mauri Pole, MD; Laterality: Right  . TOTAL KNEE ARTHROPLASTY Bilateral   . VARICOSE VEIN SURGERY Right    sclerotherapy    There were no vitals filed for this visit.  Subjective Assessment - 12/27/17 0836    Subjective  Pt. reports no increase c/o pain since PT appt. yesterday.  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      Limitations  Standing;Walking;House hold activities;Other (comment)    Patient Stated Goals  Increase R ankle/lower leg strength and promote normalized gait pattern.  Decrease R ankle pain/ swelling.      Currently in Pain?  No/denies         SCIFIT level 7 x 12 min to improve functional capacity of LLE (warm-up/no charge)  Manual therapy:  4-way RLE ankle stretching 3 x 30 sec. Pain over surgical plate with stretch into eversion, STM from distal to proximal R lower leg. Supine BLE hamstring/gastroc stretches B 3 x 30 sec   Therapeutic exercise:  Ambulation in clinic: patient demonstrates near-normal reciprocal gait with heel strike and good foot clearance; increase R antalgic gait pattern with increase distance walked. // bars: BOSU step ups forward/ lateral 15x each. BOSU black-side up B ankle DF/PF 2 x 10 BOSU black-side up weight shifting with knee bends over BLE x 20 Airex tandem stance/ step ups/ overs Standing 1/2 bolster with min. To no UE assist in //-bars Ascend/descend 4 steps x 6; patient demonstrates normal reciprocal gait on stairs with slight unsteadiness descending; prefers to go down stairs backwards. // bars: Resisted walking 2BTB 5x all 4-planes Walking lunges with short holds 3x up/back in //-bars.  Mirror feedback   No Ice today.    PT Long Term Goals - 12/22/17 1038      PT LONG TERM GOAL #1   Title  Pt. independent with HEP to increase R ankle PF/IV AROM to WNL as compared to L to  improve ankle mobility.      Baseline  R/L ankle PF: 56 (increase noted)/60 deg.,  IV 24 (marked increase)/28 deg.      Time  4    Period  Weeks    Status  Achieved    Target Date  12/22/17      PT LONG TERM GOAL #2   Title  Pt. will increase B ankle stability to WNL (PF strength to 4/5 MMT) to improve toe phase of gait/ step ups/downs with no pain.      Baseline  B ankle PF strength grossly 3+/5 MMT.  Pain limited with standing heel raises    Time  4    Period  Weeks    Status  Partially Met    Target Date  01/19/18      PT LONG TERM GOAL #3   Title  Pt. will increase FOTO score from 42 to 60 to improve pain-free mobility.      Baseline  FOTO score 42 on 11/25/17.  FOTO: 45 on 3/11    Time  4     Period  Weeks    Status  Not Met    Target Date  01/19/18      PT LONG TERM GOAL #4   Title  Pt. will ambulate community distances with normalized gait pattern and no c/o pain to improve pain-free mobility.      Baseline  Slight R antalgic gait pattern, esp. with increase distance walking.  Decrease overall stance time on R.  Improved heel strike/ toe off.      Time  4    Period  Weeks    Status  Partially Met    Target Date  01/19/18            Plan - 12/27/17 0839    Clinical Impression Statement  Pt. doing well with higher level ankle stability tasks in gym (1/2 bolster/ walking lunges) with less discomfort.  R lower leg/ ankle swelling remains present with any standing/ walking/ ther.ex.  Kermit Balo tx. endurance with no increase c/o pain.      Clinical Presentation  Stable    Clinical Decision Making  Low    Rehab Potential  Good    PT Frequency  2x / week    PT Duration  4 weeks    PT Treatment/Interventions  ADLs/Self Care Home Management;Electrical Stimulation;Cryotherapy;Therapeutic activities;Functional mobility training;Stair training;Gait training;Therapeutic exercise;Balance training;Neuromuscular re-education;Manual techniques;Scar mobilization;Passive range of motion    PT Next Visit Plan  Increase R ankle ROM/ stability.           Patient will benefit from skilled therapeutic intervention in order to improve the following deficits and impairments:  Abnormal gait, Decreased balance, Decreased endurance, Decreased mobility, Difficulty walking, Hypomobility, Obesity, Decreased range of motion, Decreased activity tolerance, Decreased strength, Impaired flexibility  Visit Diagnosis: Stiffness of right ankle joint  Muscle weakness (generalized)  Pain in right ankle and joints of right foot  Difficulty in walking, not elsewhere classified     Problem List Patient Active Problem List   Diagnosis Date Noted  . Tibia/fibula fracture, right, closed, initial encounter  10/16/2017  . Crushing injury of left thumb 09/15/2017  . Mood disorder ([place]) 04/02/2017  . OSA on CPAP 08/13/2016  . Severe obesity (BMI >= 40) ([place]) 08/13/2016  . BPH associated with nocturia 08/13/2016  . Pedal edema 08/13/2016  . Dyslipidemia   . Healthcare maintenance 10/01/2013  . Vitamin B12 deficiency 09/13/2013  . Fatigue 08/16/2013  Pura Spice, PT, DPT # [id number] 12/27/2017, 8:42 AM  [facility] [facility] [address] [place], [place], [address] Phone: [phone]   Fax:  [phone]  Name: Amaree Leeper Jaspers MRN: [id number] Date of Birth: 02-08-1956

## 2017-12-30 ENCOUNTER — Ambulatory Visit: Payer: Worker's Compensation | Admitting: Physical Therapy

## 2017-12-30 ENCOUNTER — Encounter: Payer: Self-pay | Admitting: Physical Therapy

## 2017-12-30 DIAGNOSIS — M6281 Muscle weakness (generalized): Secondary | ICD-10-CM

## 2017-12-30 DIAGNOSIS — M25671 Stiffness of right ankle, not elsewhere classified: Secondary | ICD-10-CM | POA: Diagnosis not present

## 2017-12-30 DIAGNOSIS — R262 Difficulty in walking, not elsewhere classified: Secondary | ICD-10-CM

## 2017-12-30 DIAGNOSIS — M25571 Pain in right ankle and joints of right foot: Secondary | ICD-10-CM

## 2017-12-30 NOTE — Therapy (Signed)
[facility] [address]. [place], [place], [address] Phone: [phone]   Fax:  [phone]  Physical Therapy Treatment  Patient Details  Name: Steve Lynch MRN: [id number] Date of Birth: 1956-01-12 Referring Provider: Dr. San Jetty   Encounter Date: 12/30/2017    Treatment 11 of 17.  Recert date: 10/16/22   Past Medical History:  Diagnosis Date  . Drug abuse in remission 1990s   Cocaine with rehab  . Dyslipidemia    mild  . Peripheral vascular disease ([place]) 2004   varicose veins right leg  . Tendonitis 10/2010   temporal tendonitis with locked jaw following tooth extraction  . Vitamin B12 deficiency 09/2013    Past Surgical History:  Procedure Laterality Date  . BACK SURGERY  2005   "DISC  REPLACEMENT"   L5-6   . COLONOSCOPY  12/2011   TAx1, diverticulosis, rpt 5 yrs (Dr Bryn Gulling @ [facility])  . KNEE ARTHROPLASTY Left 2012  . TONSILLECTOMY    . TOTAL KNEE ARTHROPLASTY  11/12/2011   Procedure: TOTAL KNEE ARTHROPLASTY;  Surgeon: Mauri Pole, MD; Laterality: Right  . TOTAL KNEE ARTHROPLASTY Bilateral   . VARICOSE VEIN SURGERY Right    sclerotherapy    There were no vitals filed for this visit.    No pain reported at this time. 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        Treatment  There.ex:  Scifit L7 10 min. B LE. (no charge/ warm-up).  Standing heel/toe raises 20x. Tandem gait/ lateral walking with increase knee flexion Resisted gait with 2BTB 10x all 4-planes.     12" R step ups with no UE assist 10x2.   120# sled push and pull 45' in hallway x 4.   BOSU lunges/ step ups with light to no UE assist.   TG knee flexion/ heel raises/ gastroc stretches.  Reviewed HEP/ eccentric heel raises.        Improved heel raises/ eccentric muscle control noted during //-bars standing ther.ex. Pt. continues to require cuing to correct/ normalize gait pattern to prevent  decrease stance on R LE.       PT Long Term Goals - 12/22/17 1038      PT LONG TERM GOAL #1   Title  Pt. independent with HEP to increase R ankle PF/IV AROM to WNL as compared to L to improve ankle mobility.      Baseline  R/L ankle PF: 56 (increase noted)/60 deg.,  IV 24 (marked increase)/28 deg.      Time  4    Period  Weeks    Status  Achieved    Target Date  12/22/17      PT LONG TERM GOAL #2   Title  Pt. will increase B ankle stability to WNL (PF strength to 4/5 MMT) to improve toe phase of gait/ step ups/downs with no pain.      Baseline  B ankle PF strength grossly 3+/5 MMT.  Pain limited with standing heel raises    Time  4    Period  Weeks    Status  Partially Met    Target Date  01/19/18      PT LONG TERM GOAL #3   Title  Pt. will increase FOTO score from 42 to 60 to improve pain-free mobility.      Baseline  FOTO score 42 on 11/25/17.  FOTO: 45 on 3/11    Time  4    Period  Weeks    Status  Not Met    Target Date  01/19/18      PT LONG TERM GOAL #4   Title  Pt. will ambulate community distances with normalized gait pattern and no c/o pain to improve pain-free mobility.      Baseline  Slight R antalgic gait pattern, esp. with increase distance walking.  Decrease overall stance time on R.  Improved heel strike/ toe off.      Time  4    Period  Weeks    Status  Partially Met    Target Date  01/19/18              Patient will benefit from skilled therapeutic intervention in order to improve the following deficits and impairments:  Abnormal gait, Decreased balance, Decreased endurance, Decreased mobility, Difficulty walking, Hypomobility, Obesity, Decreased range of motion, Decreased activity tolerance, Decreased strength, Impaired flexibility  Visit Diagnosis: Stiffness of right ankle joint  Muscle weakness (generalized)  Pain in right ankle and joints of right foot  Difficulty in walking, not elsewhere classified     Problem List Patient Active  Problem List   Diagnosis Date Noted  . Tibia/fibula fracture, right, closed, initial encounter 10/16/2017  . Crushing injury of left thumb 09/15/2017  . Mood disorder ([place]) 04/02/2017  . OSA on CPAP 08/13/2016  . Severe obesity (BMI >= 40) ([place]) 08/13/2016  . BPH associated with nocturia 08/13/2016  . Pedal edema 08/13/2016  . Dyslipidemia   . Healthcare maintenance 10/01/2013  . Vitamin B12 deficiency 09/13/2013  . Fatigue 08/16/2013   Pura Spice, PT, DPT # [id number] 01/04/2018, 6:16 PM  [facility] [place] [facility] [address] [place], [place], [address] Phone: [phone]   Fax:  [phone]  Name: Steve Lynch MRN: [id number] Date of Birth: 1955-10-18

## 2018-01-01 ENCOUNTER — Ambulatory Visit: Payer: Worker's Compensation | Admitting: Physical Therapy

## 2018-01-01 ENCOUNTER — Encounter: Payer: Self-pay | Admitting: Physical Therapy

## 2018-01-01 DIAGNOSIS — M6281 Muscle weakness (generalized): Secondary | ICD-10-CM

## 2018-01-01 DIAGNOSIS — M25671 Stiffness of right ankle, not elsewhere classified: Secondary | ICD-10-CM | POA: Diagnosis not present

## 2018-01-01 DIAGNOSIS — M25571 Pain in right ankle and joints of right foot: Secondary | ICD-10-CM

## 2018-01-01 DIAGNOSIS — R262 Difficulty in walking, not elsewhere classified: Secondary | ICD-10-CM

## 2018-01-01 NOTE — Therapy (Signed)
[facility] [address]. [place], [place], [address] Phone: [phone]   Fax:  [phone]  Physical Therapy Treatment  Patient Details  Name: Steve Lynch MRN: [id number] Date of Birth: Aug 05, 1956 Referring Provider: Dr. San Jetty   Encounter Date: 01/01/2018    Treatment 12 of 17.  Recert: 02/14/60   Past Medical History:  Diagnosis Date  . Drug abuse in remission 1990s   Cocaine with rehab  . Dyslipidemia    mild  . Peripheral vascular disease ([employer]) 2004   varicose veins right leg  . Tendonitis 10/2010   temporal tendonitis with locked jaw following tooth extraction  . Vitamin B12 deficiency 09/2013    Past Surgical History:  Procedure Laterality Date  . BACK SURGERY  2005   "DISC  REPLACEMENT"   L5-6   . COLONOSCOPY  12/2011   TAx1, diverticulosis, rpt 5 yrs (Dr Bryn Gulling @ [facility])  . KNEE ARTHROPLASTY Left 2012  . TONSILLECTOMY    . TOTAL KNEE ARTHROPLASTY  11/12/2011   Procedure: TOTAL KNEE ARTHROPLASTY;  Surgeon: Mauri Pole, MD; Laterality: Right  . TOTAL KNEE ARTHROPLASTY Bilateral   . VARICOSE VEIN SURGERY Right    sclerotherapy    There were no vitals filed for this visit.     Pt. able to climb stairs while carrying a plastic tote with no issues yesterday. No c/o R ankle/lower leg pain.      SCIFIT level 8 x 10 min. to improve functional capacity of LLE (warm-up/no charge)   Therapeutic exercise:  Star ex.: cone touches on L/R (varying distance)- no UE assist. Ladder climbing 3 rungs. Sled push/pull 100# in hallway (45 feet x 3).   Nautilus: 200# forward/backwards and 155# lateral 5x all 4-planes resisted walking. TG knee flexion/ heel raises/ gastroc stretches 20x each/ single leg squat 10x L/R.   Gastroc stretches at 1st step of stairs 3x 20 sec. Holds.    BOSUstep ups forward/ lateral 15x each.    No Ice today.       Moderate R lower leg/ ankle swelling noted t/o  tx. session. Less tenderness with palpation to R lower leg during manual stretches. Pt. able to complete 25 bilateral standing heel/toe raises with light UE assist for balance in //-bars. No c/o pain with 12" step ups/downs.    PT Long Term Goals - 12/22/17 1038      PT LONG TERM GOAL #1   Title  Pt. independent with HEP to increase R ankle PF/IV AROM to WNL as compared to L to improve ankle mobility.      Baseline  R/L ankle PF: 56 (increase noted)/60 deg.,  IV 24 (marked increase)/28 deg.      Time  4    Period  Weeks    Status  Achieved    Target Date  12/22/17      PT LONG TERM GOAL #2   Title  Pt. will increase B ankle stability to WNL (PF strength to 4/5 MMT) to improve toe phase of gait/ step ups/downs with no pain.      Baseline  B ankle PF strength grossly 3+/5 MMT.  Pain limited with standing heel raises    Time  4    Period  Weeks    Status  Partially Met    Target Date  01/19/18      PT LONG TERM GOAL #3   Title  Pt. will increase FOTO score from 42 to 60  to improve pain-free mobility.      Baseline  FOTO score 42 on 11/25/17.  FOTO: 45 on 3/11    Time  4    Period  Weeks    Status  Not Met    Target Date  01/19/18      PT LONG TERM GOAL #4   Title  Pt. will ambulate community distances with normalized gait pattern and no c/o pain to improve pain-free mobility.      Baseline  Slight R antalgic gait pattern, esp. with increase distance walking.  Decrease overall stance time on R.  Improved heel strike/ toe off.      Time  4    Period  Weeks    Status  Partially Met    Target Date  01/19/18          Patient will benefit from skilled therapeutic intervention in order to improve the following deficits and impairments:  Abnormal gait, Decreased balance, Decreased endurance, Decreased mobility, Difficulty walking, Hypomobility, Obesity, Decreased range of motion, Decreased activity tolerance, Decreased strength, Impaired flexibility  Visit Diagnosis: Stiffness of  right ankle joint  Muscle weakness (generalized)  Pain in right ankle and joints of right foot  Difficulty in walking, not elsewhere classified     Problem List Patient Active Problem List   Diagnosis Date Noted  . Tibia/fibula fracture, right, closed, initial encounter 10/16/2017  . Crushing injury of left thumb 09/15/2017  . Mood disorder ([place]) 04/02/2017  . OSA on CPAP 08/13/2016  . Severe obesity (BMI >= 40) ([place]) 08/13/2016  . BPH associated with nocturia 08/13/2016  . Pedal edema 08/13/2016  . Dyslipidemia   . Healthcare maintenance 10/01/2013  . Vitamin B12 deficiency 09/13/2013  . Fatigue 08/16/2013   Pura Spice, PT, DPT # [id number] 01/08/2018, 9:13 AM  [place] [facility] [address] [place], [place], [address] Phone: [phone]   Fax:  [phone]  Name: Steve Lynch MRN: [id number] Date of Birth: 03-26-1956

## 2018-01-06 ENCOUNTER — Ambulatory Visit: Payer: Worker's Compensation | Admitting: Physical Therapy

## 2018-01-06 DIAGNOSIS — M25571 Pain in right ankle and joints of right foot: Secondary | ICD-10-CM

## 2018-01-06 DIAGNOSIS — M25671 Stiffness of right ankle, not elsewhere classified: Secondary | ICD-10-CM | POA: Diagnosis not present

## 2018-01-06 DIAGNOSIS — R262 Difficulty in walking, not elsewhere classified: Secondary | ICD-10-CM

## 2018-01-06 DIAGNOSIS — M6281 Muscle weakness (generalized): Secondary | ICD-10-CM

## 2018-01-08 ENCOUNTER — Ambulatory Visit: Payer: Worker's Compensation | Admitting: Physical Therapy

## 2018-01-08 ENCOUNTER — Encounter: Payer: Self-pay | Admitting: Physical Therapy

## 2018-01-08 DIAGNOSIS — R262 Difficulty in walking, not elsewhere classified: Secondary | ICD-10-CM

## 2018-01-08 DIAGNOSIS — M25571 Pain in right ankle and joints of right foot: Secondary | ICD-10-CM

## 2018-01-08 DIAGNOSIS — M6281 Muscle weakness (generalized): Secondary | ICD-10-CM

## 2018-01-08 DIAGNOSIS — M25671 Stiffness of right ankle, not elsewhere classified: Secondary | ICD-10-CM

## 2018-01-08 NOTE — Therapy (Signed)
[facility] [address]. [place], [place], [address] Phone: [phone]   Fax:  [phone]  Physical Therapy Treatment  Patient Details  Name: Steve Lynch [place] MRN: [id number] Date of Birth: 1956/06/19 Referring Provider: Dr. San Jetty   Encounter Date: 01/06/2018  PT End of Session - 01/08/18 0918    Visit Number  13    Number of Visits  17    Date for PT Re-Evaluation  01/19/18    PT Start Time  0816    PT Stop Time  0909    PT Time Calculation (min)  53 min    Activity Tolerance  Patient tolerated treatment well    Behavior During Therapy  [facility] for tasks assessed/performed       Past Medical History:  Diagnosis Date  . Drug abuse in remission 1990s   Cocaine with rehab  . Dyslipidemia    mild  . Peripheral vascular disease ([place]) 2004   varicose veins right leg  . Tendonitis 10/2010   temporal tendonitis with locked jaw following tooth extraction  . Vitamin B12 deficiency 09/2013    Past Surgical History:  Procedure Laterality Date  . BACK SURGERY  2005   "DISC  REPLACEMENT"   L5-6   . COLONOSCOPY  12/2011   TAx1, diverticulosis, rpt 5 yrs (Dr Bryn Gulling @ [facility])  . KNEE ARTHROPLASTY Left 2012  . TONSILLECTOMY    . TOTAL KNEE ARTHROPLASTY  11/12/2011   Procedure: TOTAL KNEE ARTHROPLASTY;  Surgeon: Mauri Pole, MD; Laterality: Right  . TOTAL KNEE ARTHROPLASTY Bilateral   . VARICOSE VEIN SURGERY Right    sclerotherapy    There were no vitals filed for this visit.  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      Limitations  Standing;Walking;House hold activities;Other (comment)    Patient Stated Goals  Increase R ankle/lower leg strength and promote normalized gait pattern.  Decrease R ankle pain/ swelling.      Currently in Pain?  No/denies          Treatment:  There.ex.:  Reassessment of R ankle AROM (all planes). Scifit L7 10 min. B LE (warm-up/no charge). 8" Airex step ups/ downs (forward/ lateral). Sled push/pull in hallway 45 feet x 4 (120#).  TM walking with focus on consistent heel strike/ increase L hip flexion (2.1 mph with 0%-5%-10%).   12" step ups/downs with no UE assist 10x safely.   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 Resisted walking 155# forward/ backwards 5x.  125# lateral L/R 5x each.   TG knee flexion 20x/ heel raises 20x/ gastroc stretches 10x.   Discussed HEP.      PT Long Term Goals - 12/22/17 1038      PT LONG TERM GOAL #1   Title  Pt. independent with HEP to increase R ankle PF/IV AROM to WNL as compared to L to improve ankle mobility.      Baseline  R/L ankle PF: 56 (increase noted)/60 deg.,  IV 24 (marked increase)/28 deg.      Time  4    Period  Weeks    Status  Achieved    Target Date  12/22/17      PT LONG TERM GOAL #2   Title  Pt. will increase B ankle stability to WNL (PF strength to 4/5 MMT) to  improve toe phase of gait/ step ups/downs with no pain.      Baseline  B ankle PF strength grossly 3+/5 MMT.  Pain limited with standing heel raises    Time  4    Period  Weeks    Status  Partially Met    Target Date  01/19/18      PT LONG TERM GOAL #3   Title  Pt. will increase FOTO score from 42 to 60 to improve pain-free mobility.      Baseline  FOTO score 42 on 11/25/17.  FOTO: 45 on 3/11    Time  4    Period  Weeks    Status  Not Met    Target Date  01/19/18      PT LONG TERM GOAL #4   Title  Pt. will ambulate community distances with normalized gait pattern and no c/o pain to improve pain-free mobility.      Baseline  Slight R antalgic gait pattern, esp. with increase distance walking.  Decrease overall stance time on R.  Improved heel strike/ toe off.      Time  4    Period  Weeks    Status  Partially Met    Target Date  01/19/18            Plan - 01/08/18 0919    Clinical  Impression Statement  No issues with 12" step ups or downs on R LE with good DF noted.  Pt. has progressed well with R quad/LE strengthening ex. program.  Improving ankle control with IV/EV on varying surfaces/ Airex.  Consistent step pattern/ heel strike while walking on TM with varying incline and no c/o pain.  Moderate R lower leg/ ankle swelling remains but not worsening as tx. progresses.      Clinical Presentation  Stable    Clinical Decision Making  Low    Rehab Potential  Good    PT Frequency  2x / week    PT Duration  4 weeks    PT Treatment/Interventions  ADLs/Self Care Home Management;Electrical Stimulation;Cryotherapy;Therapeutic activities;Functional mobility training;Stair training;Gait training;Therapeutic exercise;Balance training;Neuromuscular re-education;Manual techniques;Scar mobilization;Passive range of motion    PT Next Visit Plan  Increase R ankle ROM/ stability.           Patient will benefit from skilled therapeutic intervention in order to improve the following deficits and impairments:  Abnormal gait, Decreased balance, Decreased endurance, Decreased mobility, Difficulty walking, Hypomobility, Obesity, Decreased range of motion, Decreased activity tolerance, Decreased strength, Impaired flexibility  Visit Diagnosis: Stiffness of right ankle joint  Muscle weakness (generalized)  Pain in right ankle and joints of right foot  Difficulty in walking, not elsewhere classified     Problem List Patient Active Problem List   Diagnosis Date Noted  . Tibia/fibula fracture, right, closed, initial encounter 10/16/2017  . Crushing injury of left thumb 09/15/2017  . Mood disorder ([place]) 04/02/2017  . OSA on CPAP 08/13/2016  . Severe obesity (BMI >= 40) ([place]) 08/13/2016  . BPH associated with nocturia 08/13/2016  . Pedal edema 08/13/2016  . Dyslipidemia   . Healthcare maintenance 10/01/2013  . Vitamin B12 deficiency 09/13/2013  . Fatigue 08/16/2013   Pura Spice, PT, DPT # [phone] 01/08/2018, 9:24 AM  [place] [facility] [address] [place], [place], [address] Phone: [phone]   Fax:  [phone]  Name: Zayveon Raschke Henriquez MRN: [id number] Date of Birth: 03/13/1956

## 2018-01-08 NOTE — Therapy (Addendum)
[place] [facility] [address] [place], [place], [address] Phone: [phone]   Fax:  [phone]  Physical Therapy Treatment  Patient Details  Name: Steve Lynch MRN: [id number] Date of Birth: 02/26/1956 Referring Provider: Dr. Jan Fireman   Encounter Date: 01/08/2018    PT End of Session - 01/08/18 1459    Visit Number  14    Number of Visits  17    Date for PT Re-Evaluation  01/19/18    PT Start Time  0818    PT Stop Time  0911    PT Time Calculation (min)  53 min    Activity Tolerance  Patient tolerated treatment well    Behavior During Therapy  [facility] for tasks assessed/performed        Past Medical History:  Diagnosis Date  . Drug abuse in remission 1990s   Cocaine with rehab  . Dyslipidemia    mild  . Peripheral vascular disease ([facility]) 2004   varicose veins right leg  . Tendonitis 10/2010   temporal tendonitis with locked jaw following tooth extraction  . Vitamin B12 deficiency 09/2013    Past Surgical History:  Procedure Laterality Date  . BACK SURGERY  2005   "DISC  REPLACEMENT"   L5-6   . COLONOSCOPY  12/2011   TAx1, diverticulosis, rpt 5 yrs (Dr Myra Gianotti @ [facility])  . KNEE ARTHROPLASTY Left 2012  . TONSILLECTOMY    . TOTAL KNEE ARTHROPLASTY  11/12/2011   Procedure: TOTAL KNEE ARTHROPLASTY;  Surgeon: Shelda Pal, MD; Laterality: Right  . TOTAL KNEE ARTHROPLASTY Bilateral   . VARICOSE VEIN SURGERY Right    sclerotherapy    There were no vitals filed for this visit.    Pt. states he is doing well. R ankle continues to swell with increase walking during the day. No c/o pain or limitations reported.        There.ex:    Reviewed HEP in depth. Scifit L7 10 min. B LE (warm-up/no charge). 8" Airex step ups/ downs (forward/ lateral). Sled push/pull in hallway 45 feet x 4 (120#).  TM walking with focus on consistent heel strike/ increase L hip flexion (2.1 mph with 0%-5%-10%).   12" step ups/downs  with no UE assist 10x safely.   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 Resisted walking 155# forward/ backwards 5x.  125# lateral L/R 5x each.   TG knee flexion 20x/ heel raises 20x/ gastroc stretches 10x.           Pt. has progressed well towards all PT goals. No c/o R lower leg/ankle pain with good R knee/ankle AROM (all planes). R LE muscle strength grossly 5/5 MMT. Pt. has persistent R lower leg/ankle swelling, esp. with prolonged standing/walking tasks. Marked decrease in R lower leg tendenress with palpation/ retrograde massage. Discharge from PT at this time.     PT Long Term Goals - 01/12/18 0836      PT LONG TERM GOAL #1   Title  Pt. independent with HEP to increase R ankle PF/IV AROM to WNL as compared to L to improve ankle mobility.      Baseline  R ankle DF/PF WFL and EV (17 deg.), IV (40 deg.)- no pain    Time  4    Period  Weeks    Status  Achieved    Target Date  01/08/18      PT LONG TERM GOAL #2   Title  Pt. will increase B ankle stability  to WNL (PF strength to 4/5 MMT) to improve toe phase of gait/ step ups/downs with no pain.      Baseline  B ankle strength grossly 5/5 MMT    Time  4    Period  Weeks    Status  Achieved    Target Date  01/08/18      PT LONG TERM GOAL #3   Title  Pt. will increase FOTO score from 42 to 60 to improve pain-free mobility.      Baseline  FOTO score 42 on 11/25/17.  FOTO: 45 on 3/11    Time  4    Period  Weeks    Status  Unable to assess    Target Date  01/08/18      PT LONG TERM GOAL #4   Title  Pt. will ambulate community distances with normalized gait pattern and no c/o pain to improve pain-free mobility.      Baseline  Marked improvement in overall gait pattern/ endurance.  Slight R antalgic gait with initial steps after standing from chair.     Time  4    Period  Weeks    Status  Achieved    Target Date  01/08/18           Patient will benefit from skilled therapeutic intervention in order to improve the following  deficits and impairments:  Abnormal gait, Decreased balance, Decreased endurance, Decreased mobility, Difficulty walking, Hypomobility, Obesity, Decreased range of motion, Decreased activity tolerance, Decreased strength, Impaired flexibility  Visit Diagnosis: Stiffness of right ankle joint  Muscle weakness (generalized)  Pain in right ankle and joints of right foot  Difficulty in walking, not elsewhere classified     Problem List Patient Active Problem List   Diagnosis Date Noted  . Tibia/fibula fracture, right, closed, initial encounter 10/16/2017  . Crushing injury of left thumb 09/15/2017  . Mood disorder ([facility]) 04/02/2017  . OSA on CPAP 08/13/2016  . Severe obesity (BMI >= 40) ([facility]) 08/13/2016  . BPH associated with nocturia 08/13/2016  . Pedal edema 08/13/2016  . Dyslipidemia   . Healthcare maintenance 10/01/2013  . Vitamin B12 deficiency 09/13/2013  . Fatigue 08/16/2013   Cammie McgeeMichael C Aarron Wierzbicki, PT, DPT # [id number] 01/12/2018, 8:40 AM  [place] [facility] [address] [place], [place], [address] Phone: [phone]   Fax:  [phone]  Name: Steve Lynch MRN: [id number] Date of Birth: 03/29/1956

## 2018-02-18 DIAGNOSIS — F4311 Post-traumatic stress disorder, acute: Secondary | ICD-10-CM | POA: Diagnosis not present

## 2018-04-02 ENCOUNTER — Encounter: Payer: Self-pay | Admitting: Internal Medicine

## 2018-04-06 DIAGNOSIS — F4311 Post-traumatic stress disorder, acute: Secondary | ICD-10-CM | POA: Diagnosis not present

## 2018-04-10 ENCOUNTER — Ambulatory Visit (INDEPENDENT_AMBULATORY_CARE_PROVIDER_SITE_OTHER): Payer: BLUE CROSS/BLUE SHIELD | Admitting: Family Medicine

## 2018-04-10 ENCOUNTER — Encounter: Payer: Self-pay | Admitting: Family Medicine

## 2018-04-10 VITALS — BP 120/80 | HR 96 | Temp 98.4°F | Ht 77.0 in | Wt 332.2 lb

## 2018-04-10 DIAGNOSIS — F321 Major depressive disorder, single episode, moderate: Secondary | ICD-10-CM

## 2018-04-10 DIAGNOSIS — R05 Cough: Secondary | ICD-10-CM | POA: Diagnosis not present

## 2018-04-10 DIAGNOSIS — R059 Cough, unspecified: Secondary | ICD-10-CM

## 2018-04-10 MED ORDER — OMEPRAZOLE 40 MG PO CPDR: 40.0000 mg | DELAYED_RELEASE_CAPSULE | Freq: Every day | ORAL | 1 refills | Status: DC

## 2018-04-10 NOTE — Assessment & Plan Note (Signed)
Anticipate GERD related cough. No signs of infection, no significant allergic rhinitis symptoms today. Zoloft possibly contributing given temporal association - agree with trial decreased dose.  Discussed relation of weight gain to worsening reflux.  Start omeprazole 40mg  daily x3 wks then PRN.  Reviewed GERD precautions.  Update if no better with above for GI referral for further evaluation. Pt agrees with plan.

## 2018-04-10 NOTE — Patient Instructions (Signed)
I do think this is reflux related cough.  Treat with omeprazole 40mg  daily for next 3 weeks then as needed.  Try 1/2 tab zoloft.  Work on [employer]. Watch diet - avoid acid causing foods.  If no better after above, let me know for GI referral.

## 2018-04-10 NOTE — Assessment & Plan Note (Signed)
Reviewed recent treatment through psychiatry (zoloft) and planned psychology evaluation.
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
## 2018-04-20 DIAGNOSIS — F4310 Post-traumatic stress disorder, unspecified: Secondary | ICD-10-CM | POA: Diagnosis not present

## 2018-05-13 DIAGNOSIS — F4310 Post-traumatic stress disorder, unspecified: Secondary | ICD-10-CM | POA: Diagnosis not present

## 2018-06-24 ENCOUNTER — Ambulatory Visit (INDEPENDENT_AMBULATORY_CARE_PROVIDER_SITE_OTHER): Payer: BLUE CROSS/BLUE SHIELD | Admitting: Family Medicine

## 2018-06-24 ENCOUNTER — Encounter: Payer: Self-pay | Admitting: Family Medicine

## 2018-06-24 ENCOUNTER — Encounter

## 2018-06-24 VITALS — BP 128/80 | HR 74 | Temp 98.5°F | Ht 77.0 in | Wt 325.5 lb

## 2018-06-24 DIAGNOSIS — S82201S Unspecified fracture of shaft of right tibia, sequela: Secondary | ICD-10-CM

## 2018-06-24 DIAGNOSIS — S82401S Unspecified fracture of shaft of right fibula, sequela: Secondary | ICD-10-CM

## 2018-06-24 DIAGNOSIS — I8001 Phlebitis and thrombophlebitis of superficial vessels of right lower extremity: Secondary | ICD-10-CM | POA: Diagnosis not present

## 2018-06-24 DIAGNOSIS — R05 Cough: Secondary | ICD-10-CM | POA: Diagnosis not present

## 2018-06-24 DIAGNOSIS — R600 Localized edema: Secondary | ICD-10-CM | POA: Diagnosis not present

## 2018-06-24 DIAGNOSIS — R059 Cough, unspecified: Secondary | ICD-10-CM

## 2018-06-24 MED ORDER — DICLOFENAC SODIUM 1 % TD GEL: 1.0000 "application " | Freq: Three times a day (TID) | TRANSDERMAL | 1 refills | Status: DC

## 2018-06-24 MED ORDER — IBUPROFEN 600 MG PO TABS: 600.0000 mg | ORAL_TABLET | Freq: Three times a day (TID) | ORAL | 0 refills | Status: DC | PRN

## 2018-06-24 NOTE — Patient Instructions (Addendum)
I think you have superficial blood clot (superficial thrombophlebitis) but we do need to rule out deep blood clot.  For now, start ibuprofen 600mg  with meals (2-3 times a day), may try voltaren gel sent to pharmacy.  Elevate leg.  See our referral coordinators or we will Thal you in the mornign to schedule leg ultrasound.  If chest pain, shortness of breath, go to ER.

## 2018-06-24 NOTE — Assessment & Plan Note (Signed)
After prolonged air travel - Rx voltaren, ibuprofen 600mg  TID scheduled, leg elevation. Will need DVT ruled out - ordered for stat for tomorrow. Consider compression stockings if [place] negative for DVT. Red flags to seek ER care reviewed (dyspnea, chest pain). Pt agrees with plan.

## 2018-06-24 NOTE — Progress Notes (Signed)
BP 128/80 (BP Location: Left Arm, Patient Position: Sitting, Cuff Size: Large)   Pulse 74   Temp 98.5 F (36.9 C) (Oral)   Ht 6\' 5"  (1.956 m)   Wt (!) 325 lb 8 oz (147.6 kg)   SpO2 97%   BMI 38.60 kg/m    CC: leg swelling Subjective:    Patient ID: Steve Lynch, male    DOB: 1955/11/30, 62 y.o.   MRN: [id number]  HPI: Steve Lynch is a 62 y.o. male presenting on 06/24/2018 for Cyst (C/o bump on posterior right leg. Flew to OR last week and noticed tender bumps on posterior right leg. ); Joint Swelling (Also, has bilateral ankle swelling since returning from trip.); and Nevus (Wants mole on upper right leg checked. Has increased in size.)   Recent trip to [place] 2 wks ago for funeral - prolonged air flight. Some bilateral ankle swelling but worse after flight. On flight back noted lumps posterior R leg just below popliteal area. No fevers, redness, warmth of leg.  H/o remote sclerotherapy to right leg.  H/o L leg fracture 2018 with instrumentation (plate, rod and nails).   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 Lost father 2 wks ago.   Relevant past medical, surgical, family and social history reviewed and updated as indicated. Interim medical history since our last visit reviewed. Allergies and medications reviewed and updated. Outpatient Medications Prior to Visit  Medication Sig Dispense Refill  . Multiple Vitamin (MULTIVITAMIN) tablet Take 1 tablet by mouth daily.    Marland Kitchen omeprazole (PRILOSEC) 40 MG capsule Take 1 capsule (40 mg total) by mouth daily. 30 capsule 1  . sertraline (ZOLOFT) 100 MG tablet Take 1 tablet by mouth daily.  3  . vitamin C (ASCORBIC ACID) 500 MG tablet Take 500 mg by mouth daily. Takes only in Winter     No facility-administered medications prior to visit.      Per HPI unless specifically indicated in ROS section below Review of Systems     Objective:    BP 128/80 (BP Location: Left Arm, Patient Position: Sitting, Cuff Size: Large)   Pulse 74    Temp 98.5 F (36.9 C) (Oral)   Ht 6\' 5"  (1.956 m)   Wt (!) 325 lb 8 oz (147.6 kg)   SpO2 97%   BMI 38.60 kg/m   Wt Readings from Last 3 Encounters:  06/24/18 (!) 325 lb 8 oz (147.6 kg)  04/10/18 (!) 332 lb 4 oz (150.7 kg)  10/28/17 (!) 322 lb (146.1 kg)    Physical Exam  Constitutional: He appears well-developed and well-nourished. No distress.  Musculoskeletal: Normal range of motion. He exhibits edema (R sided 1+ pitting, L trace) and tenderness.  2 + DP bilaterally L calf circ 47cm R calf circ 52.5cm Very tender indurated superficial cords R posterior leg along superficial vein, mild warmth and marked swelling  Nursing note and vitals reviewed.     Assessment & Plan:   Problem List Items Addressed This Visit    Superficial thrombophlebitis of right leg - Primary    After prolonged air travel - Rx voltaren, ibuprofen 600mg  TID scheduled, leg elevation. Will need DVT ruled out - ordered for stat for tomorrow. Consider compression stockings if [place] negative for DVT. Red flags to seek ER care reviewed (dyspnea, chest pain). Pt agrees with plan.       Relevant Orders   US Venous Img Lower Unilateral Right   Pedal edema  Relevant Orders   US Venous Img Lower Unilateral Right   Fracture of tibia with fibula, closed, right, sequela   Cough    Improved with PPI          Meds ordered this encounter  Medications  . diclofenac sodium (VOLTAREN) 1 % GEL    Sig: Apply 1 application topically 3 (three) times daily.    Dispense:  1 Tube    Refill:  1  . ibuprofen (ADVIL,MOTRIN) 600 MG tablet    Sig: Take 1 tablet (600 mg total) by mouth every 8 (eight) hours as needed.    Refill:  0   Orders Placed This Encounter  Procedures  . US Venous Img Lower Unilateral Right    Standing Status:   Future    Standing Expiration Date:   08/25/2019    Order Specific Question:   Reason for Exam (SYMPTOM  OR DIAGNOSIS REQUIRED)    Answer:   R leg swelling, superficial thrombophlebitis      Order Specific Question:   Preferred imaging location?    Answer:   [facility]    Follow up plan: No follow-ups on file.  [name], MD

## 2018-06-24 NOTE — Assessment & Plan Note (Signed)
Improved with PPI  

## 2018-06-25 ENCOUNTER — Ambulatory Visit
Admission: RE | Admit: 2018-06-25 | Discharge: 2018-06-25 | Disposition: A | Discharge status: Home / Self Care | Payer: BLUE CROSS/BLUE SHIELD | Source: Ambulatory Visit | Attending: Family Medicine | Admitting: Family Medicine

## 2018-06-25 DIAGNOSIS — R600 Localized edema: Secondary | ICD-10-CM | POA: Diagnosis not present

## 2018-06-25 DIAGNOSIS — I8001 Phlebitis and thrombophlebitis of superficial vessels of right lower extremity: Secondary | ICD-10-CM | POA: Diagnosis not present

## 2018-06-25 DIAGNOSIS — I471 Supraventricular tachycardia: Secondary | ICD-10-CM | POA: Diagnosis not present

## 2018-06-30 ENCOUNTER — Encounter: Payer: Self-pay | Admitting: Family Medicine

## 2018-06-30 NOTE — Telephone Encounter (Signed)
This encounter was created in error - please disregard.

## 2018-07-06 ENCOUNTER — Ambulatory Visit: Payer: Self-pay | Admitting: Family Medicine

## 2018-08-03 ENCOUNTER — Encounter: Payer: Self-pay | Admitting: Family Medicine

## 2018-08-03 ENCOUNTER — Ambulatory Visit (INDEPENDENT_AMBULATORY_CARE_PROVIDER_SITE_OTHER): Payer: BLUE CROSS/BLUE SHIELD | Admitting: Family Medicine

## 2018-08-03 VITALS — BP 126/70 | HR 90 | Temp 98.2°F | Ht 77.0 in | Wt 318.2 lb

## 2018-08-03 DIAGNOSIS — I8001 Phlebitis and thrombophlebitis of superficial vessels of right lower extremity: Secondary | ICD-10-CM | POA: Diagnosis not present

## 2018-08-03 DIAGNOSIS — J208 Acute bronchitis due to other specified organisms: Secondary | ICD-10-CM

## 2018-08-03 DIAGNOSIS — B9689 Other specified bacterial agents as the cause of diseases classified elsewhere: Secondary | ICD-10-CM | POA: Insufficient documentation

## 2018-08-03 DIAGNOSIS — S2341XD Sprain of ribs, subsequent encounter: Secondary | ICD-10-CM | POA: Insufficient documentation

## 2018-08-03 DIAGNOSIS — R109 Unspecified abdominal pain: Secondary | ICD-10-CM | POA: Diagnosis not present

## 2018-08-03 DIAGNOSIS — M25512 Pain in left shoulder: Secondary | ICD-10-CM

## 2018-08-03 DIAGNOSIS — R10A1 Flank pain, right side: Secondary | ICD-10-CM

## 2018-08-03 LAB — POC URINALSYSI DIPSTICK (AUTOMATED)
BILIRUBIN UA: NEGATIVE
Glucose, UA: NEGATIVE
Ketones, UA: NEGATIVE
Leukocytes, UA: NEGATIVE
NITRITE UA: NEGATIVE
PH UA: 6 (ref 5.0–8.0)
PROTEIN UA: POSITIVE — AB
RBC UA: NEGATIVE
Spec Grav, UA: >=1.03 — AB (ref 1.010–1.025)
UROBILINOGEN UA: 0.2 U/dL

## 2018-08-03 MED ORDER — GUAIFENESIN-CODEINE 100-10 MG/5ML PO SYRP: 5.0000 mL | ORAL_SOLUTION | Freq: Two times a day (BID) | ORAL | 0 refills | Status: DC | PRN

## 2018-08-03 MED ORDER — AZITHROMYCIN 250 MG PO TABS: ORAL_TABLET | ORAL | 0 refills | Status: DC

## 2018-08-03 MED ORDER — PREDNISONE 20 MG PO TABS: ORAL_TABLET | ORAL | 0 refills | Status: DC

## 2018-08-03 NOTE — Assessment & Plan Note (Signed)
Actually more R lower posterior ribcage pain - reproducible, positional - anticipate ribcage strain from coughing. Supportive care reviewed.

## 2018-08-03 NOTE — Assessment & Plan Note (Signed)
Anticipate atypical given progression and duration of symptoms. Treat with zpack antibiotic, prednisone taper, and cheratussin for night time. Update if not improving with treatment.
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
## 2018-08-03 NOTE — Assessment & Plan Note (Signed)
Discussed pros/cons of anticoagulation. Offered venous US - pt declines. As largely improved, will just continue to monitor symptoms.

## 2018-08-03 NOTE — Progress Notes (Signed)
BP 126/70 (BP Location: Left Arm, Patient Position: Sitting, Cuff Size: Large)   Pulse 90   Temp 98.2 F (36.8 C) (Oral)   Ht 6\' 5"  (1.956 m)   Wt (!) 318 lb 4 oz (144.4 kg)   SpO2 97%   BMI 37.74 kg/m    CC: R flank pain, cough Subjective:    Patient ID: Steve Lynch, male    DOB: 05-Jan-1956, 62 y.o.   MRN: [id number]  HPI: Steve Lynch is a 62 y.o. male presenting on 08/03/2018 for Flank Pain (C/o right flank pain for 2 wks. ) and Cough (C/o continuous cough then has a coughing episoded until he coughs up mucous. States he deals with "this" every Fall. )   Several week h/o cough worse in the mornings, coughing fits associated with post-tussive emesis. Initially productive, now dry cough. URI symptoms initially, have now improved. Tends to get this in the fall. Some diaphoresis with coughing fits. Wife did have bronchitis recently.   No h/o asthma.  So far has tried nothing for this other than mucinex.   No fevers/chills, night sweats, ear or tooth pain.  Has tried claritin, zyrtec, flonase without improvement.  Summer 2019 had cough ?GERD related, treated with omeprazole with benefit.   R lower flank pain for last 2 weeks - positional, mainly noted at night time when he's trying to rest, worse when he lays down.   Recent superficial thrombophlebitis (early 06/2018) treated with ibuprofen, voltaren gel, compression stockings. This pain and swelling has largely resolved.   Oh by the way - L shoulder pain ongoing for a few weeks, worse when he's trying to sleep.   Relevant past medical, surgical, family and social history reviewed and updated as indicated. Interim medical history since our last visit reviewed. Allergies and medications reviewed and updated. Outpatient Medications Prior to Visit  Medication Sig Dispense Refill  . diclofenac sodium (VOLTAREN) 1 % GEL Apply 1 application topically 3 (three) times daily. 1 Tube 1  . ibuprofen (ADVIL,MOTRIN) 600 MG tablet Take 1  tablet (600 mg total) by mouth every 8 (eight) hours as needed.  0  . Multiple Vitamin (MULTIVITAMIN) tablet Take 1 tablet by mouth daily.    Marland Kitchen omeprazole (PRILOSEC) 40 MG capsule Take 1 capsule (40 mg total) by mouth daily. 30 capsule 1  . sertraline (ZOLOFT) 100 MG tablet Take 1 tablet by mouth daily.  3  . vitamin C (ASCORBIC ACID) 500 MG tablet Take 500 mg by mouth daily. Takes only in Winter     No facility-administered medications prior to visit.      Per HPI unless specifically indicated in ROS section below Review of Systems     Objective:    BP 126/70 (BP Location: Left Arm, Patient Position: Sitting, Cuff Size: Large)   Pulse 90   Temp 98.2 F (36.8 C) (Oral)   Ht 6\' 5"  (1.956 m)   Wt (!) 318 lb 4 oz (144.4 kg)   SpO2 97%   BMI 37.74 kg/m   Wt Readings from Last 3 Encounters:  08/03/18 (!) 318 lb 4 oz (144.4 kg)  06/24/18 (!) 325 lb 8 oz (147.6 kg)  04/10/18 (!) 332 lb 4 oz (150.7 kg)    Physical Exam  Constitutional: He appears well-developed and well-nourished. No distress.  HENT:  Head: Normocephalic and atraumatic.  Right Ear: Hearing, tympanic membrane, external ear and ear canal normal.  Left Ear: Hearing, tympanic membrane, external ear and ear canal normal.  Nose: Nose normal. No mucosal edema or rhinorrhea. Right sinus exhibits no maxillary sinus tenderness and no frontal sinus tenderness. Left sinus exhibits no maxillary sinus tenderness and no frontal sinus tenderness.  Mouth/Throat: Uvula is midline, oropharynx is clear and moist and mucous membranes are normal. No oropharyngeal exudate, posterior oropharyngeal edema, posterior oropharyngeal erythema or tonsillar abscesses.  Eyes: Pupils are equal, round, and reactive to light. Conjunctivae and EOM are normal. No scleral icterus.  Neck: Normal range of motion. Neck supple.  Cardiovascular: Normal rate, regular rhythm, normal heart sounds and intact distal pulses.  No murmur heard. Pulmonary/Chest: Effort  normal. No respiratory distress. He has no decreased breath sounds. He has no wheezes. He has rhonchi (LLL). He has no rales. He exhibits tenderness (R posterior lower ribcage tender to palpation).  Lymphadenopathy:    He has no cervical adenopathy.  Skin: Skin is warm and dry. No rash noted.  Nursing note and vitals reviewed.  Results for orders placed or performed in visit on 08/03/18  POCT Urinalysis Dipstick (Automated)  Result Value Ref Range   Color, UA yellow    Clarity, UA clear    Glucose, UA Negative Negative   Bilirubin, UA negative    Ketones, UA negative    Spec Grav, UA >=1.030 (A) 1.010 - 1.025   Blood, UA negative    pH, UA 6.0 5.0 - 8.0   Protein, UA Positive (A) Negative   Urobilinogen, UA 0.2 0.2 or 1.0 E.U./dL   Nitrite, UA negative    Leukocytes, UA Negative Negative      Assessment & Plan:   Problem List Items Addressed This Visit    Superficial thrombophlebitis of right leg    Discussed pros/cons of anticoagulation. Offered venous US - pt declines. As largely improved, will just continue to monitor symptoms.       Severe obesity (BMI >= 40) (HCC)    Congratulated on noted weight loss      Right flank pain    Actually more R lower posterior ribcage pain - reproducible, positional - anticipate ribcage strain from coughing. Supportive care reviewed.       Relevant Orders   POCT Urinalysis Dipstick (Automated) (Completed)   Left shoulder pain    Point tender to long head of biceps tendon, + speed test - anticipate biceps tendinopathy. Will mail biceps tendinopathy exercises to do at home.       Acute bacterial bronchitis - Primary    Anticipate atypical given progression and duration of symptoms. Treat with zpack antibiotic, prednisone taper, and cheratussin for night time. Update if not improving with treatment.       Relevant Medications   azithromycin (ZITHROMAX) 250 MG tablet       Meds ordered this encounter  Medications  . azithromycin  (ZITHROMAX) 250 MG tablet    Sig: Take two tablets on day one followed by one tablet on days 2-5    Dispense:  6 each    Refill:  0  . predniSONE (DELTASONE) 20 MG tablet    Sig: Take two tablets daily for 3 days followed by one tablet daily for 4 days    Dispense:  10 tablet    Refill:  0  . guaiFENesin-codeine (CHERATUSSIN AC) 100-10 MG/5ML syrup    Sig: Take 5 mLs by mouth 2 (two) times daily as needed.    Dispense:  120 mL    Refill:  0   Orders Placed This Encounter  Procedures  . POCT Urinalysis  Dipstick (Automated)    Follow up plan: Return if symptoms worsen or fail to improve.  Ria Bush, MD

## 2018-08-03 NOTE — Assessment & Plan Note (Signed)
Congratulated on noted weight loss.  

## 2018-08-03 NOTE — Assessment & Plan Note (Signed)
Point tender to long head of biceps tendon, + speed test - anticipate biceps tendinopathy. Will mail biceps tendinopathy exercises to do at home.

## 2018-08-04 DIAGNOSIS — F4311 Post-traumatic stress disorder, acute: Secondary | ICD-10-CM | POA: Diagnosis not present

## 2018-08-10 NOTE — Progress Notes (Signed)
BP 122/70 (BP Location: Right Arm, Patient Position: Sitting, Cuff Size: Large)   Pulse 94   Temp 97.9 F (36.6 C) (Oral)   Ht 6\' 5"  (1.956 m)   Wt (!) 316 lb 8 oz (143.6 kg)   SpO2 97%   BMI 37.53 kg/m    CC: f/u side pain Subjective:    Patient ID: Steve Lynch, male    DOB: [birth year], 62 y.o.   MRN: [id number]  HPI: Steve Lynch is a 62 y.o. male presenting on 08/11/2018 for Flank Pain (States right flank pain improved with meds given on 08/03/18. But by the weekend, pain and cough was back. )   Seen here last month with diagnosis of bacterial bronchitis - treated with azithromycin, prednisone, codeine cough syrup. At that time he was having R flank pain for 2 weeks that was positional in nature worse at night when laying down. This was thought musculoskeletal possibly ribcage strain. Symptoms improved with above treatment, but by the end of the weekend, cough and R posterior lower ribcage pain had recurred.   Persistent dry nagging cough, persistent and worsening R flank pain yesterday > today. Positional pain. No fevers, no productive cough.   Relevant past medical, surgical, family and social history reviewed and updated as indicated. Interim medical history since our last visit reviewed. Allergies and medications reviewed and updated. Outpatient Medications Prior to Visit  Medication Sig Dispense Refill  . diclofenac sodium (VOLTAREN) 1 % GEL Apply 1 application topically 3 (three) times daily. 1 Tube 1  . ibuprofen (ADVIL,MOTRIN) 600 MG tablet Take 1 tablet (600 mg total) by mouth every 8 (eight) hours as needed.  0  . Multiple Vitamin (MULTIVITAMIN) tablet Take 1 tablet by mouth daily.    Marland Kitchen omeprazole (PRILOSEC) 40 MG capsule Take 1 capsule (40 mg total) by mouth daily. 30 capsule 1  . sertraline (ZOLOFT) 100 MG tablet Take 1 tablet by mouth daily.  3  . vitamin C (ASCORBIC ACID) 500 MG tablet Take 500 mg by mouth daily. Takes only in Winter    . azithromycin (ZITHROMAX)  250 MG tablet Take two tablets on day one followed by one tablet on days 2-5 6 each 0  . guaiFENesin-codeine (CHERATUSSIN AC) 100-10 MG/5ML syrup Take 5 mLs by mouth 2 (two) times daily as needed. 120 mL 0  . predniSONE (DELTASONE) 20 MG tablet Take two tablets daily for 3 days followed by one tablet daily for 4 days 10 tablet 0   No facility-administered medications prior to visit.      Per HPI unless specifically indicated in ROS section below Review of Systems     Objective:    BP 122/70 (BP Location: Right Arm, Patient Position: Sitting, Cuff Size: Large)   Pulse 94   Temp 97.9 F (36.6 C) (Oral)   Ht 6\' 5"  (1.956 m)   Wt (!) 316 lb 8 oz (143.6 kg)   SpO2 97%   BMI 37.53 kg/m   Wt Readings from Last 3 Encounters:  08/11/18 (!) 316 lb 8 oz (143.6 kg)  08/03/18 (!) 318 lb 4 oz (144.4 kg)  06/24/18 (!) 325 lb 8 oz (147.6 kg)    Physical Exam  Constitutional: He appears well-developed and well-nourished. No distress.  HENT:  Head: Normocephalic and atraumatic.  Mouth/Throat: Oropharynx is clear and moist. No oropharyngeal exudate.  Eyes: Pupils are equal, round, and reactive to light. EOM are normal.  Cardiovascular: Normal rate, regular rhythm and normal heart sounds.  No murmur heard. Pulmonary/Chest: Effort normal and breath sounds normal. No respiratory distress. He has no wheezes. He has no rales.     He exhibits tenderness (reproducible posterior lower ribcage tenderness to palpation).  Nursing note and vitals reviewed.  Results for orders placed or performed in visit on 08/03/18  POCT Urinalysis Dipstick (Automated)  Result Value Ref Range   Color, UA yellow    Clarity, UA clear    Glucose, UA Negative Negative   Bilirubin, UA negative    Ketones, UA negative    Spec Grav, UA >=1.030 (A) 1.010 - 1.025   Blood, UA negative    pH, UA 6.0 5.0 - 8.0   Protein, UA Positive (A) Negative   Urobilinogen, UA 0.2 0.2 or 1.0 E.U./dL   Nitrite, UA negative     Leukocytes, UA Negative Negative   Lab Results  Component Value Date   CREATININE 0.96 08/09/2016   BUN 22 08/09/2016   NA 141 08/09/2016   K 4.3 08/09/2016   CL 108 08/09/2016   CO2 29 08/09/2016       Assessment & Plan:   Problem List Items Addressed This Visit    Rib sprain, subsequent encounter - Primary    He did have relief from pain while on prednisone course, but symptoms have recurred with a vengeance over the last 2 days. Persistent reproducible tenderness to palpation at lower right ribcage. Check CXR today - clear on my read. Treat as ribcage sprain with scheduled NSAID, heating pad, and muscle relaxant course. Update if not improving with treatment. Pt agrees with plan.       Relevant Orders   DG Chest 2 View   Acute bacterial bronchitis    This was treated last week with benefit - anticipate current cough is attributable to post bronchitis inflammation. rec ibuprofen 600mg  with meals x3d.       Relevant Orders   DG Chest 2 View    Other Visit Diagnoses    Rib pain       Relevant Orders   DG Chest 2 View       Meds ordered this encounter  Medications  . methocarbamol (ROBAXIN) 500 MG tablet    Sig: Take 1-2 tablets (500-1,000 mg total) by mouth 2 (two) times daily as needed for muscle spasms (sedation precautions).    Dispense:  30 tablet    Refill:  0   Orders Placed This Encounter  Procedures  . DG Chest 2 View    Standing Status:   Future    Number of Occurrences:   1    Standing Expiration Date:   10/12/2019    Order Specific Question:   Reason for Exam (SYMPTOM  OR DIAGNOSIS REQUIRED)    Answer:   R posterior ribcage pain with persistent cough    Order Specific Question:   Preferred imaging location?    Answer:   [facility]    Order Specific Question:   Radiology Contrast Protocol - do NOT remove file path    Answer:   [email or web address]    Follow up plan: Return if symptoms worsen or fail to  improve.  [name], MD

## 2018-08-11 ENCOUNTER — Ambulatory Visit (INDEPENDENT_AMBULATORY_CARE_PROVIDER_SITE_OTHER): Payer: BLUE CROSS/BLUE SHIELD | Admitting: Family Medicine

## 2018-08-11 ENCOUNTER — Encounter: Payer: Self-pay | Admitting: Family Medicine

## 2018-08-11 ENCOUNTER — Ambulatory Visit (INDEPENDENT_AMBULATORY_CARE_PROVIDER_SITE_OTHER)
Admission: RE | Admit: 2018-08-11 | Discharge: 2018-08-11 | Disposition: A | Discharge status: Home / Self Care | Payer: BLUE CROSS/BLUE SHIELD | Source: Ambulatory Visit | Attending: Family Medicine | Admitting: Family Medicine

## 2018-08-11 VITALS — BP 122/70 | HR 94 | Temp 97.9°F | Ht 77.0 in | Wt 316.5 lb

## 2018-08-11 DIAGNOSIS — B9689 Other specified bacterial agents as the cause of diseases classified elsewhere: Secondary | ICD-10-CM

## 2018-08-11 DIAGNOSIS — R0781 Pleurodynia: Secondary | ICD-10-CM | POA: Diagnosis not present

## 2018-08-11 DIAGNOSIS — R109 Unspecified abdominal pain: Secondary | ICD-10-CM | POA: Diagnosis not present

## 2018-08-11 DIAGNOSIS — S2341XD Sprain of ribs, subsequent encounter: Secondary | ICD-10-CM | POA: Diagnosis not present

## 2018-08-11 DIAGNOSIS — R05 Cough: Secondary | ICD-10-CM | POA: Diagnosis not present

## 2018-08-11 DIAGNOSIS — J208 Acute bronchitis due to other specified organisms: Secondary | ICD-10-CM | POA: Diagnosis not present

## 2018-08-11 MED ORDER — METHOCARBAMOL 500 MG PO TABS: 500.0000 mg | ORAL_TABLET | Freq: Two times a day (BID) | ORAL | 0 refills | Status: DC | PRN

## 2018-08-11 NOTE — Patient Instructions (Addendum)
Xray looking ok - I think you have persistent ribcage sprain - treat with heating pad to back, ibuprofen anti inflammatory course, muscle relaxant sent to pharmacy.  Take ibuprofen 600mg  with meals for next 3-5 days. Robaxin muscle relaxant for flank.  I think persistent cough is coming from ongoing lung inflammation after bronchitis. Anti inflammatory should help cough as well.  Let us know if not improving with this.

## 2018-08-11 NOTE — Assessment & Plan Note (Addendum)
He did have relief from pain while on prednisone course, but symptoms have recurred with a vengeance over the last 2 days. Persistent reproducible tenderness to palpation at lower right ribcage. Check CXR today - clear on my read. Treat as ribcage sprain with scheduled NSAID, heating pad, and muscle relaxant course. Update if not improving with treatment. Pt agrees with plan.

## 2018-08-11 NOTE — Assessment & Plan Note (Addendum)
This was treated last week with benefit - anticipate current cough is attributable to post bronchitis inflammation. rec ibuprofen 600mg  with meals x3d.

## 2018-09-02 DIAGNOSIS — N390 Urinary tract infection, site not specified: Secondary | ICD-10-CM | POA: Diagnosis not present

## 2018-10-10 ENCOUNTER — Ambulatory Visit (INDEPENDENT_AMBULATORY_CARE_PROVIDER_SITE_OTHER): Payer: BLUE CROSS/BLUE SHIELD | Admitting: Primary Care

## 2018-10-10 VITALS — BP 100/70 | HR 69 | Temp 98.0°F | Wt 303.0 lb

## 2018-10-10 DIAGNOSIS — R229 Localized swelling, mass and lump, unspecified: Secondary | ICD-10-CM

## 2018-10-10 NOTE — Progress Notes (Addendum)
Subjective:    Patient ID: Steve Lynch, male    DOB: 11/14/1955, 62 y.o.   MRN: [id number]  HPI  Mr. Feijoo is a 62 year old male with a history of pedal edema, depression, OSA, superficial thrombophlebitis of right lower extremity to who [facility] with a chief complaint of spot to the toe.  The spot is located to the tip of the right great toe which he noticed one week ago. The spot has widened over the last several days. He's worried as his wife had these same symptoms and ended up having a fungal infection requiring the removal of her toe nail.  He denies pain, fevers, drainage, redness. He works for [employer] and is on his feet often, increased hours and has been on his feet longer than usual since October 2019. He has recently used new brown socks about two weeks ago, did not wash before wearing, has washed since.   He's not done anything OTC for his symptoms. Of note, he does intermittently avoid wearing socks with shoes on the weekends, has been doing this over the last 6 months.  Review of Systems  Constitutional: Negative for fever.  Cardiovascular: Negative for leg swelling.  Skin: Positive for color change. Negative for rash and wound.       Past Medical History:  Diagnosis Date  . Drug abuse in remission (HCC) 1990s   Cocaine with rehab  . Dyslipidemia    mild  . Peripheral vascular disease (HCC) 2004   varicose veins right leg  . Tendonitis 10/2010   temporal tendonitis with locked jaw following tooth extraction  . Vitamin B12 deficiency 09/2013     Social History   Socioeconomic History  . Marital status: Married    Spouse name: Not on file  . Number of children: Not on file  . Years of education: Not on file  . Highest education level: Not on file  Occupational History  . 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 Financial resource strain: Not on file  . Food insecurity:    Worry: Not on file    Inability: Not on file  .  Transportation needs:    Medical: Not on file    Non-medical: Not on file  Tobacco Use  . Smoking status: Never Smoker  . Smokeless tobacco: Never Used  Substance and Sexual Activity  . Alcohol use: Yes    Comment: Very rare  . Drug use: No    Comment: COCAINE ADDICTION WITH REHAB   21 YEARS AGO  . Sexual activity: Not on file  Lifestyle  . Physical activity:    Days per week: Not on file    Minutes per session: Not on file  . Stress: Not on file  Relationships  . Social connections:    Talks on phone: Not on file    Gets together: Not on file    Attends religious service: Not on file    Active member of club or organization: Not on file    Attends meetings of clubs or organizations: Not on file    Relationship status: Not on file  . Intimate partner violence:    Fear of current or ex partner: Not on file    Emotionally abused: Not on file    Physically abused: Not on file    Forced sexual activity: Not on file  Other Topics Concern  . Not on file  Social History Narrative   Lives with  wife, 1 dog   Occupation: [employer]   Edu: 2 yrs college   Activity: no regular exercise besides work   Diet: some water, fruits/vegetables daily   Involved in [employer] organization on [employer] ([place] of [place]    Past Surgical History:  Procedure Laterality Date  . BACK SURGERY  2005   "DISC  REPLACEMENT"   L5-6   . COLONOSCOPY  12/2011   TAx1, diverticulosis, rpt 5 yrs (Dr [name] @ [facility])  . KNEE ARTHROPLASTY Left 2012  . TONSILLECTOMY    . TOTAL KNEE ARTHROPLASTY  11/12/2011   Procedure: TOTAL KNEE ARTHROPLASTY;  Surgeon: Shelda PalMatthew D Olin, MD; Laterality: Right  . TOTAL KNEE ARTHROPLASTY Bilateral   . VARICOSE VEIN SURGERY Right    sclerotherapy    Family History  Problem Relation Age of Onset  . Hyperlipidemia Mother   . Hypertension Mother   . Diabetes Mother   . ALS Sister   . Cancer Neg Hx   . CAD Neg Hx   . Stroke Neg Hx      Allergies  Allergen Reactions  . Oxycodone Rash    Current Outpatient Medications on File Prior to Visit  Medication Sig Dispense Refill  . diclofenac sodium (VOLTAREN) 1 % GEL Apply 1 application topically 3 (three) times daily. 1 Tube 1  . ibuprofen (ADVIL,MOTRIN) 600 MG tablet Take 1 tablet (600 mg total) by mouth every 8 (eight) hours as needed.  0  . methocarbamol (ROBAXIN) 500 MG tablet Take 1-2 tablets (500-1,000 mg total) by mouth 2 (two) times daily as needed for muscle spasms (sedation precautions). 30 tablet 0  . Multiple Vitamin (MULTIVITAMIN) tablet Take 1 tablet by mouth daily.    Marland Kitchen. omeprazole (PRILOSEC) 40 MG capsule Take 1 capsule (40 mg total) by mouth daily. 30 capsule 1  . sertraline (ZOLOFT) 100 MG tablet Take 1 tablet by mouth daily.  3  . vitamin C (ASCORBIC ACID) 500 MG tablet Take 500 mg by mouth daily. Takes only in Winter     No current facility-administered medications on file prior to visit.     BP 100/70 (BP Location: Left Arm, Patient Position: Sitting, Cuff Size: Large)   Pulse 69   Temp 98 F (36.7 C) (Oral)   Wt (!) 303 lb (137.4 kg)   SpO2 97%   BMI 35.93 kg/m    Objective:   Physical Exam  Constitutional: He appears well-nourished.  Is not wearing socks today. Feet and shoes smell of moisture/sweat.   Cardiovascular: Normal rate.  Respiratory: Effort normal.  Musculoskeletal:     Right foot: Normal.       Feet:  Skin: Skin is warm and dry. No erythema.  Medium brown vertical linear line to tip of right great toe. Non tender, no erythema, no drainage. Tougher skin with callous. Toenails bilaterally appear normal.           Assessment & Plan:  Toe Callous:  Skin to area of concern appears to be calloused. No obvious infection to toe, toenail border, or nail bed. Will have him try more supportive shoes, better socks. Discussed options for soaking in warm betadine solution to prevent infection. Also discussed topical steroid.   Referral placed for podiatry evaluation if symptoms do not improve. Follow up with PCP as needed.  Doreene NestKatherine K Clark, NP

## 2018-10-10 NOTE — Patient Instructions (Signed)
Try soaking your foot in warm water with one small cap full of betadine solution. This would prevent any potential infection.  You can also try topical antifungal cream such as clotrimazole.   You will be contacted regarding your referral to podiatry.  Please let us know if you have not been contacted within one week.   It was a pleasure meeting you!

## 2019-06-01 ENCOUNTER — Other Ambulatory Visit: Payer: Self-pay

## 2019-06-01 DIAGNOSIS — Z20822 Contact with and (suspected) exposure to covid-19: Secondary | ICD-10-CM

## 2019-06-02 LAB — SPECIMEN STATUS REPORT

## 2019-06-02 LAB — NOVEL CORONAVIRUS, NAA: SARS-CoV-2, NAA: NOT DETECTED

## 2019-06-07 ENCOUNTER — Ambulatory Visit: Payer: 59 | Admitting: Family Medicine

## 2019-06-07 ENCOUNTER — Other Ambulatory Visit: Payer: Self-pay

## 2019-06-07 ENCOUNTER — Encounter: Payer: Self-pay | Admitting: Family Medicine

## 2019-06-07 VITALS — BP 122/70 | HR 74 | Temp 97.8°F | Ht 77.0 in | Wt 330.1 lb

## 2019-06-07 DIAGNOSIS — M79605 Pain in left leg: Secondary | ICD-10-CM | POA: Insufficient documentation

## 2019-06-07 NOTE — Progress Notes (Signed)
This visit was conducted in person.  BP 122/70 (BP Location: Left Arm, Patient Position: Sitting, Cuff Size: Large)   Pulse 74   Temp 97.8 F (36.6 C) (Temporal)   Ht 6\' 5"  (1.956 m)   Wt (!) 330 lb 1 oz (149.7 kg)   SpO2 98%   BMI 39.14 kg/m    CC: check L left Subjective:    Patient ID: Steve Lynch, male    DOB: 04/19/1956, 63 y.o.   MRN: [id number]  HPI: Steve Merlinerry R Dalia is a 63 y.o. male presenting on 06/07/2019 for Knee Pain (C/o left knee pain. Feels pain in lateral side of leg from mid hip radiating down to mid lower leg.  Tried ibuprofen but causes brusing.  Seen by ortho and told it must be muscular and to see PCP.  Pt has to sleep on stomach. )   Sold [employer] company 12/2018. Started working at [employer]. He is on his feet 8 hours a day. Does use supportive shoes ([place]ket[place] workshoe with Dr Margart SicklesScholl's insoles).   4 mo h/o L lateral leg pain from hip to below knee.  Pain with every step, worse going up stairs.  No back pain, buttock pain.  Denies inciting trauma/injury or fall.   Ibuprofen helps pain but causes increasing bruising. Aleve hasn't helped.  Planning to see trainer starting today.   H/o B TKR - ortho (Dr Charlann Boxerlin) told knee was ok, likely muscular pain.      Relevant past medical, surgical, family and social history reviewed and updated as indicated. Interim medical history since our last visit reviewed. Allergies and medications reviewed and updated. Outpatient Medications Prior to Visit  Medication Sig Dispense Refill  . diclofenac sodium (VOLTAREN) 1 % GEL Apply 1 application topically 3 (three) times daily. 1 Tube 1  . ibuprofen (ADVIL,MOTRIN) 600 MG tablet Take 1 tablet (600 mg total) by mouth every 8 (eight) hours as needed.  0  . methocarbamol (ROBAXIN) 500 MG tablet Take 1-2 tablets (500-1,000 mg total) by mouth 2 (two) times daily as needed for muscle spasms (sedation precautions). 30 tablet 0  . Multiple Vitamin (MULTIVITAMIN) tablet Take 1 tablet by mouth  daily.    Marland Kitchen. omeprazole (PRILOSEC) 40 MG capsule Take 1 capsule (40 mg total) by mouth daily. 30 capsule 1  . sertraline (ZOLOFT) 100 MG tablet Take 1 tablet by mouth daily.  3  . vitamin C (ASCORBIC ACID) 500 MG tablet Take 500 mg by mouth daily. Takes only in Winter     No facility-administered medications prior to visit.      Per HPI unless specifically indicated in ROS section below Review of Systems Objective:    BP 122/70 (BP Location: Left Arm, Patient Position: Sitting, Cuff Size: Large)   Pulse 74   Temp 97.8 F (36.6 C) (Temporal)   Ht 6\' 5"  (1.956 m)   Wt (!) 330 lb 1 oz (149.7 kg)   SpO2 98%   BMI 39.14 kg/m   Wt Readings from Last 3 Encounters:  06/07/19 (!) 330 lb 1 oz (149.7 kg)  10/10/18 (!) 303 lb (137.4 kg)  08/11/18 (!) 316 lb 8 oz (143.6 kg)    Physical Exam Vitals signs and nursing note reviewed.  Constitutional:      General: He is not in acute distress.    Appearance: Normal appearance. He is not ill-appearing.  Musculoskeletal: Normal range of motion.     Comments:  S/p bilat knee replacements No lower back pain  Neg seated SLR R knee without pain to palpation L knee tender to palpation lateral joint line at lateral femoral epicondyle, also tender to palpation over L trochanteric bursitis. No pain at anserine bursitis. No pain with flexion/extension at knee.   Skin:    General: Skin is warm and dry.     Findings: No rash.  Neurological:     Mental Status: He is alert.  Psychiatric:        Mood and Affect: Mood normal.        Behavior: Behavior normal.       Assessment & Plan:   Problem List Items Addressed This Visit    Left leg pain - Primary    Story/exam most consistent with iliotibial band syndrome. Reviewed overuse injury cause and treatment with patient. rec ice, elevation, avoiding repetitive movements, topical voltaren gel, continue NSAID. SM pt advisor handout provided today. Update if not improving with treatment.           No  orders of the defined types were placed in this encounter.  No orders of the defined types were placed in this encounter.   Patient Instructions  I think you have IT band syndrome - overuse injury.  Treat with OTC topical voltaren gel to tender area over knee, ice to area (covered in towel), and work with trainer to strengthen and loosen leg muscles. May continue ibuprofen. Let us know if not improving with treatment.    Follow up plan: Return if symptoms worsen or fail to improve.  Ria Bush, MD

## 2019-06-07 NOTE — Assessment & Plan Note (Signed)
Story/exam most consistent with iliotibial band syndrome. Reviewed overuse injury cause and treatment with patient. rec ice, elevation, avoiding repetitive movements, topical voltaren gel, continue NSAID. SM pt advisor handout provided today. Update if not improving with treatment.

## 2019-06-07 NOTE — Patient Instructions (Addendum)
I think you have IT band syndrome - overuse injury.  Treat with OTC topical voltaren gel to tender area over knee, ice to area (covered in towel), and work with trainer to strengthen and loosen leg muscles. May continue ibuprofen. Let us know if not improving with treatment.

## 2019-06-08 ENCOUNTER — Encounter: Payer: Self-pay | Admitting: Family Medicine

## 2019-06-08 ENCOUNTER — Ambulatory Visit (INDEPENDENT_AMBULATORY_CARE_PROVIDER_SITE_OTHER): Payer: 59 | Admitting: Family Medicine

## 2019-06-08 VITALS — BP 120/76 | HR 77 | Temp 98.0°F | Ht 77.0 in | Wt 328.6 lb

## 2019-06-08 DIAGNOSIS — L918 Other hypertrophic disorders of the skin: Secondary | ICD-10-CM | POA: Diagnosis not present

## 2019-06-08 NOTE — Progress Notes (Signed)
S: The patient complains of symptomatic skin tag on the R anterior thigh. This is irritated by clothing, and rubbing.  O: Patient appears well. Several benign skin tag on R anterior thigh.   A: Skin tags   P: Skin tag snipped off using alcohol for cleansing and sterile iris scissors. Local anesthesia with epi was used. Ethyl chloride used as well. This pathognomonic lesion is not sent for pathology.

## 2019-06-08 NOTE — Patient Instructions (Signed)
Skin tag removed today.   Skin Tag, Adult  A skin tag (acrochordon) is a soft, extra growth of skin. Most skin tags are flesh-colored and rarely bigger than a pencil eraser. They commonly form near areas where there are folds in the skin, such as the armpit or groin. Skin tags are not dangerous, and they do not spread from person to person (are not contagious). You may have one skin tag or several. Skin tags do not require treatment. However, your health care provider may recommend removal of a skin tag if it:  Gets irritated from clothing.  Bleeds.  Is visible and unsightly. Your health care provider can remove skin tags with a simple surgical procedure or a procedure that involves freezing the skin tag. Follow these instructions at home:  Watch for any changes in your skin tag. A normal skin tag does not require any other special care at home.  Take over-the-counter and prescription medicines only as told by your health care provider.  Keep all follow-up visits as told by your health care provider. This is important. Contact a health care provider if:  You have a skin tag that: ? Becomes painful. ? Changes color. ? Bleeds. ? Swells.  You develop more skin tags. This information is not intended to replace advice given to you by your health care provider. Make sure you discuss any questions you have with your health care provider. Document Released: 10/15/2015 Document Revised: 09/12/2017 Document Reviewed: 10/15/2015 Elsevier Patient Education  2020 [employer].

## 2019-07-05 ENCOUNTER — Other Ambulatory Visit: Payer: Self-pay

## 2019-07-05 DIAGNOSIS — Z20822 Contact with and (suspected) exposure to covid-19: Secondary | ICD-10-CM

## 2019-07-07 ENCOUNTER — Telehealth: Payer: Self-pay | Admitting: *Deleted

## 2019-07-07 LAB — NOVEL CORONAVIRUS, NAA: SARS-CoV-2, NAA: NOT DETECTED

## 2019-07-07 NOTE — Telephone Encounter (Signed)
Reviewed negative covid19 results with patient. No questions asked.  

## 2019-09-15 ENCOUNTER — Other Ambulatory Visit: Payer: Self-pay | Admitting: *Deleted

## 2019-09-15 DIAGNOSIS — Z20822 Contact with and (suspected) exposure to covid-19: Secondary | ICD-10-CM

## 2019-09-18 LAB — NOVEL CORONAVIRUS, NAA: SARS-CoV-2, NAA: NOT DETECTED

## 2019-09-20 ENCOUNTER — Telehealth: Payer: Self-pay | Admitting: *Deleted

## 2019-09-20 NOTE — Telephone Encounter (Signed)
Patient called given negative covid results . 

## 2019-09-27 ENCOUNTER — Encounter: Payer: Self-pay | Admitting: Emergency Medicine

## 2019-09-27 ENCOUNTER — Emergency Department: Payer: No Typology Code available for payment source

## 2019-09-27 ENCOUNTER — Emergency Department
Admission: EM | Admit: 2019-09-27 | Discharge: 2019-09-27 | Disposition: A | Discharge status: Home / Self Care | Payer: No Typology Code available for payment source | Attending: Emergency Medicine | Admitting: Emergency Medicine

## 2019-09-27 ENCOUNTER — Other Ambulatory Visit: Payer: Self-pay

## 2019-09-27 ENCOUNTER — Telehealth: Payer: Self-pay

## 2019-09-27 DIAGNOSIS — N23 Unspecified renal colic: Secondary | ICD-10-CM

## 2019-09-27 DIAGNOSIS — R1032 Left lower quadrant pain: Secondary | ICD-10-CM | POA: Diagnosis present

## 2019-09-27 DIAGNOSIS — Z96653 Presence of artificial knee joint, bilateral: Secondary | ICD-10-CM | POA: Insufficient documentation

## 2019-09-27 LAB — COMPREHENSIVE METABOLIC PANEL
ALT: 15 U/L (ref 0–44)
AST: 17 U/L (ref 15–41)
Albumin: 4 g/dL (ref 3.5–5.0)
Alkaline Phosphatase: 91 U/L (ref 38–126)
Anion gap: 7 (ref 5–15)
BUN: 25 mg/dL — ABNORMAL HIGH (ref 8–23)
CO2: 24 mmol/L (ref 22–32)
Calcium: 8.9 mg/dL (ref 8.9–10.3)
Chloride: 107 mmol/L (ref 98–111)
Creatinine, Ser: 1.51 mg/dL — ABNORMAL HIGH (ref 0.61–1.24)
GFR calc Af Amer: 57 mL/min — ABNORMAL LOW (ref >60)
GFR calc non Af Amer: 49 mL/min — ABNORMAL LOW (ref >60)
Glucose, Bld: 136 mg/dL — ABNORMAL HIGH (ref 70–99)
Potassium: 3.8 mmol/L (ref 3.5–5.1)
Sodium: 138 mmol/L (ref 135–145)
Total Bilirubin: 0.7 mg/dL (ref 0.3–1.2)
Total Protein: 8.2 g/dL — ABNORMAL HIGH (ref 6.5–8.1)

## 2019-09-27 LAB — URINALYSIS, COMPLETE (UACMP) WITH MICROSCOPIC
Bacteria, UA: NONE SEEN
Bilirubin Urine: NEGATIVE
Glucose, UA: NEGATIVE mg/dL
Ketones, ur: NEGATIVE mg/dL
Leukocytes,Ua: NEGATIVE
Nitrite: NEGATIVE
Protein, ur: NEGATIVE mg/dL
Specific Gravity, Urine: 1.015 (ref 1.005–1.030)
Squamous Epithelial / HPF: NONE SEEN (ref 0–5)
pH: 5 (ref 5.0–8.0)

## 2019-09-27 LAB — CBC
HCT: 41.8 % (ref 39.0–52.0)
Hemoglobin: 14.8 g/dL (ref 13.0–17.0)
MCH: 30.4 pg (ref 26.0–34.0)
MCHC: 35.4 g/dL (ref 30.0–36.0)
MCV: 85.8 fL (ref 80.0–100.0)
Platelets: 204 10*3/uL (ref 150–400)
RBC: 4.87 MIL/uL (ref 4.22–5.81)
RDW: 12.3 % (ref 11.5–15.5)
WBC: 13.8 10*3/uL — ABNORMAL HIGH (ref 4.0–10.5)
nRBC: 0 % (ref 0.0–0.2)

## 2019-09-27 LAB — LIPASE, BLOOD: Lipase: 31 U/L (ref 11–51)

## 2019-09-27 MED ORDER — ONDANSETRON HCL 4 MG/2ML IJ SOLN
4.0000 mg | Freq: Once | INTRAMUSCULAR | Status: AC
  Administered 2019-09-27: 4 mg via INTRAVENOUS
  Filled 2019-09-27: qty 2

## 2019-09-27 MED ORDER — KETOROLAC TROMETHAMINE 30 MG/ML IJ SOLN
30.0000 mg | Freq: Once | INTRAMUSCULAR | Status: AC
  Administered 2019-09-27: 30 mg via INTRAVENOUS
  Filled 2019-09-27: qty 1

## 2019-09-27 MED ORDER — HYDROMORPHONE HCL 1 MG/ML IJ SOLN
0.5000 mg | Freq: Once | INTRAMUSCULAR | Status: AC
  Administered 2019-09-27: 0.5 mg via INTRAVENOUS
  Filled 2019-09-27: qty 1

## 2019-09-27 MED ORDER — KETOROLAC TROMETHAMINE 10 MG PO TABS: 10.0000 mg | ORAL_TABLET | Freq: Four times a day (QID) | ORAL | 0 refills | Status: DC | PRN

## 2019-09-27 MED ORDER — TAMSULOSIN HCL 0.4 MG PO CAPS: 0.4000 mg | ORAL_CAPSULE | Freq: Every day | ORAL | 0 refills | Status: DC

## 2019-09-27 NOTE — Telephone Encounter (Signed)
On 09/25/19 started with pain in lt side and on 09/26/19 lt side pain increased; pt had BM and then felt fine. After dinner last night pt began to have severe dull pain again in lt side just above the belt line. Pain is now continuous and does not radiate. Pain level is 7 now. Pt vomited x 2 last night and x1 this morning. This is most painful thing pt has every had.pt will go to [facility] ED. No other covid symptoms except severe abd pain and vomiting. FYI to Dr Darnell Level.

## 2019-09-27 NOTE — ED Notes (Signed)
Patient transported to CT 

## 2019-09-27 NOTE — Telephone Encounter (Signed)
Per chart review tab pt is at [facility] ED and Dr Danise Mina has already noted pts symptoms.

## 2019-09-27 NOTE — Discharge Instructions (Signed)
Please use the Toradol 1 pill 4 times a day as needed for food.  Use the Flomax 1 a day to help relax the tube from the kidney to the bladder let the stone pass more easily.  Please return for increased pain, fever or vomiting.  Please follow-up with urologist.  Give their office a [place] when you get home and arrange a follow-up appointment in a week or 2.  It is important to make sure the stone has passed completely.  You can strain all your urine and try and catch the stone and take it with you. If you need Motrin afterwards you can take up to 3 of the over-the-counter pills 3 times a day but not for more than a few days because it also can cause stomach ulcers or kidney trouble if used for prolonged periods.

## 2019-09-27 NOTE — Telephone Encounter (Signed)
[facility] Night - Client Nonclinical Telephone Record AccessNurse Client [place] [facility] - Client Client Site [place] Physician Ria Bush - MD Contact Type [place] Who Is Calling Patient / Member / Family / Caregiver Caller Name [facility] [place] Caller Phone Number [phone] Patient Name Steve Lynch Patient DOB 05-14-1956 [place] Type Message Only Information Provided Reason for [place] Request to Schedule Office Appointment Initial Comment Caller states he needs an appt. ASAP. He states he has with left sided stomach pain. Additional Comment Declines triage. Disp. Time Disposition Final User 09/27/2019 7:26:02 AM General Information Provided Yes [name] Closed By: Scharlene Corn Transaction Date/Time: 09/27/2019 7:23:52 AM (ET)

## 2019-09-27 NOTE — ED Provider Notes (Signed)
[facility] Emergency Department Provider Note   ____________________________________________   First MD Initiated Contact with Patient 09/27/19 1003     (approximate)  I have reviewed the triage vital signs and the nursing notes.   HISTORY  Chief Complaint Abdominal Pain   HPI Steve Lynch is a 63 y.o. male he reports he had left lower quadrant pain a couple days ago got better after stooling.  Pain returned today.  Is worse with palpation percussion but not when he hits the bumps in the road or moves.  He has not had any stool today just gas.  He does not have any fever or CVA tenderness.  He is not vomiting or having diarrhea.  He does have some hematuria.         Past Medical History:  Diagnosis Date  . Drug abuse in remission (HCC) 1990s   Cocaine with rehab  . Dyslipidemia    mild  . Peripheral vascular disease (HCC) 2004   varicose veins right leg  . Tendonitis 10/2010   temporal tendonitis with locked jaw following tooth extraction  . Vitamin B12 deficiency 09/2013    Patient Active Problem List   Diagnosis Date Noted  . Left leg pain 06/07/2019  . Acute bacterial bronchitis 08/03/2018  . Rib sprain, subsequent encounter 08/03/2018  . Left shoulder pain 08/03/2018  . Superficial thrombophlebitis of right leg 06/24/2018  . MDD (major depressive disorder), single episode, moderate (HCC) 04/10/2018  . Fracture of tibia with fibula, closed, right, sequela 10/16/2017  . Crushing injury of left thumb 09/15/2017  . Mood disorder (HCC) 04/02/2017  . OSA on CPAP 08/13/2016  . Severe obesity (BMI >= 40) (HCC) 08/13/2016  . BPH associated with nocturia 08/13/2016  . Pedal edema 08/13/2016  . Dyslipidemia   . Healthcare maintenance 10/01/2013  . Vitamin B12 deficiency 09/13/2013  . Cough 08/16/2013  . Fatigue 08/16/2013    Past Surgical History:  Procedure Laterality Date  . BACK SURGERY  2005   "DISC  REPLACEMENT"   L5-6   .  COLONOSCOPY  12/2011   TAx1, diverticulosis, rpt 5 yrs (Dr [name] @ [facility])  . KNEE ARTHROPLASTY Left 2012  . TONSILLECTOMY    . TOTAL KNEE ARTHROPLASTY  11/12/2011   Procedure: TOTAL KNEE ARTHROPLASTY;  Surgeon: Shelda PalMatthew D Olin, MD; Laterality: Right  . TOTAL KNEE ARTHROPLASTY Bilateral   . VARICOSE VEIN SURGERY Right    sclerotherapy    Prior to Admission medications   Medication Sig Start Date End Date Taking? Authorizing Provider  ketorolac (TORADOL) 10 MG tablet Take 1 tablet (10 mg total) by mouth every 6 (six) hours as needed. 09/27/19   Arnaldo NatalMalinda, Anquinette Pierro F, MD  tamsulosin (FLOMAX) 0.4 MG CAPS capsule Take 1 capsule (0.4 mg total) by mouth daily. 09/27/19   Arnaldo NatalMalinda, Debroh Sieloff F, MD    Allergies Oxycodone  Family History  Problem Relation Age of Onset  . Hyperlipidemia Mother   . Hypertension Mother   . Diabetes Mother   . ALS Sister   . Cancer Neg Hx   . CAD Neg Hx   . Stroke Neg Hx     Social History Social History   Tobacco Use  . Smoking status: Never Smoker  . Smokeless tobacco: Never Used  Substance Use Topics  . Alcohol use: Yes    Comment: Very rare  . Drug use: No    Comment: COCAINE ADDICTION WITH REHAB   21 YEARS AGO  Review of Systems  Constitutional: No fever/chills Eyes: No visual changes. ENT: No sore throat. Cardiovascular: Denies chest pain. Respiratory: Denies shortness of breath. Gastrointestinal:  abdominal pain.  No nausea, no vomiting.  No diarrhea.  No constipation. Genitourinary: Negative for dysuria. Musculoskeletal: Negative for back pain. Skin: Negative for rash. Neurological: Negative for headaches, focal weakness  ____________________________________________   PHYSICAL EXAM:  VITAL SIGNS: ED Triage Vitals  Enc Vitals Group     BP 09/27/19 0902 (!) 154/90     Pulse Rate 09/27/19 0902 80     Resp 09/27/19 0902 20     Temp 09/27/19 0902 99 F (37.2 C)     Temp Source 09/27/19 0902 Oral     SpO2 09/27/19 0902  100 %     Weight 09/27/19 0901 (!) 325 lb (147.4 kg)     Height 09/27/19 0901 6\' 5"  (1.956 m)     Head Circumference --      Peak Flow --      Pain Score 09/27/19 0900 7     Pain Loc --      Pain Edu? --      Excl. in GC? --     Constitutional: Alert and oriented. Well appearing and in no acute distress. Eyes: Conjunctivae are normal. Head: Atraumatic. Nose: No congestion/rhinnorhea. Mouth/Throat: Mucous membranes are moist.  Oropharynx non-erythematous. Neck: No stridor.  Cardiovascular: Normal rate, regular rhythm. Grossly normal heart sounds.  Good peripheral circulation. Respiratory: Normal respiratory effort.  No retractions. Lungs CTAB. Gastrointestinal: Soft and nontender except for in the left lower quadrant where it is tender to palpation and slightly tender to percussion. No distention. No abdominal bruits. No CVA tenderness. Musculoskeletal: No lower extremity tenderness nor edema.  No joint effusions. Neurologic:  Normal speech and language. No gross focal neurologic deficits are appreciated.  Skin:  Skin is warm, dry and intact. No rash noted.   ____________________________________________   LABS (all labs ordered are listed, but only abnormal results are displayed)  Labs Reviewed  COMPREHENSIVE METABOLIC PANEL - Abnormal; Notable for the following components:      Result Value   Glucose, Bld 136 (*)    BUN 25 (*)    Creatinine, Ser 1.51 (*)    Total Protein 8.2 (*)    GFR calc non Af Amer 49 (*)    GFR calc Af Amer 57 (*)    All other components within normal limits  CBC - Abnormal; Notable for the following components:   WBC 13.8 (*)    All other components within normal limits  URINALYSIS, COMPLETE (UACMP) WITH MICROSCOPIC - Abnormal; Notable for the following components:   Color, Urine YELLOW (*)    APPearance CLEAR (*)    Hgb urine dipstick LARGE (*)    All other components within normal limits  LIPASE, BLOOD    ____________________________________________  EKG   ____________________________________________  RADIOLOGY  ED MD interpretation:    Official radiology report(s): CT Renal Stone Study  Result Date: 09/27/2019 CLINICAL DATA:  Left flank pain EXAM: CT ABDOMEN AND PELVIS WITHOUT CONTRAST TECHNIQUE: Multidetector CT imaging of the abdomen and pelvis was performed following the standard protocol without IV contrast. COMPARISON:  None. FINDINGS: Lower chest: No acute abnormality. Hepatobiliary: No focal liver abnormality is seen. No gallstones, gallbladder wall thickening, or biliary dilatation. Pancreas: Unremarkable. Spleen: Unremarkable. Adrenals/Urinary Tract: There is a 2 mm calculus within the distal left ureter just above the ureterovesical junction. There is mild proximal hydroureter. Mild prominence of  the left renal pelvis. There is left periureteral and left perinephric fat infiltration. Right kidney is unremarkable. Partially distended bladder is unremarkable. Adrenals are normal. Stomach/Bowel: Small hiatal hernia. Descending and sigmoid colonic diverticulosis. Normal appendix. Vascular/Lymphatic: Mild aortic atherosclerosis. No enlarged lymph nodes. Reproductive: Prostate is unremarkable. Other: Abdominal wall is unremarkable. Musculoskeletal: Degenerative changes of the included spine. IMPRESSION: Obstructing 2 mm calculus within the distal left ureter just above the ureterovesical junction. Mild proximal hydroureter and mild prominence of the left renal pelvis. Colonic diverticulosis. Small hiatal hernia. Electronically Signed   By: Macy Mis M.D.   On: 09/27/2019 10:57    ____________________________________________   PROCEDURES  Procedure(s) performed (including Critical Care):  Procedures   ____________________________________________   INITIAL IMPRESSION / ASSESSMENT AND PLAN / ED COURSE  Steve Lynch was evaluated in Emergency Department on 09/27/2019 for the  symptoms described in the history of present illness. He was evaluated in the context of the global COVID-19 pandemic, which necessitated consideration that the patient might be at risk for infection with the SARS-CoV-2 virus that causes COVID-19. Institutional protocols and algorithms that pertain to the evaluation of patients at risk for COVID-19 are in a state of rapid change based on information released by regulatory bodies including the CDC and federal and state organizations. These policies and algorithms were followed during the patient's care in the ED.    Patient's pain is gone after the Toradol.  He has not to get any Percocet or other narcotic.  I will give him a couple days of Toradol p.o. and then he can try some Motrin I discussed this with him he needs to take the Motrin with food as well.  Will follow up with urology.  He will return if he is worse.         ____________________________________________   FINAL CLINICAL IMPRESSION(S) / ED DIAGNOSES  Final diagnoses:  Renal colic     ED Discharge Orders         Ordered    ketorolac (TORADOL) 10 MG tablet  Every 6 hours PRN     09/27/19 1227    tamsulosin (FLOMAX) 0.4 MG CAPS capsule  Daily     09/27/19 1233           Note:  This document was prepared using Dragon voice recognition software and may include unintentional dictation errors.    Nena Polio, MD 09/27/19 [phone]

## 2019-09-27 NOTE — ED Notes (Signed)
AAOx3.  Skin warm and dry.  NAD 

## 2019-09-27 NOTE — Telephone Encounter (Signed)
Noted. Agree.

## 2019-09-27 NOTE — ED Notes (Signed)
Return from CT scan.  AAOx3  Skin warm and dry. NAD 

## 2019-09-27 NOTE — ED Triage Notes (Signed)
Pt reports pain to LLQ for a couple of days. He thought it was constipation and then he had a BM on Sunday and it felt better. Pt state last pm at 8:30 the pain came back. Pt reports has vomited as well. Denies fevers, urinary sx's or other sx's.

## 2019-10-20 ENCOUNTER — Ambulatory Visit: Payer: Self-pay | Admitting: Urology

## 2019-11-10 ENCOUNTER — Ambulatory Visit: Payer: No Typology Code available for payment source | Attending: Internal Medicine

## 2019-11-10 DIAGNOSIS — Z20822 Contact with and (suspected) exposure to covid-19: Secondary | ICD-10-CM

## 2019-11-11 LAB — NOVEL CORONAVIRUS, NAA: SARS-CoV-2, NAA: NOT DETECTED

## 2019-11-24 ENCOUNTER — Ambulatory Visit: Payer: Self-pay

## 2020-01-12 ENCOUNTER — Other Ambulatory Visit (INDEPENDENT_AMBULATORY_CARE_PROVIDER_SITE_OTHER): Payer: 59

## 2020-01-12 ENCOUNTER — Other Ambulatory Visit: Payer: Self-pay | Admitting: Family Medicine

## 2020-01-12 ENCOUNTER — Other Ambulatory Visit: Payer: Self-pay

## 2020-01-12 DIAGNOSIS — E538 Deficiency of other specified B group vitamins: Secondary | ICD-10-CM | POA: Diagnosis not present

## 2020-01-12 DIAGNOSIS — N401 Enlarged prostate with lower urinary tract symptoms: Secondary | ICD-10-CM

## 2020-01-12 DIAGNOSIS — E785 Hyperlipidemia, unspecified: Secondary | ICD-10-CM

## 2020-01-12 DIAGNOSIS — R351 Nocturia: Secondary | ICD-10-CM

## 2020-01-12 LAB — VITAMIN B12: Vitamin B-12: 177 pg/mL — ABNORMAL LOW (ref 211–911)

## 2020-01-12 LAB — TSH: TSH: 2.27 u[IU]/mL (ref 0.35–4.50)

## 2020-01-12 LAB — COMPREHENSIVE METABOLIC PANEL
ALT: 13 U/L (ref 0–53)
AST: 17 U/L (ref 0–37)
Albumin: 3.7 g/dL (ref 3.5–5.2)
Alkaline Phosphatase: 83 U/L (ref 39–117)
BUN: 21 mg/dL (ref 6–23)
CO2: 26 mEq/L (ref 19–32)
Calcium: 8.6 mg/dL (ref 8.4–10.5)
Chloride: 107 mEq/L (ref 96–112)
Creatinine, Ser: 0.9 mg/dL (ref 0.40–1.50)
GFR: 85.44 mL/min (ref >60.00)
Glucose, Bld: 92 mg/dL (ref 70–99)
Potassium: 4 mEq/L (ref 3.5–5.1)
Sodium: 141 mEq/L (ref 135–145)
Total Bilirubin: 0.6 mg/dL (ref 0.2–1.2)
Total Protein: 6.5 g/dL (ref 6.0–8.3)

## 2020-01-12 LAB — LIPID PANEL
Cholesterol: 126 mg/dL (ref 0–200)
HDL: 28.5 mg/dL — ABNORMAL LOW (ref >39.00)
LDL Cholesterol: 84 mg/dL (ref 0–99)
NonHDL: 97.23
Total CHOL/HDL Ratio: 4
Triglycerides: 67 mg/dL (ref 0.0–149.0)
VLDL: 13.4 mg/dL (ref 0.0–40.0)

## 2020-01-12 LAB — PSA: PSA: 1.58 ng/mL (ref 0.10–4.00)

## 2020-01-19 ENCOUNTER — Ambulatory Visit (INDEPENDENT_AMBULATORY_CARE_PROVIDER_SITE_OTHER): Payer: 59 | Admitting: Family Medicine

## 2020-01-19 ENCOUNTER — Other Ambulatory Visit: Payer: Self-pay

## 2020-01-19 ENCOUNTER — Encounter: Payer: Self-pay | Admitting: Family Medicine

## 2020-01-19 VITALS — BP 136/82 | HR 81 | Temp 97.8°F | Ht 75.5 in | Wt 330.1 lb

## 2020-01-19 DIAGNOSIS — F321 Major depressive disorder, single episode, moderate: Secondary | ICD-10-CM | POA: Diagnosis not present

## 2020-01-19 DIAGNOSIS — L304 Erythema intertrigo: Secondary | ICD-10-CM

## 2020-01-19 DIAGNOSIS — Z9989 Dependence on other enabling machines and devices: Secondary | ICD-10-CM

## 2020-01-19 DIAGNOSIS — R202 Paresthesia of skin: Secondary | ICD-10-CM | POA: Diagnosis not present

## 2020-01-19 DIAGNOSIS — G250 Essential tremor: Secondary | ICD-10-CM

## 2020-01-19 DIAGNOSIS — E538 Deficiency of other specified B group vitamins: Secondary | ICD-10-CM | POA: Diagnosis not present

## 2020-01-19 DIAGNOSIS — R351 Nocturia: Secondary | ICD-10-CM

## 2020-01-19 DIAGNOSIS — N401 Enlarged prostate with lower urinary tract symptoms: Secondary | ICD-10-CM

## 2020-01-19 DIAGNOSIS — H1132 Conjunctival hemorrhage, left eye: Secondary | ICD-10-CM | POA: Diagnosis not present

## 2020-01-19 DIAGNOSIS — E785 Hyperlipidemia, unspecified: Secondary | ICD-10-CM

## 2020-01-19 DIAGNOSIS — R600 Localized edema: Secondary | ICD-10-CM

## 2020-01-19 DIAGNOSIS — Z0000 Encounter for general adult medical examination without abnormal findings: Secondary | ICD-10-CM | POA: Diagnosis not present

## 2020-01-19 DIAGNOSIS — G4733 Obstructive sleep apnea (adult) (pediatric): Secondary | ICD-10-CM

## 2020-01-19 MED ORDER — B-12 1000 MCG SL SUBL: 1.0000 | SUBLINGUAL_TABLET | Freq: Every day | SUBLINGUAL | Status: DC

## 2020-01-19 MED ORDER — VITAMIN D3 25 MCG (1000 UT) PO CAPS: 1.0000 | ORAL_CAPSULE | Freq: Every day | ORAL | Status: DC

## 2020-01-19 MED ORDER — CYANOCOBALAMIN 1000 MCG/ML IJ SOLN
1000.0000 ug | Freq: Once | INTRAMUSCULAR | Status: AC
  Administered 2020-01-19: 1000 ug via INTRAMUSCULAR

## 2020-01-19 MED ORDER — HYDROXYZINE HCL 25 MG PO TABS: 25.0000 mg | ORAL_TABLET | Freq: Two times a day (BID) | ORAL | 1 refills | Status: DC | PRN
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
## 2020-01-19 NOTE — Patient Instructions (Addendum)
B12 shot today We will request records of latest colonoscopy from Dr Lelon Huh ([place]).  Try hydroxyzine 25mg  for itching or sleep.  Try compression stockings. Let us know if you'd like referral to vascular surgeon.  For groin rash - use clotrimazole (lotrimin) twice daily for 4 weeks.  Start oral b12 1060mcg daily - dissolvable tablets. Recheck levels in 3-6 months (lab visit only). Return in 6 months for follow up visit.   Health Maintenance, Male Adopting a healthy lifestyle and getting preventive care are important in promoting health and wellness. Ask your health care provider about:  The right schedule for you to have regular tests and exams.  Things you can do on your own to prevent diseases and keep yourself healthy. What should I know about diet, weight, and exercise? Eat a healthy diet   Eat a diet that includes plenty of vegetables, fruits, low-fat dairy products, and lean protein.  Do not eat a lot of foods that are high in solid fats, added sugars, or sodium. Maintain a healthy weight Body mass index (BMI) is a measurement that can be used to identify possible weight problems. It estimates body fat based on height and weight. Your health care provider can help determine your BMI and help you achieve or maintain a healthy weight. Get regular exercise Get regular exercise. This is one of the most important things you can do for your health. Most adults should:  Exercise for at least 150 minutes each week. The exercise should increase your heart rate and make you sweat (moderate-intensity exercise).  Do strengthening exercises at least twice a week. This is in addition to the moderate-intensity exercise.  Spend less time sitting. Even light physical activity can be beneficial. Watch cholesterol and blood lipids Have your blood tested for lipids and cholesterol at 62 years of age, then have this test every 5 years. You may need to have your cholesterol  levels checked more often if:  Your lipid or cholesterol levels are high.  You are older than 62 years of age.  You are at high risk for heart disease. What should I know about cancer screening? Many types of cancers can be detected early and may often be prevented. Depending on your health history and family history, you may need to have cancer screening at various ages. This may include screening for:  Colorectal cancer.  Prostate cancer.  Skin cancer.  Lung cancer. What should I know about heart disease, diabetes, and high blood pressure? Blood pressure and heart disease  High blood pressure causes heart disease and increases the risk of stroke. This is more likely to develop in people who have high blood pressure readings, are of African descent, or are overweight.  Talk with your health care provider about your target blood pressure readings.  Have your blood pressure checked: ? Every 3-5 years if you are 70-38 years of age. ? Every year if you are 26 years old or older.  If you are between the ages of 47 and 3 and are a current or former smoker, ask your health care provider if you should have a one-time screening for abdominal aortic aneurysm (AAA). Diabetes Have regular diabetes screenings. This checks your fasting blood sugar level. Have the screening done:  Once every three years after age 63 if you are at a normal weight and have a low risk for diabetes.  More often and at a younger age if you are overweight or have a high risk  for diabetes. What should I know about preventing infection? Hepatitis B If you have a higher risk for hepatitis B, you should be screened for this virus. Talk with your health care provider to find out if you are at risk for hepatitis B infection. Hepatitis C Blood testing is recommended for:  Everyone born from [birth year] through 1963.  Anyone with known risk factors for hepatitis C. Sexually transmitted infections (STIs)  You should be  screened each year for STIs, including gonorrhea and chlamydia, if: ? You are sexually active and are younger than 62 years of age. ? You are older than 62 years of age and your health care provider tells you that you are at risk for this type of infection. ? Your sexual activity has changed since you were last screened, and you are at increased risk for chlamydia or gonorrhea. Ask your health care provider if you are at risk.  Ask your health care provider about whether you are at high risk for HIV. Your health care provider may recommend a prescription medicine to help prevent HIV infection. If you choose to take medicine to prevent HIV, you should first get tested for HIV. You should then be tested every 3 months for as long as you are taking the medicine. Follow these instructions at home: Lifestyle  Do not use any products that contain nicotine or tobacco, such as cigarettes, e-cigarettes, and chewing tobacco. If you need help quitting, ask your health care provider.  Do not use street drugs.  Do not share needles.  Ask your health care provider for help if you need support or information about quitting drugs. Alcohol use  Do not drink alcohol if your health care provider tells you not to drink.  If you drink alcohol: ? Limit how much you have to 0-2 drinks a day. ? Be aware of how much alcohol is in your drink. In the [place] one drink equals one 12 oz bottle of beer (355 mL), one 5 oz glass of wine (148 mL), or one 1 oz glass of hard liquor (44 mL). General instructions  Schedule regular health, dental, and eye exams.  Stay current with your vaccines.  Tell your health care provider if: ? You often feel depressed. ? You have ever been abused or do not feel safe at home. Summary  Adopting a healthy lifestyle and getting preventive care are important in promoting health and wellness.  Follow your health care provider's instructions about healthy diet, exercising, and getting  tested or screened for diseases.  Follow your health care provider's instructions on monitoring your cholesterol and blood pressure. This information is not intended to replace advice given to you by your health care provider. Make sure you discuss any questions you have with your health care provider. Document Revised: 09/23/2018 Document Reviewed: 09/23/2018 Elsevier Patient Education  2020 [employer].

## 2020-01-19 NOTE — Assessment & Plan Note (Signed)
Preventative protocols reviewed and updated unless pt declined. Discussed healthy diet and lifestyle.  

## 2020-01-22 ENCOUNTER — Encounter: Payer: Self-pay | Admitting: Family Medicine

## 2020-01-22 DIAGNOSIS — L304 Erythema intertrigo: Secondary | ICD-10-CM | POA: Insufficient documentation

## 2020-01-22 DIAGNOSIS — R202 Paresthesia of skin: Secondary | ICD-10-CM | POA: Insufficient documentation

## 2020-01-22 DIAGNOSIS — H1132 Conjunctival hemorrhage, left eye: Secondary | ICD-10-CM | POA: Insufficient documentation

## 2020-01-22 NOTE — Assessment & Plan Note (Signed)
Anticipate tinea cruris - rec clotrimazole cream BID x 4 wks.
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
## 2020-01-22 NOTE — Assessment & Plan Note (Signed)
Reassurance. Anticipate will continue to heal. Advised check BP if recurrent.

## 2020-01-22 NOTE — Assessment & Plan Note (Signed)
L>R hands. Discussed possible medication for this. Reassess next visit.  Previously saw neurology (10/2017).

## 2020-01-22 NOTE — Assessment & Plan Note (Addendum)
Did not tolerate CPAP machine.  Will encourage ongoing weight loss.

## 2020-01-22 NOTE — Assessment & Plan Note (Signed)
Anticipate vit B12 deficiency related. See above.

## 2020-01-22 NOTE — Assessment & Plan Note (Addendum)
Persistent R leg edema after traumatic tib/fib fracture. Discussed compression stocking use.  Discussed possible return to vein surgeon (in h/o sclerotherapy to RLE)

## 2020-01-22 NOTE — Assessment & Plan Note (Signed)
Encouraged ongoing weight loss efforts through healthy diet and lifestyle choices.

## 2020-01-22 NOTE — Assessment & Plan Note (Addendum)
Overall asymptomatic period.

## 2020-01-22 NOTE — Assessment & Plan Note (Signed)
Low HDL - encouraged aerobic exercise /walking  The ASCVD Risk score Denman George DC Jr., et al., 2013) failed to calculate for the following reasons:   The valid total cholesterol range is 130 to 320 mg/dL

## 2020-01-22 NOTE — Assessment & Plan Note (Signed)
rec restart oral sublingual b12 given markedly low levels which could contribute to paresthesias, energy.  IM B12 shot today

## 2020-01-24 ENCOUNTER — Encounter: Payer: Self-pay | Admitting: Family Medicine

## 2020-05-18 ENCOUNTER — Other Ambulatory Visit: Payer: Self-pay | Admitting: Family Medicine

## 2020-05-18 DIAGNOSIS — E785 Hyperlipidemia, unspecified: Secondary | ICD-10-CM

## 2020-05-18 DIAGNOSIS — E538 Deficiency of other specified B group vitamins: Secondary | ICD-10-CM

## 2020-05-19 ENCOUNTER — Other Ambulatory Visit: Payer: 59

## 2020-05-19 ENCOUNTER — Other Ambulatory Visit: Payer: Self-pay

## 2020-05-19 ENCOUNTER — Other Ambulatory Visit (INDEPENDENT_AMBULATORY_CARE_PROVIDER_SITE_OTHER): Payer: No Typology Code available for payment source

## 2020-05-19 DIAGNOSIS — E785 Hyperlipidemia, unspecified: Secondary | ICD-10-CM | POA: Diagnosis not present

## 2020-05-19 DIAGNOSIS — E538 Deficiency of other specified B group vitamins: Secondary | ICD-10-CM

## 2020-05-19 NOTE — Addendum Note (Signed)
Addended by: Shon Millet on: 05/19/2020 04:03 PM   Modules accepted: Orders

## 2020-05-20 LAB — BASIC METABOLIC PANEL
BUN/Creatinine Ratio: 26 (calc) — ABNORMAL HIGH (ref 6–22)
BUN: 28 mg/dL — ABNORMAL HIGH (ref 7–25)
CO2: 19 mmol/L — ABNORMAL LOW (ref 20–32)
Calcium: 8.7 mg/dL (ref 8.6–10.3)
Chloride: 111 mmol/L — ABNORMAL HIGH (ref 98–110)
Creat: 1.06 mg/dL (ref 0.70–1.25)
Glucose, Bld: 83 mg/dL (ref 65–99)
Potassium: 4.3 mmol/L (ref 3.5–5.3)
Sodium: 143 mmol/L (ref 135–146)

## 2020-05-20 LAB — LIPID PANEL
Cholesterol: 165 mg/dL (ref <200)
HDL: 32 mg/dL — ABNORMAL LOW (ref >=40)
LDL Cholesterol (Calc): 103 mg/dL (calc) — ABNORMAL HIGH
Non-HDL Cholesterol (Calc): 133 mg/dL (calc) — ABNORMAL HIGH (ref <130)
Total CHOL/HDL Ratio: 5.2 (calc) — ABNORMAL HIGH (ref <5.0)
Triglycerides: 186 mg/dL — ABNORMAL HIGH (ref <150)

## 2020-05-20 LAB — VITAMIN B12: Vitamin B-12: 404 pg/mL (ref 200–1100)

## 2020-07-10 ENCOUNTER — Encounter: Payer: Self-pay | Admitting: Family Medicine

## 2020-07-10 ENCOUNTER — Telehealth: Payer: Self-pay

## 2020-07-10 ENCOUNTER — Other Ambulatory Visit: Payer: Self-pay

## 2020-07-10 ENCOUNTER — Ambulatory Visit: Payer: No Typology Code available for payment source | Admitting: Family Medicine

## 2020-07-10 VITALS — BP 120/78 | HR 95 | Temp 98.0°F | Ht 75.5 in | Wt 347.5 lb

## 2020-07-10 DIAGNOSIS — G4733 Obstructive sleep apnea (adult) (pediatric): Secondary | ICD-10-CM | POA: Diagnosis not present

## 2020-07-10 DIAGNOSIS — R053 Chronic cough: Secondary | ICD-10-CM

## 2020-07-10 DIAGNOSIS — R05 Cough: Secondary | ICD-10-CM

## 2020-07-10 DIAGNOSIS — E538 Deficiency of other specified B group vitamins: Secondary | ICD-10-CM

## 2020-07-10 DIAGNOSIS — M79672 Pain in left foot: Secondary | ICD-10-CM | POA: Diagnosis not present

## 2020-07-10 DIAGNOSIS — G8929 Other chronic pain: Secondary | ICD-10-CM

## 2020-07-10 DIAGNOSIS — Z9989 Dependence on other enabling machines and devices: Secondary | ICD-10-CM

## 2020-07-10 MED ORDER — OMEPRAZOLE 40 MG PO CPDR: 40.0000 mg | DELAYED_RELEASE_CAPSULE | Freq: Every day | ORAL | 3 refills | Status: DC

## 2020-07-10 MED ORDER — CONTRAVE 8-90 MG PO TB12: ORAL_TABLET | ORAL | 1 refills | Status: DC

## 2020-07-10 MED ORDER — VITAMIN D3 25 MCG (1000 UT) PO CAPS: 1.0000 | ORAL_CAPSULE | Freq: Every day | ORAL | Status: DC

## 2020-07-10 MED ORDER — B-12 1000 MCG SL SUBL: 1.0000 | SUBLINGUAL_TABLET | Freq: Every day | SUBLINGUAL | Status: DC

## 2020-07-10 NOTE — Telephone Encounter (Signed)
Pt left v/m that he went to pharmacy to pick up his medication and the cost was astronomical and pt request prior authorization be done so ins will cover medication. Pt request cb.

## 2020-07-10 NOTE — Assessment & Plan Note (Signed)
Intermittent with oral b12 - encouraged daily use.

## 2020-07-10 NOTE — Assessment & Plan Note (Signed)
Anticipate silent reflux. Prior cough improved with PPI (2019). Will restart omeprazole 40mg  daily for 3 wks then PRN.

## 2020-07-10 NOTE — Patient Instructions (Addendum)
For ongoing plantar fasciitis of left heel - regular stretching, frozen water bottle massages, may continue ibuprofen as needed (trying to minimize), gel insert to left heel.  For weight loss - work on mindfulness and more deliberate eating.  Price out [employer]. Start 1 tab daily for 1 week then increase to twice daily for 1 week then 2 in am and 1 in pm from then on.  Return as needed or in 1 month for follow up after starting contrave.  Return in 6 months for physical.

## 2020-07-10 NOTE — Progress Notes (Signed)
This visit was conducted in person.  BP 120/78 (BP Location: Left Arm, Patient Position: Sitting, Cuff Size: Large)   Pulse 95   Temp 98 F (36.7 C) (Temporal)   Ht 6' 3.5" (1.918 m)   Wt (!) 347 lb 8 oz (157.6 kg)   SpO2 96%   BMI 42.86 kg/m    CC: 6 mo f/u visit  Subjective:    Patient ID: Steve Lynch, male    DOB: 07/12/1956, 64 y.o.   MRN: [id number]  HPI: Steve Lynch is a 64 y.o. male presenting on 07/10/2020 for Follow-up (Here for 6 mo f/u.)   Chronic L heel pain for 6 months s/p shots into heel with temporary relief. Has seen chiropractor and podiatry - plantar fasciitis. Finds ibuprofen 600mg  bid helps. Currently taking 600mg  once daily. Ankle braces haven't helped.   Ongoing weight gain - 20+ lbs sine 09/2019. No regular exercise, weight gain noted. Struggling with "comfort" eating at night time - munching on cookies at night. No significant hunger trouble. No seizure hx. Endorses h/o intermittent depressed mood.   OSA - previously unable to tolerate CPAP. Planning to retrial. Considering dental appliance. Requests new referral back to sleep medicine.   Vit B12 def - taking B12 replacement daily. Didn't note improvement in energy.   Ongoing chronic cough for months - worse with weather changes or at night time. Wakes up with congestion in throat that causes coughing fit. Notes intermittent heartburn as well.   Tremor - longstanding, previously saw neurology 10/2017 ([place]).      Relevant past medical, surgical, family and social history reviewed and updated as indicated. Interim medical history since our last visit reviewed. Allergies and medications reviewed and updated. Outpatient Medications Prior to Visit  Medication Sig Dispense Refill  . Cholecalciferol (VITAMIN D3) 25 MCG (1000 UT) CAPS Take 1 capsule (1,000 Units total) by mouth daily. 30 capsule   . Cyanocobalamin (B-12) 1000 MCG SUBL Place 1 tablet under the tongue daily.    . hydrOXYzine (ATARAX/VISTARIL)  25 MG tablet Take 1 tablet (25 mg total) by mouth 2 (two) times daily as needed for anxiety or itching (sedation precautions). 40 tablet 1   No facility-administered medications prior to visit.     Per HPI unless specifically indicated in ROS section below Review of Systems Objective:  BP 120/78 (BP Location: Left Arm, Patient Position: Sitting, Cuff Size: Large)   Pulse 95   Temp 98 F (36.7 C) (Temporal)   Ht 6' 3.5" (1.918 m)   Wt (!) 347 lb 8 oz (157.6 kg)   SpO2 96%   BMI 42.86 kg/m   Wt Readings from Last 3 Encounters:  07/10/20 (!) 347 lb 8 oz (157.6 kg)  01/19/20 (!) 330 lb 1 oz (149.7 kg)  09/27/19 (!) 325 lb (147.4 kg)      Physical Exam Vitals and nursing note reviewed.  Constitutional:      Appearance: Normal appearance. He is obese. He is not ill-appearing.  Cardiovascular:     Rate and Rhythm: Normal rate and regular rhythm.     Pulses: Normal pulses.     Heart sounds: Normal heart sounds. No murmur heard.   Pulmonary:     Effort: Pulmonary effort is normal. No respiratory distress.     Breath sounds: Normal breath sounds. No wheezing, rhonchi or rales.  Musculoskeletal:     Right lower leg: Edema (tr) present.     Left lower leg: Edema (tr) present.  Skin:  General: Skin is warm and dry.     Findings: No rash.  Neurological:     Mental Status: He is alert.  Psychiatric:        Mood and Affect: Mood normal.        Behavior: Behavior normal.       Assessment & Plan:  This visit occurred during the SARS-CoV-2 public health emergency.  Safety protocols were in place, including screening questions prior to the visit, additional usage of staff PPE, and extensive cleaning of exam room while observing appropriate contact time as indicated for disinfecting solutions.   Problem List Items Addressed This Visit    Vitamin B12 deficiency    Intermittent with oral b12 - encouraged daily use.       Severe obesity (BMI >= 40) (HCC)    Reviewed weight gain  noted, encouraged renewing efforts to follow healthy diet and lifestyle changes to affect sustainable weight loss.  Interested in bariatric medication. Reviewed options. Notes predominant trouble at night - enjoys snacking on junk food. Discussed mindfulness/deliberate eating. Discussed Contrave as option to curb cravings. Reviewed combination medication, side effects to watch for. Start taper over the next month then RTC 1 mo weight f/u.       Relevant Medications   Naltrexone-buPROPion HCl ER (CONTRAVE) 8-90 MG TB12   OSA on CPAP    Weight gain noted.  Planning on restarting trial CPAP.  Requests pulm referral - placed.  Last saw Dr Belia Heman 2018       Relevant Orders   Ambulatory referral to Pulmonology   Chronic heel pain, left - Primary    Describes plantar fascitis - reviewed conservative treatment including water bottle massage, daily stretching of plantar fascia, NSAID use, heel lift insert.       Chronic cough    Anticipate silent reflux. Prior cough improved with PPI (2019). Will restart omeprazole 40mg  daily for 3 wks then PRN.           Meds ordered this encounter  Medications  . Cyanocobalamin (B-12) 1000 MCG SUBL    Sig: Place 1 tablet under the tongue daily.    Dispense:  30 tablet  . Cholecalciferol (VITAMIN D3) 25 MCG (1000 UT) CAPS    Sig: Take 1 capsule (1,000 Units total) by mouth daily.    Dispense:  30 capsule  . Naltrexone-buPROPion HCl ER (CONTRAVE) 8-90 MG TB12    Sig: Start 1 tablet every morning for 7 days, then 1 tablet twice daily for 7 days, then 2 tablets every morning and one every evening    Dispense:  120 tablet    Refill:  1  . omeprazole (PRILOSEC) 40 MG capsule    Sig: Take 1 capsule (40 mg total) by mouth daily. For 3 wks then as needed    Dispense:  30 capsule    Refill:  3   Orders Placed This Encounter  Procedures  . Ambulatory referral to Pulmonology    Referral Priority:   Routine    Referral Type:   Consultation    Referral  Reason:   Specialty Services Required    Requested Specialty:   Pulmonary Disease    Number of Visits Requested:   1     Patient Instructions  For ongoing plantar fasciitis of left heel - regular stretching, frozen water bottle massages, may continue ibuprofen as needed (trying to minimize), gel insert to left heel.  For weight loss - work on mindfulness and more deliberate eating.   out contrave. Start 1 tab daily for 1 week then increase to twice daily for 1 week then 2 in am and 1 in pm from then on.  Return as needed or in 1 month for follow up after starting contrave.  Return in 6 months for physical.    Follow up plan: Return in about 4 weeks (around 08/07/2020) for follow up visit, annual exam, prior fasting for blood work.  [name], MD

## 2020-07-10 NOTE — Assessment & Plan Note (Signed)
Describes plantar fascitis - reviewed conservative treatment including water bottle massage, daily stretching of plantar fascia, NSAID use, heel lift insert.

## 2020-07-10 NOTE — Assessment & Plan Note (Signed)
Weight gain noted.  Planning on restarting trial CPAP.  Requests pulm referral - placed.  Last saw Dr Belia Heman 2018

## 2020-07-10 NOTE — Assessment & Plan Note (Signed)
Reviewed weight gain noted, encouraged renewing efforts to follow healthy diet and lifestyle changes to affect sustainable weight loss.  Interested in bariatric medication. Reviewed options. Notes predominant trouble at night - enjoys snacking on junk food. Discussed mindfulness/deliberate eating. Discussed Contrave as option to curb cravings. Reviewed combination medication, side effects to watch for. Start taper over the next month then RTC 1 mo weight f/u.

## 2020-07-13 NOTE — Telephone Encounter (Addendum)
Submitted PA; key:  [id number].  Decision pending.   Notified pt by phn of above.  Pt verbalizes understanding.

## 2020-07-20 ENCOUNTER — Ambulatory Visit: Payer: 59 | Admitting: Family Medicine

## 2020-07-21 NOTE — Telephone Encounter (Signed)
Left message on vm per dpr relaying Dr. G's message.  

## 2020-07-21 NOTE — Telephone Encounter (Signed)
Received faxed PA denial.  Reason:  Pt's rx benefit plan does not cover the requested medication.

## 2020-07-21 NOTE — Telephone Encounter (Signed)
Insurance usually doesn't cover bariatric medications.  Alternatively we could either try individual components or he could google "Contrave savings coupon" to see if any cheaper with coupon.

## 2020-07-26 ENCOUNTER — Ambulatory Visit: Payer: No Typology Code available for payment source | Admitting: Family Medicine

## 2020-07-30 ENCOUNTER — Other Ambulatory Visit: Payer: Self-pay | Admitting: Family Medicine

## 2020-07-30 DIAGNOSIS — E538 Deficiency of other specified B group vitamins: Secondary | ICD-10-CM

## 2020-07-30 DIAGNOSIS — E785 Hyperlipidemia, unspecified: Secondary | ICD-10-CM

## 2020-07-30 DIAGNOSIS — N401 Enlarged prostate with lower urinary tract symptoms: Secondary | ICD-10-CM

## 2020-07-30 DIAGNOSIS — R351 Nocturia: Secondary | ICD-10-CM

## 2020-08-02 ENCOUNTER — Ambulatory Visit: Payer: No Typology Code available for payment source | Admitting: Family Medicine

## 2020-08-02 ENCOUNTER — Other Ambulatory Visit: Payer: No Typology Code available for payment source

## 2020-08-14 ENCOUNTER — Other Ambulatory Visit: Payer: Self-pay

## 2020-08-14 ENCOUNTER — Encounter: Payer: Self-pay | Admitting: Pulmonary Disease

## 2020-08-14 ENCOUNTER — Ambulatory Visit: Payer: No Typology Code available for payment source | Admitting: Pulmonary Disease

## 2020-08-14 VITALS — BP 124/88 | HR 78 | Temp 98.3°F | Ht 77.0 in | Wt 347.0 lb

## 2020-08-14 DIAGNOSIS — G4733 Obstructive sleep apnea (adult) (pediatric): Secondary | ICD-10-CM | POA: Diagnosis not present

## 2020-08-14 NOTE — Patient Instructions (Signed)
History of obstructive sleep apnea Witnessed apneas  We will schedule you for a home sleep study Auto titrating CPAP will be initiated following reviewing study  Follow-up in 3 months  [place] with significant concerns Sleep Apnea Sleep apnea affects breathing during sleep. It causes breathing to stop for a short time or to become shallow. It can also increase the risk of:  Heart attack.  Stroke.  Being very overweight (obese).  Diabetes.  Heart failure.  Irregular heartbeat. The goal of treatment is to help you breathe normally again. What are the causes? There are three kinds of sleep apnea:  Obstructive sleep apnea. This is caused by a blocked or collapsed airway.  Central sleep apnea. This happens when the brain does not send the right signals to the muscles that control breathing.  Mixed sleep apnea. This is a combination of obstructive and central sleep apnea. The most common cause of this condition is a collapsed or blocked airway. This can happen if:  Your throat muscles are too relaxed.  Your tongue and tonsils are too large.  You are overweight.  Your airway is too small. What increases the risk?  Being overweight.  Smoking.  Having a small airway.  Being older.  Being male.  Drinking alcohol.  Taking medicines to calm yourself (sedatives or tranquilizers).  Having family members with the condition. What are the signs or symptoms?  Trouble staying asleep.  Being sleepy or tired during the day.  Getting angry a lot.  Loud snoring.  Headaches in the morning.  Not being able to focus your mind (concentrate).  Forgetting things.  Less interest in sex.  Mood swings.  Personality changes.  Feelings of sadness (depression).  Waking up a lot during the night to pee (urinate).  Dry mouth.  Sore throat. How is this diagnosed?  Your medical history.  A physical exam.  A test that is done when you are sleeping (sleep study). The  test is most often done in a sleep lab but may also be done at home. How is this treated?   Sleeping on your side.  Using a medicine to get rid of mucus in your nose (decongestant).  Avoiding the use of alcohol, medicines to help you relax, or certain pain medicines (narcotics).  Losing weight, if needed.  Changing your diet.  Not smoking.  Using a machine to open your airway while you sleep, such as: ? An oral appliance. This is a mouthpiece that shifts your lower jaw forward. ? A CPAP device. This device blows air through a mask when you breathe out (exhale). ? An EPAP device. This has valves that you put in each nostril. ? A BPAP device. This device blows air through a mask when you breathe in (inhale) and breathe out.  Having surgery if other treatments do not work. It is important to get treatment for sleep apnea. Without treatment, it can lead to:  High blood pressure.  Coronary artery disease.  In men, not being able to have an erection (impotence).  Reduced thinking ability. Follow these instructions at home: Lifestyle  Make changes that your doctor recommends.  Eat a healthy diet.  Lose weight if needed.  Avoid alcohol, medicines to help you relax, and some pain medicines.  Do not use any products that contain nicotine or tobacco, such as cigarettes, e-cigarettes, and chewing tobacco. If you need help quitting, ask your doctor. General instructions  Take over-the-counter and prescription medicines only as told by your doctor.  If you were given a machine to use while you sleep, use it only as told by your doctor.  If you are having surgery, make sure to tell your doctor you have sleep apnea. You may need to bring your device with you.  Keep all follow-up visits as told by your doctor. This is important. Contact a doctor if:  The machine that you were given to use during sleep bothers you or does not seem to be working.  You do not get better.  You  get worse. Get help right away if:  Your chest hurts.  You have trouble breathing in enough air.  You have an uncomfortable feeling in your back, arms, or stomach.  You have trouble talking.  One side of your body feels weak.  A part of your face is hanging down. These symptoms may be an emergency. Do not wait to see if the symptoms will go away. Get medical help right away. [place] your local emergency services (911 in the [place] Do not drive yourself to the hospital. Summary  This condition affects breathing during sleep.  The most common cause is a collapsed or blocked airway.  The goal of treatment is to help you breathe normally while you sleep. This information is not intended to replace advice given to you by your health care provider. Make sure you discuss any questions you have with your health care provider. Document Revised: 07/17/2018 Document Reviewed: 05/26/2018 Elsevier Patient Education  2020 [employer].

## 2020-08-14 NOTE — Progress Notes (Signed)
Steve Lynch    [id number]    [birth year]  Primary Care Physician:[name], Wynona Canes, MD  Referring Physician: [name], MD [address] [place],  [place] [address]  Chief complaint:   Patient with a past history of obstructive sleep apnea  HPI:  Patient with a history of obstructive sleep apnea diagnosed in June 2018 He did try CPAP for a while but never found a mask that fits well Stopped using CPAP after a while  He still has his old machine  With recurrence of symptoms and with his spouse noticing increased number of events and many seconds of breath-holding, was convinced to come back in for further evaluation  Usually goes to bed between 9 and 10 PM Takes him about 50 minutes to fall asleep 2-4 awakenings Final wake up time about 4:50 AM He has gained about 50 pounds with changes in his lifestyle  He does have snoring, hiccups, nonrestorative sleep No headaches No dryness of his mouth in the morning memory is poor   No family history of obstructive sleep apnea  Outpatient Encounter Medications as of 08/14/2020  Medication Sig  . Cholecalciferol (VITAMIN D3) 25 MCG (1000 UT) CAPS Take 1 capsule (1,000 Units total) by mouth daily.  . Cyanocobalamin (B-12) 1000 MCG SUBL Place 1 tablet under the tongue daily.  Marland Kitchen omeprazole (PRILOSEC) 40 MG capsule Take 1 capsule (40 mg total) by mouth daily. For 3 wks then as needed  . Naltrexone-buPROPion HCl ER (CONTRAVE) 8-90 MG TB12 Start 1 tablet every morning for 7 days, then 1 tablet twice daily for 7 days, then 2 tablets every morning and one every evening (Patient not taking: Reported on 08/14/2020)   No facility-administered encounter medications on file as of 08/14/2020.    Allergies as of 08/14/2020 - Review Complete 08/14/2020  Allergen Reaction Noted  . Oxycodone Rash 10/15/2017    Past Medical History:  Diagnosis Date  . Crushing injury of left thumb 09/15/2017  . Drug abuse in remission (HCC)  1990s   Cocaine with rehab  . Dyslipidemia    mild  . Fracture of tibia with fibula, closed, right, sequela 10/16/2017  . Peripheral vascular disease (HCC) 2004   varicose veins right leg  . Tendonitis 10/2010   temporal tendonitis with locked jaw following tooth extraction  . Vitamin B12 deficiency 09/2013    Past Surgical History:  Procedure Laterality Date  . BACK SURGERY  2005   "DISC  REPLACEMENT"   L5-6   . COLONOSCOPY  12/2011   TAx1, diverticulosis, rpt 5 yrs - no further colonoscopy done yet (Dr Myra Gianotti @ [facility])  . KNEE ARTHROPLASTY Left 2012  . TONSILLECTOMY    . TOTAL KNEE ARTHROPLASTY  11/12/2011   Procedure: TOTAL KNEE ARTHROPLASTY;  Surgeon: Shelda Pal, MD; Laterality: Right  . TOTAL KNEE ARTHROPLASTY Bilateral   . VARICOSE VEIN SURGERY Right    sclerotherapy    Family History  Problem Relation Age of Onset  . Hyperlipidemia Mother   . Hypertension Mother   . Diabetes Mother   . ALS Sister   . Cancer Neg Hx   . CAD Neg Hx   . Stroke Neg Hx     Social History   Socioeconomic History  . Marital status: Married    Spouse name: Not on file  . Number of children: Not on file  . Years of education: Not on file  . Highest education level:  Not on file  Occupational History  . Occupation: Leisure centre manager [employer]  Tobacco Use  . Smoking status: Never Smoker  . Smokeless tobacco: Never Used  Substance and Sexual Activity  . Alcohol use: Yes    Comment: Very rare  . Drug use: No    Comment: COCAINE ADDICTION WITH REHAB   21 YEARS AGO  . Sexual activity: Not on file  Other Topics Concern  . 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 Difficulty of Paying Living Expenses: Not on file  Food Insecurity:   . Worried About Programme researcher, broadcasting/film/video in the Last Year: Not on file  . Ran Out of Food in the Last Year: Not on file  Transportation Needs:   . Lack of Transportation (Medical): Not on file  . Lack of Transportation (Non-Medical): Not on file  Physical Activity:   . Days of Exercise per Week: Not on file  . Minutes of Exercise per Session: Not on file  Stress:   . Feeling of Stress : Not on file  Social Connections:   . Frequency of Communication with Friends and Family: Not on file  . Frequency of Social Gatherings with Friends and Family: Not on file  . Attends Religious Services: Not on file  . Active Member of Clubs or Organizations: Not on file  . Attends Banker Meetings: Not on file  . Marital Status: Not on file  Intimate Partner Violence:   . Fear of Current or Ex-Partner: Not on file  . Emotionally Abused: Not on file  . Physically Abused: Not on file  . Sexually Abused: Not on file    Review of Systems  Respiratory: Positive for apnea.   Psychiatric/Behavioral: Positive for sleep disturbance.    Vitals:   08/14/20 1627  BP: 124/88  Pulse: 78  Temp: 98.3 F (36.8 C)  SpO2: 97%     Physical Exam Constitutional:      Appearance: He is obese.  HENT:     Mouth/Throat:     Mouth: Mucous membranes are moist.     Comments: Mallampati 4, crowded oropharynx Eyes:     General:        Right eye: No discharge.        Left eye: No discharge.  Cardiovascular:     Rate and Rhythm: Normal rate and regular rhythm.     Heart sounds: No murmur heard.  No friction rub.  Pulmonary:     Effort: No respiratory distress.     Breath sounds: No stridor. No wheezing or rhonchi.  Musculoskeletal:     Cervical back: No rigidity or tenderness.  Neurological:     Mental Status: He is alert.  Psychiatric:        Mood and Affect: Mood  normal.    Results of the Epworth flowsheet 08/14/2020 02/27/2017 08/22/2016  Sitting and reading 3 3 3   Watching TV 3 1 3   Sitting, inactive in a public place (e.g. a theatre or a meeting) 1 3 3   As a passenger in a car for an hour  without a break 0 0 0  Lying down to rest in the afternoon when circumstances permit 3 3 3   Sitting and talking to someone 2 0 0  Sitting quietly after a lunch without alcohol 3 2 3   In a car, while stopped for a few minutes in traffic 0 0 0  Total score 15 12 15     Data Reviewed: Home sleep study from 2018 reviewed showing severe obstructive sleep apnea with AHI of 63  Titration study reviewed showing he was titrated to CPAP of 12  Assessment:  Patient with history of obstructive sleep apnea Has not been on CPAP for a few years  Excessive daytime sleepiness  Obesity  Pathophysiology of sleep disordered breathing discussed Treatment options discussed  Plan/Recommendations: We will schedule patient for home sleep study  We will update with results as soon as available  Plan will be auto titrating CPAP -Previous study was titrated to CPAP of 12 -Was having problems with mask fit -We will need a lot of work with the DME company to find a mask that fits well -Auto titrating CPAP may also avoid the problem of a fixed CPAP if the pressure was too high at the time  Follow-up in 3 months  Continue weight loss efforts   MD [facility] 08/14/2020, 4:32 PM  CC: 2019, MD

## 2020-09-22 ENCOUNTER — Telehealth: Payer: Self-pay | Admitting: Pulmonary Disease

## 2020-09-22 ENCOUNTER — Other Ambulatory Visit: Payer: Self-pay

## 2020-09-22 ENCOUNTER — Ambulatory Visit: Payer: No Typology Code available for payment source

## 2020-09-22 DIAGNOSIS — G4733 Obstructive sleep apnea (adult) (pediatric): Secondary | ICD-10-CM

## 2020-09-22 NOTE — Telephone Encounter (Signed)
I spoke to pt & made him aware he can do study tonight, tomorrow night or Sunday night.  Machine just due back Monday.  Pt states ok.  Nothing further needed.

## 2020-10-03 ENCOUNTER — Telehealth: Payer: Self-pay | Admitting: Pulmonary Disease

## 2020-10-03 DIAGNOSIS — G4733 Obstructive sleep apnea (adult) (pediatric): Secondary | ICD-10-CM

## 2020-10-03 NOTE — Telephone Encounter (Signed)
[facility] patient  Sleep study result  Date of study: 09/23/2020  Impression: Severe obstructive sleep apnea Moderately severe oxygen desaturations  Recommendation: DME referral  Recommend CPAP therapy for severe obstructive sleep apnea  Auto titrating CPAP with pressure settings of 5-20 will be appropriate  Encourage weight loss measures  Follow-up in the office 4 to 6 weeks following initiation of treatment

## 2020-10-03 NOTE — Telephone Encounter (Signed)
Called and spoke with pt letting him know the results of the HST per AO and he verbalized understanding. Pt stated that he already had a cpap machine that was prescribed by our office before but he just wasn't using it. Stated to pt that we would send Rx for pressure settings to DME and he verbalized understanding. appt has been scheduled for pt for f/u after restarting CPAP. Nothing further needed.

## 2020-10-19 ENCOUNTER — Other Ambulatory Visit: Payer: Self-pay | Admitting: Family Medicine

## 2020-10-21 ENCOUNTER — Other Ambulatory Visit: Payer: Self-pay | Admitting: Family Medicine

## 2020-11-09 ENCOUNTER — Emergency Department: Payer: No Typology Code available for payment source

## 2020-11-09 ENCOUNTER — Encounter: Payer: Self-pay | Admitting: Emergency Medicine

## 2020-11-09 ENCOUNTER — Telehealth: Payer: Self-pay

## 2020-11-09 ENCOUNTER — Emergency Department
Admission: EM | Admit: 2020-11-09 | Discharge: 2020-11-09 | Disposition: A | Discharge status: Home / Self Care | Payer: No Typology Code available for payment source | Attending: Emergency Medicine | Admitting: Emergency Medicine

## 2020-11-09 ENCOUNTER — Ambulatory Visit: Payer: No Typology Code available for payment source | Admitting: Pulmonary Disease

## 2020-11-09 ENCOUNTER — Other Ambulatory Visit: Payer: Self-pay

## 2020-11-09 DIAGNOSIS — N200 Calculus of kidney: Secondary | ICD-10-CM | POA: Insufficient documentation

## 2020-11-09 DIAGNOSIS — R310 Gross hematuria: Secondary | ICD-10-CM | POA: Diagnosis present

## 2020-11-09 LAB — CBC WITH DIFFERENTIAL/PLATELET
Abs Immature Granulocytes: 0.05 10*3/uL (ref 0.00–0.07)
Basophils Absolute: 0 10*3/uL (ref 0.0–0.1)
Basophils Relative: 1 %
Eosinophils Absolute: 0.2 10*3/uL (ref 0.0–0.5)
Eosinophils Relative: 2 %
HCT: 42.1 % (ref 39.0–52.0)
Hemoglobin: 14.5 g/dL (ref 13.0–17.0)
Immature Granulocytes: 1 %
Lymphocytes Relative: 25 %
Lymphs Abs: 1.8 10*3/uL (ref 0.7–4.0)
MCH: 30.2 pg (ref 26.0–34.0)
MCHC: 34.4 g/dL (ref 30.0–36.0)
MCV: 87.7 fL (ref 80.0–100.0)
Monocytes Absolute: 0.5 10*3/uL (ref 0.1–1.0)
Monocytes Relative: 6 %
Neutro Abs: 4.7 10*3/uL (ref 1.7–7.7)
Neutrophils Relative %: 65 %
Platelets: 242 10*3/uL (ref 150–400)
RBC: 4.8 MIL/uL (ref 4.22–5.81)
RDW: 12.2 % (ref 11.5–15.5)
WBC: 7.2 10*3/uL (ref 4.0–10.5)
nRBC: 0 % (ref 0.0–0.2)

## 2020-11-09 LAB — URINALYSIS, COMPLETE (UACMP) WITH MICROSCOPIC
Bilirubin Urine: NEGATIVE
Glucose, UA: NEGATIVE mg/dL
Ketones, ur: NEGATIVE mg/dL
Leukocytes,Ua: NEGATIVE
Nitrite: NEGATIVE
Protein, ur: 100 mg/dL — AB
RBC / HPF: >50 RBC/hpf — ABNORMAL HIGH (ref 0–5)
Specific Gravity, Urine: 1.021 (ref 1.005–1.030)
pH: 5 (ref 5.0–8.0)

## 2020-11-09 LAB — COMPREHENSIVE METABOLIC PANEL
ALT: 16 U/L (ref 0–44)
AST: 17 U/L (ref 15–41)
Albumin: 3.9 g/dL (ref 3.5–5.0)
Alkaline Phosphatase: 80 U/L (ref 38–126)
Anion gap: 14 (ref 5–15)
BUN: 22 mg/dL (ref 8–23)
CO2: 21 mmol/L — ABNORMAL LOW (ref 22–32)
Calcium: 9 mg/dL (ref 8.9–10.3)
Chloride: 107 mmol/L (ref 98–111)
Creatinine, Ser: 0.92 mg/dL (ref 0.61–1.24)
GFR, Estimated: >60 mL/min (ref >60)
Glucose, Bld: 85 mg/dL (ref 70–99)
Potassium: 3.9 mmol/L (ref 3.5–5.1)
Sodium: 142 mmol/L (ref 135–145)
Total Bilirubin: 0.7 mg/dL (ref 0.3–1.2)
Total Protein: 8.3 g/dL — ABNORMAL HIGH (ref 6.5–8.1)

## 2020-11-09 MED ORDER — CEPHALEXIN 500 MG PO CAPS: 500.0000 mg | ORAL_CAPSULE | Freq: Three times a day (TID) | ORAL | 0 refills | Status: AC

## 2020-11-09 NOTE — ED Provider Notes (Signed)
[facility] Emergency Department Provider Note   ____________________________________________   Event Date/Time   First MD Initiated Contact with Patient 11/09/20 1410     (approximate)  I have reviewed the triage vital signs and the nursing notes.   HISTORY  Chief Complaint Hematuria    HPI Steve Lynch is a 65 y.o. male who comes in reporting that he was seen about a month ago for kidney stone but the pain went away and he thought the stone went away.  He developed painless hematuria today lasted an hour or so and then went away.  He is not taking any blood thinners.  He is not had any trauma.  He is never had this before.         Past Medical History:  Diagnosis Date  . Crushing injury of left thumb 09/15/2017  . Drug abuse in remission (HCC) 1990s   Cocaine with rehab  . Dyslipidemia    mild  . Fracture of tibia with fibula, closed, right, sequela 10/16/2017  . Peripheral vascular disease (HCC) 2004   varicose veins right leg  . Tendonitis 10/2010   temporal tendonitis with locked jaw following tooth extraction  . Vitamin B12 deficiency 09/2013    Patient Active Problem List   Diagnosis Date Noted  . Chronic heel pain, left 07/10/2020  . Subconjunctival hemorrhage of left eye 01/22/2020  . Paresthesias 01/22/2020  . Intertrigo 01/22/2020  . Fracture of tibia with fibula, closed, right, sequela 10/16/2017  . Essential tremor 10/14/2017  . MDD (major depressive disorder), single episode, moderate (HCC) 04/02/2017  . OSA on CPAP 08/13/2016  . Severe obesity (BMI >= 40) (HCC) 08/13/2016  . BPH associated with nocturia 08/13/2016  . Leg edema, right 08/13/2016  . Dyslipidemia   . Healthcare maintenance 10/01/2013  . Vitamin B12 deficiency 09/13/2013  . Chronic cough 08/16/2013  . Fatigue 08/16/2013    Past Surgical History:  Procedure Laterality Date  . BACK SURGERY  2005   "DISC  REPLACEMENT"   L5-6   . COLONOSCOPY  12/2011    TAx1, diverticulosis, rpt 5 yrs - no further colonoscopy done yet (Dr Myra Gianotti @ [facility])  . KNEE ARTHROPLASTY Left 2012  . TONSILLECTOMY    . TOTAL KNEE ARTHROPLASTY  11/12/2011   Procedure: TOTAL KNEE ARTHROPLASTY;  Surgeon: Shelda Pal, MD; Laterality: Right  . TOTAL KNEE ARTHROPLASTY Bilateral   . VARICOSE VEIN SURGERY Right    sclerotherapy    Prior to Admission medications   Medication Sig Start Date End Date Taking? Authorizing Provider  Cholecalciferol (VITAMIN D3) 25 MCG (1000 UT) CAPS Take 1 capsule (1,000 Units total) by mouth daily. 07/10/20   [name], MD  Cyanocobalamin (B-12) 1000 MCG SUBL Place 1 tablet under the tongue daily. 07/10/20   [name], MD  Naltrexone-buPROPion HCl ER (CONTRAVE) 8-90 MG TB12 Start 1 tablet every morning for 7 days, then 1 tablet twice daily for 7 days, then 2 tablets every morning and one every evening Patient not taking: Reported on 08/14/2020 07/10/20   [name], MD  omeprazole (PRILOSEC) 40 MG capsule TAKE 1 CAPSULE (40 MG TOTAL) BY MOUTH DAILY. FOR 3 WKS THEN AS NEEDED 10/23/20   [name], MD    Allergies Oxycodone  Family History  Problem Relation Age of Onset  . Hyperlipidemia Mother   . Hypertension Mother   . Diabetes Mother   . ALS Sister   . Cancer Neg Hx   .  CAD Neg Hx   . Stroke Neg Hx     Social History Social History   Tobacco Use  . Smoking status: Never Smoker  . Smokeless tobacco: Never Used  Substance Use Topics  . Alcohol use: Yes    Comment: Very rare  . Drug use: No    Comment: COCAINE ADDICTION WITH REHAB   21 YEARS AGO    Review of Systems  Constitutional: No fever/chills Eyes: No visual changes. ENT: No sore throat. Cardiovascular: Denies chest pain. Respiratory: Denies shortness of breath. Gastrointestinal: No abdominal pain.  No nausea, no vomiting.  No diarrhea.  No constipation. Genitourinary: Negative for dysuria. Musculoskeletal: Negative for  back pain. Skin: Negative for rash. Neurological: Negative for headaches, focal weakness   ____________________________________________   PHYSICAL EXAM:  VITAL SIGNS: ED Triage Vitals [11/09/20 1308]  Enc Vitals Group     BP 115/79     Pulse Rate 98     Resp 18     Temp 98.8 F (37.1 C)     Temp Source Oral     SpO2 98 %     Weight (!) 345 lb (156.5 kg)     Height 6\' 5"  (1.956 m)     Head Circumference      Peak Flow      Pain Score 0     Pain Loc      Pain Edu?      Excl. in GC?     Constitutional: Alert and oriented. Well appearing and in no acute distress. Eyes: Conjunctivae are normal.  Head: Atraumatic. Nose: No congestion/rhinnorhea. Mouth/Throat: Mucous membranes are moist.  Oropharynx non-erythematous. Neck: No stridor.   Cardiovascular: Normal rate, regular rhythm. Grossly normal heart sounds.  Good peripheral circulation. Respiratory: Normal respiratory effort.  No retractions. Lungs CTAB. Gastrointestinal: Soft and nontender. No distention. No abdominal bruits. No CVA tenderness. Musculoskeletal: No lower extremity tenderness nor edema.   Neurologic:  Normal speech and language. No gross focal neurologic deficits are appreciated. No gait instability. Skin:  Skin is warm, dry and intact.   ____________________________________________   LABS (all labs ordered are listed, but only abnormal results are displayed)  Labs Reviewed  COMPREHENSIVE METABOLIC PANEL - Abnormal; Notable for the following components:      Result Value   CO2 21 (*)    Total Protein 8.3 (*)    All other components within normal limits  URINALYSIS, COMPLETE (UACMP) WITH MICROSCOPIC - Abnormal; Notable for the following components:   Color, Urine AMBER (*)    APPearance CLOUDY (*)    Hgb urine dipstick LARGE (*)    Protein, ur 100 (*)    RBC / HPF >50 (*)    Bacteria, UA RARE (*)    All other components within normal limits  CBC WITH DIFFERENTIAL/PLATELET    ____________________________________________  EKG   ____________________________________________  RADIOLOGY , personally viewed and evaluated these images (plain radiographs) as part of my medical decision making, as well as reviewing the written report by the radiologist.  ED MD interpretation: CT read by radiology shows 2 stones on the left side and some mild hydronephrosis  Official radiology report(s): CT Renal Stone Study  Result Date: 11/09/2020 CLINICAL DATA:  Hematuria EXAM: CT ABDOMEN AND PELVIS WITHOUT CONTRAST TECHNIQUE: Multidetector CT imaging of the abdomen and pelvis was performed following the standard protocol without IV contrast. COMPARISON:  September 27, 2019 FINDINGS: Lower chest: No acute abnormality. Hepatobiliary: Unremarkable noncontrast appearance of the  liver. Gallbladder is unremarkable. Pancreas: No peripancreatic fat stranding. Spleen: Spleen is unremarkable. Adrenals/Urinary Tract: There is unchanged nodularity of the LEFT adrenal gland. There is minimal LEFT hydroureteronephrosis, decreased in comparison to prior. There is a punctate nephrolithiasis within the proximal LEFT ureter, new since prior. There is a 2 mm nephrolithiasis within the distal LEFT ureter, unchanged in comparison to prior. One of the nephrolithiasis within the distal LEFT ureter described on prior CT has since resolved. There is fat stranding adjacent to the LEFT ureter, mildly decreased in comparison to prior. There are RIGHT-sided parapelvic cysts. There are multiple additional punctate nonobstructive nephrolithiasis within the LEFT kidney. The bladder is decompressed. Stomach/Bowel: No evidence of bowel obstruction. Moderate colonic stool burden. Appendix is unremarkable. Sigmoid and descending colon diverticulosis without evidence of diverticulitis. Vascular/Lymphatic: Mild atherosclerotic calcifications of the aorta. Unchanged calcification of the varicose RIGHT greater  saphenous vein. No suspicious lymphadenopathy. Reproductive: Prostate is present. Other: No free air or free fluid. Musculoskeletal: Dextrocurvature of the lumbar spine. Multilevel degenerative changes of the lumbar spine. IMPRESSION: 1. There is minimal LEFT hydroureteronephrosis. There is a new punctate nephrolithiasis within the proximal LEFT ureter and unchanged 2 mm nephrolithiasis within the distal LEFT ureter. There is fat stranding adjacent to the LEFT ureter, mildly decreased in comparison to prior study. Aortic Atherosclerosis (ICD10-I70.0). Electronically Signed   By: Meda Klinefelter MD   On: 11/09/2020 14:11    ____________________________________________   PROCEDURES  Procedure(s) performed (including Critical Care):  Procedures   ____________________________________________   INITIAL IMPRESSION / ASSESSMENT AND PLAN / ED COURSE  Discussed patient with Dr. Gabrielle Dare urology.  We will give him some Keflex for possible infection and urology will follow up with him.  He is not having any pain fever tachycardia nausea flank pain or anything else.             ____________________________________________   FINAL CLINICAL IMPRESSION(S) / ED DIAGNOSES  Final diagnoses:  Renal stone  Gross hematuria     ED Discharge Orders    None      *Please note:  Jen Eppinger Stille was evaluated in Emergency Department on 11/09/2020 for the symptoms described in the history of present illness. He was evaluated in the context of the global COVID-19 pandemic, which necessitated consideration that the patient might be at risk for infection with the SARS-CoV-2 virus that causes COVID-19. Institutional protocols and algorithms that pertain to the evaluation of patients at risk for COVID-19 are in a state of rapid change based on information released by regulatory bodies including the CDC and federal and state organizations. These policies and algorithms were followed during the patient's care  in the ED.  Some ED evaluations and interventions may be delayed as a result of limited staffing during and the pandemic.*   Note:  This document was prepared using Dragon voice recognition software and may include unintentional dictation errors.    Arnaldo Natal, MD 11/09/20 [phone]

## 2020-11-09 NOTE — ED Provider Notes (Signed)
MSE was initiated and I personally evaluated the patient and placed orders (if any) at  1:07 PM on November 09, 2020.  Patient awake alert reports onset of hematuria today. Walking without any discomfort. He says he brought his lunch because he has been here before. He called his doctor and his doctor told him to come here for some imaging. Review of old records shows he has a history of peripheral vascular disease tendinitis and vitamin B12 deficiency back and knee surgery.  The patient appears stable so that the remainder of the MSE may be completed by another provider.   Arnaldo Natal, MD 11/09/20 1308

## 2020-11-09 NOTE — Telephone Encounter (Signed)
Possibly recurrent stone. Agree with urgent eval today given concern for urinary retention.  Will await pt update. Thank you.

## 2020-11-09 NOTE — Telephone Encounter (Signed)
Pt left v/m that starting this morning pt started passing a lot of bright red blood when urinates with uncontrollable urgency to urinate but pt either cannot get out any urine or voids very small amt. Pt had kidney stone 6 mths ago but pt not having any pain or fever. Pt wants to know if could be UTI or kidney stone; advised pt would need to go to ED for eval and imaging; pt said he is not going to sit for hours so pt is going to [facility] in [place]. Pt will cb on 11/10/20 with update. Sending note to DR G who is out of office and Dr Para March who is in office.

## 2020-11-09 NOTE — ED Triage Notes (Signed)
First nurse Note:  Arrives C/O hematuria today.

## 2020-11-09 NOTE — Discharge Instructions (Addendum)
I think that the blood in the urine is coming from the 2 kidney stones that are on the left side.  They are both small and should pass without any trouble.  The one is very small.  You do have some white blood cells in the urine.  I will give you some Keflex antibiotic 1 3 times a day to take to make sure that any infection that might be indicated by the white blood cells does not progress.  Dr. Gabrielle Dare the urologist will get you follow-up within the next week or so.  If he does not [place] in the next day or 2 I would Hirano his office at the number above.

## 2020-11-09 NOTE — ED Triage Notes (Signed)
Pt comes into the ED via POV c/o hematuria that started today.  Pt states he was diagnosed with a kidney stone a couple weeks ago but hasnt had a problem since then until today. Pt states this morning he had a sudden urge to use the bathroom and then it has been coming out as pure blood since then.  Pt denies any blood thinner use.  Pt ambulatory to triage and in NAD.

## 2020-11-16 ENCOUNTER — Ambulatory Visit
Admission: RE | Admit: 2020-11-16 | Discharge: 2020-11-16 | Disposition: A | Discharge status: Home / Self Care | Payer: No Typology Code available for payment source | Source: Ambulatory Visit | Attending: Urology | Admitting: Urology

## 2020-11-16 ENCOUNTER — Ambulatory Visit (INDEPENDENT_AMBULATORY_CARE_PROVIDER_SITE_OTHER): Payer: No Typology Code available for payment source | Admitting: Urology

## 2020-11-16 ENCOUNTER — Encounter: Payer: Self-pay | Admitting: Urology

## 2020-11-16 ENCOUNTER — Other Ambulatory Visit: Payer: Self-pay

## 2020-11-16 VITALS — BP 134/83 | HR 102 | Ht 77.0 in | Wt 340.0 lb

## 2020-11-16 DIAGNOSIS — N200 Calculus of kidney: Secondary | ICD-10-CM

## 2020-11-16 DIAGNOSIS — N201 Calculus of ureter: Secondary | ICD-10-CM

## 2020-11-16 DIAGNOSIS — N132 Hydronephrosis with renal and ureteral calculous obstruction: Secondary | ICD-10-CM

## 2020-11-16 NOTE — Progress Notes (Signed)
11/16/2020 2:40 PM   Steve Lynch 1956-07-04 [id number]  Referring provider: [name], MD [address] [place],  [place] [address]  Chief Complaint  Patient presents with  . Other    HPI: Steve Lynch is a 65 y.o. male who presents for follow-up of a recent ED visit.   Had onset of gross hematuria associated with severe urinary urgency and small volume voids on 11/09/2020  PCP recommended he proceed to the ED for further evaluation  Stone protocol CT performed which showed a 2 mm left distal ureteral calculus with mild hydronephrosis with a punctate calcification in the left proximal ureter  Denies flank or abdominal pain and he was discharged on oral antibiotics.  Urine culture was not ordered.  Since ED visit he has been asymptomatic and not aware of passing a stone  He was seen in the ED on 09/27/2019 with left lower quadrant abdominal pain and CT at that time showed a 2 mm left distal ureteral calculus near the UVJ.  He states he was evaluated in December 2021 however there are no records of a visit or imaging in Epic at that time   PMH: Past Medical History:  Diagnosis Date  . Crushing injury of left thumb 09/15/2017  . Drug abuse in remission (HCC) 1990s   Cocaine with rehab  . Dyslipidemia    mild  . Fracture of tibia with fibula, closed, right, sequela 10/16/2017  . Peripheral vascular disease (HCC) 2004   varicose veins right leg  . Tendonitis 10/2010   temporal tendonitis with locked jaw following tooth extraction  . Vitamin B12 deficiency 09/2013    Surgical History: Past Surgical History:  Procedure Laterality Date  . BACK SURGERY  2005   "DISC  REPLACEMENT"   L5-6   . COLONOSCOPY  12/2011   TAx1, diverticulosis, rpt 5 yrs - no further colonoscopy done yet (Dr Myra Gianotti @ [facility])  . KNEE ARTHROPLASTY Left 2012  . TONSILLECTOMY    . TOTAL KNEE ARTHROPLASTY  11/12/2011   Procedure: TOTAL KNEE ARTHROPLASTY;  Surgeon: Shelda Pal, MD; Laterality: Right  . TOTAL KNEE ARTHROPLASTY Bilateral   . VARICOSE VEIN SURGERY Right    sclerotherapy    Home Medications:  Allergies as of 11/16/2020      Reactions   Oxycodone Rash      Medication List       Accurate as of November 16, 2020  2:40 PM. If you have any questions, ask your nurse or doctor.        B-12 1000 MCG Subl Place 1 tablet under the tongue daily.   cephALEXin 500 MG capsule Commonly known as: KEFLEX Take 1 capsule (500 mg total) by mouth 3 (three) times daily for 10 days.   Contrave 8-90 MG Tb12 Generic drug: Naltrexone-buPROPion HCl ER Start 1 tablet every morning for 7 days, then 1 tablet twice daily for 7 days, then 2 tablets every morning and one every evening   omeprazole 40 MG capsule Commonly known as: PRILOSEC TAKE 1 CAPSULE (40 MG TOTAL) BY MOUTH DAILY. FOR 3 WKS THEN AS NEEDED   Vitamin D3 25 MCG (1000 UT) Caps Take 1 capsule (1,000 Units total) by mouth daily.       Allergies:  Allergies  Allergen Reactions  . Oxycodone Rash    Family History: Family History  Problem Relation Age of Onset  . Hyperlipidemia Mother   . Hypertension Mother   . Diabetes Mother   .  ALS Sister   . Cancer Neg Hx   . CAD Neg Hx   . Stroke Neg Hx     Social History:  reports that he has never smoked. He has never used smokeless tobacco. He reports current alcohol use. He reports that he does not use drugs.   Physical Exam: BP 134/83   Pulse (!) 102   Ht 6\' 5"  (1.956 m)   Wt (!) 340 lb (154.2 kg)   BMI 40.32 kg/m   Constitutional:  Alert and oriented, No acute distress. HEENT: [place] AT, moist mucus membranes.  Trachea midline, no masses. Cardiovascular: No clubbing, cyanosis, or edema. Respiratory: Normal respiratory effort, no increased work of breathing. Skin: No rashes, bruises or suspicious lesions. Neurologic: Grossly intact, no focal deficits, moving all 4 extremities. Psychiatric: Normal mood and affect.   Pertinent  Imaging: CT images personally reviewed and interpreted  CT Renal Stone Study  Narrative CLINICAL DATA:  Hematuria  EXAM: CT ABDOMEN AND PELVIS WITHOUT CONTRAST  TECHNIQUE: Multidetector CT imaging of the abdomen and pelvis was performed following the standard protocol without IV contrast.  COMPARISON:  September 27, 2019  FINDINGS: Lower chest: No acute abnormality.  Hepatobiliary: Unremarkable noncontrast appearance of the liver. Gallbladder is unremarkable.  Pancreas: No peripancreatic fat stranding.  Spleen: Spleen is unremarkable.  Adrenals/Urinary Tract: There is unchanged nodularity of the LEFT adrenal gland. There is minimal LEFT hydroureteronephrosis, decreased in comparison to prior. There is a punctate nephrolithiasis within the proximal LEFT ureter, new since prior. There is a 2 mm nephrolithiasis within the distal LEFT ureter, unchanged in comparison to prior. One of the nephrolithiasis within the distal LEFT ureter described on prior CT has since resolved. There is fat stranding adjacent to the LEFT ureter, mildly decreased in comparison to prior. There are RIGHT-sided parapelvic cysts. There are multiple additional punctate nonobstructive nephrolithiasis within the LEFT kidney. The bladder is decompressed.  Stomach/Bowel: No evidence of bowel obstruction. Moderate colonic stool burden. Appendix is unremarkable. Sigmoid and descending colon diverticulosis without evidence of diverticulitis.  Vascular/Lymphatic: Mild atherosclerotic calcifications of the aorta. Unchanged calcification of the varicose RIGHT greater saphenous vein. No suspicious lymphadenopathy.  Reproductive: Prostate is present.  Other: No free air or free fluid.  Musculoskeletal: Dextrocurvature of the lumbar spine. Multilevel degenerative changes of the lumbar spine.  IMPRESSION: 1. There is minimal LEFT hydroureteronephrosis. There is a new punctate nephrolithiasis within the  proximal LEFT ureter and unchanged 2 mm nephrolithiasis within the distal LEFT ureter. There is fat stranding adjacent to the LEFT ureter, mildly decreased in comparison to prior study.  Aortic Atherosclerosis (ICD10-I70.0).   Electronically Signed By: September 29, 2019 MD On: 11/09/2020 14:11   Assessment & Plan:    1.  Left distal ureteral calculus  Presently asymptomatic  KUB ordered and if the stone is not visualized would recommend a follow-up renal ultrasound to document resolution of his hydronephrosis  Records indicate he had a left distal ureteral calculus in December 2020 although there is a discrepancy according to the patient.  This would most likely represents a new stone and not a persistent stone for 14 months   January 2021, MD  [facility] [address], [address] [place], [place] [phone]

## 2020-11-18 ENCOUNTER — Encounter: Payer: Self-pay | Admitting: Urology

## 2020-11-19 ENCOUNTER — Other Ambulatory Visit: Payer: Self-pay | Admitting: Urology

## 2020-11-19 DIAGNOSIS — N132 Hydronephrosis with renal and ureteral calculous obstruction: Secondary | ICD-10-CM

## 2020-11-20 ENCOUNTER — Telehealth: Payer: Self-pay | Admitting: *Deleted

## 2020-11-20 ENCOUNTER — Encounter: Payer: Self-pay | Admitting: *Deleted

## 2020-11-20 NOTE — Telephone Encounter (Signed)
-----   Message from Riki Altes, MD sent at 11/19/2020  9:19 AM EST ----- Ureteral calculus not seen on KUB.  Recommend scheduling follow-up renal ultrasound.  Order was entered and will [place] with results.

## 2020-11-20 NOTE — Telephone Encounter (Signed)
Send mychart message

## 2020-12-06 ENCOUNTER — Ambulatory Visit
Admission: RE | Admit: 2020-12-06 | Discharge: 2020-12-06 | Disposition: A | Discharge status: Home / Self Care | Payer: No Typology Code available for payment source | Source: Ambulatory Visit | Attending: Urology | Admitting: Urology

## 2020-12-06 ENCOUNTER — Other Ambulatory Visit: Payer: Self-pay

## 2020-12-06 DIAGNOSIS — N132 Hydronephrosis with renal and ureteral calculous obstruction: Secondary | ICD-10-CM

## 2020-12-07 ENCOUNTER — Encounter: Payer: Self-pay | Admitting: Urology

## 2020-12-11 ENCOUNTER — Telehealth: Payer: Self-pay | Admitting: *Deleted

## 2020-12-11 NOTE — Telephone Encounter (Signed)
Patient notified and voiced understanding.

## 2020-12-11 NOTE — Telephone Encounter (Signed)
-----   Message from Riki Altes, MD sent at 12/09/2020 12:06 PM EST ----- Renal ultrasound showed resolution of blockage consistent with a passed calculus.  Please have him [place] should he have recurrent pain

## 2021-01-23 ENCOUNTER — Encounter: Payer: Self-pay | Admitting: Family Medicine

## 2021-01-23 ENCOUNTER — Other Ambulatory Visit: Payer: Self-pay

## 2021-01-23 ENCOUNTER — Ambulatory Visit: Payer: No Typology Code available for payment source | Admitting: Family Medicine

## 2021-01-23 VITALS — BP 132/82 | HR 94 | Temp 97.9°F | Ht 77.0 in | Wt 337.2 lb

## 2021-01-23 DIAGNOSIS — E538 Deficiency of other specified B group vitamins: Secondary | ICD-10-CM | POA: Diagnosis not present

## 2021-01-23 DIAGNOSIS — R5383 Other fatigue: Secondary | ICD-10-CM | POA: Diagnosis not present

## 2021-01-23 DIAGNOSIS — Z9989 Dependence on other enabling machines and devices: Secondary | ICD-10-CM

## 2021-01-23 DIAGNOSIS — E785 Hyperlipidemia, unspecified: Secondary | ICD-10-CM

## 2021-01-23 DIAGNOSIS — F321 Major depressive disorder, single episode, moderate: Secondary | ICD-10-CM

## 2021-01-23 DIAGNOSIS — G4733 Obstructive sleep apnea (adult) (pediatric): Secondary | ICD-10-CM

## 2021-01-23 DIAGNOSIS — R7989 Other specified abnormal findings of blood chemistry: Secondary | ICD-10-CM

## 2021-01-23 DIAGNOSIS — R29898 Other symptoms and signs involving the musculoskeletal system: Secondary | ICD-10-CM | POA: Diagnosis not present

## 2021-01-23 NOTE — Patient Instructions (Addendum)
Return at your convenience fasting for blood work first thing in the morning.  Continue health diet and lifestyle changes - goal is building on small healthy changes steps for sustainability.  Good to see you today.  Schedule physical without labs over next 1-2 months

## 2021-01-23 NOTE — Assessment & Plan Note (Signed)
Notes BLE weakness, L>R, present for over a year.  Reassuringly physical exam without obvious weakness noted.  Will check labwork including ESR, CPK for further evaluation.

## 2021-01-23 NOTE — Assessment & Plan Note (Signed)
Has backed off sweetened beverages, started keto diet, with resultant ~17 lb weight loss this past year. Motivated to continue sustainable healthy diet and lifestyle changes.  Contrave unaffordable.

## 2021-01-23 NOTE — Assessment & Plan Note (Addendum)
Longstanding, check labwork for reversible causes of fatigue including testosterone levels (h/o low T in chart).

## 2021-01-23 NOTE — Assessment & Plan Note (Signed)
May benefit from return to counseling. Start with labwork as above.

## 2021-01-23 NOTE — Assessment & Plan Note (Signed)
Update levels off replacement. 

## 2021-01-23 NOTE — Assessment & Plan Note (Signed)
Has restarted CPAP without signifciant benefit noted. Endorses daytime somnolence - may need pressures calibrated.

## 2021-01-23 NOTE — Assessment & Plan Note (Signed)
H/o low T levels in chart.  Update next early morning labwork.  He does endorse depressed mood as well as marked fatigue but not decreased libiod.
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
## 2021-01-24 ENCOUNTER — Ambulatory Visit: Payer: No Typology Code available for payment source | Admitting: Family Medicine

## 2021-02-14 ENCOUNTER — Other Ambulatory Visit: Payer: Self-pay

## 2021-02-14 ENCOUNTER — Other Ambulatory Visit (INDEPENDENT_AMBULATORY_CARE_PROVIDER_SITE_OTHER): Payer: No Typology Code available for payment source

## 2021-02-14 DIAGNOSIS — R5383 Other fatigue: Secondary | ICD-10-CM

## 2021-02-14 DIAGNOSIS — R29898 Other symptoms and signs involving the musculoskeletal system: Secondary | ICD-10-CM | POA: Diagnosis not present

## 2021-02-14 DIAGNOSIS — E785 Hyperlipidemia, unspecified: Secondary | ICD-10-CM

## 2021-02-14 DIAGNOSIS — E538 Deficiency of other specified B group vitamins: Secondary | ICD-10-CM

## 2021-02-14 DIAGNOSIS — R7989 Other specified abnormal findings of blood chemistry: Secondary | ICD-10-CM

## 2021-02-14 NOTE — Addendum Note (Signed)
Addended by: Alvina Chou on: 02/14/2021 08:10 AM   Modules accepted: Orders

## 2021-02-14 NOTE — Addendum Note (Signed)
Addended by: Riya Huxford J on: 02/14/2021 08:10 AM   Modules accepted: Orders  

## 2021-02-14 NOTE — Addendum Note (Signed)
Addended by: Alvina Chou on: 02/14/2021 08:09 AM   Modules accepted: Orders

## 2021-02-14 NOTE — Addendum Note (Signed)
Addended by: Debie Ashline J on: 02/14/2021 08:09 AM   Modules accepted: Orders  

## 2021-02-18 LAB — COMPREHENSIVE METABOLIC PANEL
AG Ratio: 1.4 (calc) (ref 1.0–2.5)
ALT: 13 U/L (ref 9–46)
AST: 15 U/L (ref 10–35)
Albumin: 4.2 g/dL (ref 3.6–5.1)
Alkaline phosphatase (APISO): 80 U/L (ref 35–144)
BUN: 25 mg/dL (ref 7–25)
CO2: 23 mmol/L (ref 20–32)
Calcium: 9.1 mg/dL (ref 8.6–10.3)
Chloride: 108 mmol/L (ref 98–110)
Creat: 0.88 mg/dL (ref 0.70–1.25)
Globulin: 2.9 g/dL (calc) (ref 1.9–3.7)
Glucose, Bld: 93 mg/dL (ref 65–99)
Potassium: 4.1 mmol/L (ref 3.5–5.3)
Sodium: 140 mmol/L (ref 135–146)
Total Bilirubin: 0.7 mg/dL (ref 0.2–1.2)
Total Protein: 7.1 g/dL (ref 6.1–8.1)

## 2021-02-18 LAB — CBC WITH DIFFERENTIAL/PLATELET
Absolute Monocytes: 439 cells/uL (ref 200–950)
Basophils Absolute: 61 cells/uL (ref 0–200)
Basophils Relative: 1 %
Eosinophils Absolute: 299 cells/uL (ref 15–500)
Eosinophils Relative: 4.9 %
HCT: 42.8 % (ref 38.5–50.0)
Hemoglobin: 14.3 g/dL (ref 13.2–17.1)
Lymphs Abs: 1098 cells/uL (ref 850–3900)
MCH: 30.4 pg (ref 27.0–33.0)
MCHC: 33.4 g/dL (ref 32.0–36.0)
MCV: 91.1 fL (ref 80.0–100.0)
MPV: 9 fL (ref 7.5–12.5)
Monocytes Relative: 7.2 %
Neutro Abs: 4203 cells/uL (ref 1500–7800)
Neutrophils Relative %: 68.9 %
Platelets: 200 10*3/uL (ref 140–400)
RBC: 4.7 10*6/uL (ref 4.20–5.80)
RDW: 13.3 % (ref 11.0–15.0)
Total Lymphocyte: 18 %
WBC: 6.1 10*3/uL (ref 3.8–10.8)

## 2021-02-18 LAB — LIPID PANEL
Cholesterol: 162 mg/dL (ref <200)
HDL: 37 mg/dL — ABNORMAL LOW (ref >=40)
LDL Cholesterol (Calc): 111 mg/dL (calc) — ABNORMAL HIGH
Non-HDL Cholesterol (Calc): 125 mg/dL (calc) (ref <130)
Total CHOL/HDL Ratio: 4.4 (calc) (ref <5.0)
Triglycerides: 57 mg/dL (ref <150)

## 2021-02-18 LAB — VITAMIN B12: Vitamin B-12: 420 pg/mL (ref 200–1100)

## 2021-02-18 LAB — TESTOS,TOTAL,FREE AND SHBG (FEMALE)
Free Testosterone: 37.4 pg/mL (ref 35.0–155.0)
Sex Hormone Binding: 45 nmol/L (ref 22–77)
Testosterone, Total, LC-MS-MS: 331 ng/dL (ref 250–1100)

## 2021-02-18 LAB — IRON, TOTAL/TOTAL IRON BINDING CAP
%SAT: 33 % (calc) (ref 20–48)
Iron: 83 ug/dL (ref 50–180)
TIBC: 251 mcg/dL (calc) (ref 250–425)

## 2021-02-18 LAB — TSH: TSH: 2.59 mIU/L (ref 0.40–4.50)

## 2021-02-18 LAB — CK: Total CK: 84 U/L (ref 44–196)

## 2021-02-18 LAB — SEDIMENTATION RATE: Sed Rate: 19 mm/h (ref 0–20)

## 2021-02-18 LAB — VITAMIN D 25 HYDROXY (VIT D DEFICIENCY, FRACTURES): Vit D, 25-Hydroxy: 41 ng/mL (ref 30–100)

## 2021-02-18 LAB — FERRITIN: Ferritin: 154 ng/mL (ref 24–380)

## 2021-02-21 ENCOUNTER — Encounter: Payer: Self-pay | Admitting: Family Medicine

## 2021-02-21 ENCOUNTER — Ambulatory Visit: Payer: No Typology Code available for payment source | Admitting: Family Medicine

## 2021-02-21 ENCOUNTER — Other Ambulatory Visit: Payer: Self-pay

## 2021-02-21 VITALS — BP 128/82 | HR 86 | Temp 97.7°F | Ht 75.0 in | Wt 327.2 lb

## 2021-02-21 DIAGNOSIS — E785 Hyperlipidemia, unspecified: Secondary | ICD-10-CM

## 2021-02-21 DIAGNOSIS — Z1211 Encounter for screening for malignant neoplasm of colon: Secondary | ICD-10-CM

## 2021-02-21 DIAGNOSIS — Z125 Encounter for screening for malignant neoplasm of prostate: Secondary | ICD-10-CM

## 2021-02-21 DIAGNOSIS — E538 Deficiency of other specified B group vitamins: Secondary | ICD-10-CM

## 2021-02-21 DIAGNOSIS — Z9989 Dependence on other enabling machines and devices: Secondary | ICD-10-CM

## 2021-02-21 DIAGNOSIS — F321 Major depressive disorder, single episode, moderate: Secondary | ICD-10-CM

## 2021-02-21 DIAGNOSIS — Z0001 Encounter for general adult medical examination with abnormal findings: Secondary | ICD-10-CM

## 2021-02-21 DIAGNOSIS — N401 Enlarged prostate with lower urinary tract symptoms: Secondary | ICD-10-CM

## 2021-02-21 DIAGNOSIS — R5383 Other fatigue: Secondary | ICD-10-CM

## 2021-02-21 DIAGNOSIS — R7989 Other specified abnormal findings of blood chemistry: Secondary | ICD-10-CM

## 2021-02-21 DIAGNOSIS — Z23 Encounter for immunization: Secondary | ICD-10-CM | POA: Diagnosis not present

## 2021-02-21 DIAGNOSIS — G4733 Obstructive sleep apnea (adult) (pediatric): Secondary | ICD-10-CM

## 2021-02-21 DIAGNOSIS — R29898 Other symptoms and signs involving the musculoskeletal system: Secondary | ICD-10-CM

## 2021-02-21 DIAGNOSIS — R351 Nocturia: Secondary | ICD-10-CM

## 2021-02-21 LAB — PSA: PSA: 1.83 ng/mL (ref 0.10–4.00)

## 2021-02-21 NOTE — Assessment & Plan Note (Signed)
Ongoing. See below.  

## 2021-02-21 NOTE — Patient Instructions (Addendum)
We will refer you back to GI for colonoscopy Consider shingrix vaccine  Labs today  Good to see you today Steve Lynch with questions Return as needed or in 1 year for next physical.  We will refer you to PT for treatment course.   Health Maintenance, Male Adopting a healthy lifestyle and getting preventive care are important in promoting health and wellness. Ask your health care provider about:  The right schedule for you to have regular tests and exams.  Things you can do on your own to prevent diseases and keep yourself healthy. What should I know about diet, weight, and exercise? Eat a healthy diet  Eat a diet that includes plenty of vegetables, fruits, low-fat dairy products, and lean protein.  Do not eat a lot of foods that are high in solid fats, added sugars, or sodium.   Maintain a healthy weight Body mass index (BMI) is a measurement that can be used to identify possible weight problems. It estimates body fat based on height and weight. Your health care provider can help determine your BMI and help you achieve or maintain a healthy weight. Get regular exercise Get regular exercise. This is one of the most important things you can do for your health. Most adults should:  Exercise for at least 150 minutes each week. The exercise should increase your heart rate and make you sweat (moderate-intensity exercise).  Do strengthening exercises at least twice a week. This is in addition to the moderate-intensity exercise.  Spend less time sitting. Even light physical activity can be beneficial. Watch cholesterol and blood lipids Have your blood tested for lipids and cholesterol at 63 years of age, then have this test every 5 years. You may need to have your cholesterol levels checked more often if:  Your lipid or cholesterol levels are high.  You are older than 63 years of age.  You are at high risk for heart disease. What should I know about cancer screening? Many types of cancers  can be detected early and may often be prevented. Depending on your health history and family history, you may need to have cancer screening at various ages. This may include screening for:  Colorectal cancer.  Prostate cancer.  Skin cancer.  Lung cancer. What should I know about heart disease, diabetes, and high blood pressure? Blood pressure and heart disease  High blood pressure causes heart disease and increases the risk of stroke. This is more likely to develop in people who have high blood pressure readings, are of African descent, or are overweight.  Talk with your health care provider about your target blood pressure readings.  Have your blood pressure checked: ? Every 3-5 years if you are 68-27 years of age. ? Every year if you are 56 years old or older.  If you are between the ages of 89 and 92 and are a current or former smoker, ask your health care provider if you should have a one-time screening for abdominal aortic aneurysm (AAA). Diabetes Have regular diabetes screenings. This checks your fasting blood sugar level. Have the screening done:  Once every three years after age 104 if you are at a normal weight and have a low risk for diabetes.  More often and at a younger age if you are overweight or have a high risk for diabetes. What should I know about preventing infection? Hepatitis B If you have a higher risk for hepatitis B, you should be screened for this virus. Talk with your health  care provider to find out if you are at risk for hepatitis B infection. Hepatitis C Blood testing is recommended for:  Everyone born from [birth year] through 1963.  Anyone with known risk factors for hepatitis C. Sexually transmitted infections (STIs)  You should be screened each year for STIs, including gonorrhea and chlamydia, if: ? You are sexually active and are younger than 63 years of age. ? You are older than 63 years of age and your health care provider tells you that you are at  risk for this type of infection. ? Your sexual activity has changed since you were last screened, and you are at increased risk for chlamydia or gonorrhea. Ask your health care provider if you are at risk.  Ask your health care provider about whether you are at high risk for HIV. Your health care provider may recommend a prescription medicine to help prevent HIV infection. If you choose to take medicine to prevent HIV, you should first get tested for HIV. You should then be tested every 3 months for as long as you are taking the medicine. Follow these instructions at home: Lifestyle  Do not use any products that contain nicotine or tobacco, such as cigarettes, e-cigarettes, and chewing tobacco. If you need help quitting, ask your health care provider.  Do not use street drugs.  Do not share needles.  Ask your health care provider for help if you need support or information about quitting drugs. Alcohol use  Do not drink alcohol if your health care provider tells you not to drink.  If you drink alcohol: ? Limit how much you have to 0-2 drinks a day. ? Be aware of how much alcohol is in your drink. In the [place] one drink equals one 12 oz bottle of beer (355 mL), one 5 oz glass of wine (148 mL), or one 1 oz glass of hard liquor (44 mL). General instructions  Schedule regular health, dental, and eye exams.  Stay current with your vaccines.  Tell your health care provider if: ? You often feel depressed. ? You have ever been abused or do not feel safe at home. Summary  Adopting a healthy lifestyle and getting preventive care are important in promoting health and wellness.  Follow your health care provider's instructions about healthy diet, exercising, and getting tested or screened for diseases.  Follow your health care provider's instructions on monitoring your cholesterol and blood pressure. This information is not intended to replace advice given to you by your health care  provider. Make sure you discuss any questions you have with your health care provider. Document Revised: 09/23/2018 Document Reviewed: 09/23/2018 Elsevier Patient Education  2021 [employer].

## 2021-02-21 NOTE — Assessment & Plan Note (Addendum)
Not on statin. Reviewed ASCVD risk. He will continue healthy diet/lifestyle measures.  The 10-year ASCVD risk score Denman George DC Montez Hageman., et al., 2013) is: 11.2%   Values used to calculate the score:     Age: 65 years     Sex: Male     Is Non-Hispanic African American: No     Diabetic: No     Tobacco smoker: No     Systolic Blood Pressure: 128 mmHg     Is BP treated: No     HDL Cholesterol: 37 mg/dL     Total Cholesterol: 162 mg/dL

## 2021-02-21 NOTE — Assessment & Plan Note (Signed)
Preventative protocols reviewed and updated unless pt declined. Discussed healthy diet and lifestyle.  

## 2021-02-21 NOTE — Assessment & Plan Note (Signed)
Maintaining levels off replacement.  

## 2021-02-21 NOTE — Assessment & Plan Note (Signed)
Levels low normal. No treatment at this time.

## 2021-02-21 NOTE — Assessment & Plan Note (Signed)
Stable period.  

## 2021-02-21 NOTE — Assessment & Plan Note (Addendum)
Predominant concern is ongoing and persistent BLE weakness ongoing for >82yr, objectively no obvious weakness appreciated on exam. This weakness has led to recent falls.  Anticipate largely deconditioning related.  Discussed neuro eval vs PT course - will start with physical therapy, and if no benefit noted, low threshold to refer to neurology for further evaluation. He agrees with plan.

## 2021-02-21 NOTE — Progress Notes (Signed)
Patient ID: Steve Lynch, male    DOB: March 28, 1956, 65 y.o.   MRN: [id number]  This visit was conducted in person.  BP 128/82   Pulse 86   Temp 97.7 F (36.5 C) (Temporal)   Ht 6\' 3"  (1.905 m)   Wt (!) 327 lb 4 oz (148.4 kg)   SpO2 97%   BMI 40.90 kg/m    CC: CPE  Subjective:   HPI: Steve Lynch is a 65 y.o. male presenting on 02/21/2021 for Annual Exam   See prior note for details - L>R leg weakness over 1+ year worse going up stairs associated with fatigue.  Ongoing weight loss through healthy diet and lifestyle changes. Continues keto-diet over the past 2 months. Did not start walking routine. Ongoing easy fatigue.  Fell Saturday - while trying to get up off floor while working in yard. Fell on back.   Ongoing predominant concern is bilateral leg weakness, constant. Notes increased unsteadiness worse with uneven surface.  No paresthesias, numbness besides chronic RLE numbness after leg fracture, no L'hemitte sign, no muscle pain.   Preventative: COLONOSCOPY 12/2011 - TAx1, diverticulosis, rpt 5 yrs (Dr 01/2012 @ [facility]) - discussed options. Will refer for rpt GI.  Prostate cancer screening -declines DRE - continue yearly PSA. Denies BPH symptoms.  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 Interim medical history since our last visit reviewed. Allergies and medications reviewed and  updated. No outpatient medications prior to visit.   No facility-administered medications prior to visit.     Per HPI unless specifically indicated in ROS section below Review of Systems  Constitutional: Negative for activity change, appetite change, chills, fatigue, fever and unexpected weight change.  HENT: Negative for hearing loss.   Eyes: Negative for visual disturbance.  Respiratory: Negative for cough, chest tightness, shortness of breath and wheezing.   Cardiovascular: Negative for chest pain, palpitations and leg swelling.  Gastrointestinal: Negative for abdominal distention, abdominal pain, blood in stool, constipation, diarrhea, nausea and vomiting.  Genitourinary: Negative for difficulty urinating and hematuria.  Musculoskeletal: Positive for neck pain. Negative for arthralgias and myalgias.  Skin: Negative for rash.  Neurological: Negative for dizziness, seizures, syncope and headaches.  Hematological: Negative for adenopathy. Does not bruise/bleed easily.  Psychiatric/Behavioral: Negative for dysphoric mood. The patient is not nervous/anxious.    Objective:  BP 128/82   Pulse 86   Temp 97.7 F (36.5 C) (Temporal)   Ht 6\' 3"  (1.905 m)   Wt (!) 327 lb 4 oz (148.4 kg)   SpO2 97%   BMI 40.90 kg/m   Wt Readings from Last 3 Encounters:  02/21/21 (!) 327 lb 4 oz (148.4 kg)  01/23/21 (!) 337 lb 4 oz (153 kg)  11/16/20 (!) 340 lb (154.2 kg)      Physical Exam Vitals and nursing note reviewed.  Constitutional:  General: He is not in acute distress.    Appearance: Normal appearance. He is well-developed. He is not ill-appearing.  HENT:     Head: Normocephalic and atraumatic.     Right Ear: Hearing, tympanic membrane, ear canal and external ear normal.     Left Ear: Hearing, tympanic membrane, ear canal and external ear normal.  Eyes:     General: No scleral icterus.    Extraocular Movements: Extraocular movements intact.     Conjunctiva/sclera: Conjunctivae  normal.     Pupils: Pupils are equal, round, and reactive to light.  Neck:     Thyroid: No thyroid mass or thyromegaly.     Vascular: No carotid bruit.  Cardiovascular:     Rate and Rhythm: Normal rate and regular rhythm.     Pulses: Normal pulses.          Radial pulses are 2+ on the right side and 2+ on the left side.     Heart sounds: Normal heart sounds. No murmur heard.   Pulmonary:     Effort: Pulmonary effort is normal. No respiratory distress.     Breath sounds: Normal breath sounds. No wheezing, rhonchi or rales.  Abdominal:     General: Bowel sounds are normal. There is no distension.     Palpations: Abdomen is soft. There is no mass.     Tenderness: There is no abdominal tenderness. There is no guarding or rebound.     Hernia: No hernia is present.  Musculoskeletal:        General: Normal range of motion.     Cervical back: Normal range of motion and neck supple.     Right lower leg: No edema.     Left lower leg: No edema.  Lymphadenopathy:     Cervical: No cervical adenopathy.  Skin:    General: Skin is warm and dry.     Findings: No rash.  Neurological:     General: No focal deficit present.     Mental Status: He is alert and oriented to person, place, and time.     Sensory: Sensation is intact.     Motor: Motor function is intact.     Coordination: Coordination is intact.     Comments:  CN grossly intact, station and gait intact 5/5 strength BUE, BLE  Psychiatric:        Mood and Affect: Mood normal.        Behavior: Behavior normal.        Thought Content: Thought content normal.        Judgment: Judgment normal.       Results for orders placed or performed in visit on 02/14/21  Iron,Total/Total Iron Binding Cap  Result Value Ref Range   Iron 83 50 - 180 mcg/dL   TIBC 295 621 - 308 mcg/dL (calc)   %SAT 33 20 - 48 % (calc)  Ferritin  Result Value Ref Range   Ferritin 154 24 - 380 ng/mL  Vitamin B12  Result Value Ref Range   Vitamin B-12 420 200  - 1,100 pg/mL  VITAMIN D 25 Hydroxy (Vit-D Deficiency, Fractures)  Result Value Ref Range   Vit D, 25-Hydroxy 41 30 - 100 ng/mL  Comprehensive metabolic panel  Result Value Ref Range   Glucose, Bld 93 65 - 99 mg/dL   BUN 25 7 - 25 mg/dL   Creat 6.57 8.46 - 9.62 mg/dL   BUN/Creatinine Ratio NOT APPLICABLE 6 - 22 (calc)   Sodium 140  135 - 146 mmol/L   Potassium 4.1 3.5 - 5.3 mmol/L   Chloride 108 98 - 110 mmol/L   CO2 23 20 - 32 mmol/L   Calcium 9.1 8.6 - 10.3 mg/dL   Total Protein 7.1 6.1 - 8.1 g/dL   Albumin 4.2 3.6 - 5.1 g/dL   Globulin 2.9 1.9 - 3.7 g/dL (calc)   AG Ratio 1.4 1.0 - 2.5 (calc)   Total Bilirubin 0.7 0.2 - 1.2 mg/dL   Alkaline phosphatase (APISO) 80 35 - 144 U/L   AST 15 10 - 35 U/L   ALT 13 9 - 46 U/L  TSH  Result Value Ref Range   TSH 2.59 0.40 - 4.50 mIU/L  CBC with Differential/Platelet  Result Value Ref Range   WBC 6.1 3.8 - 10.8 Thousand/uL   RBC 4.70 4.20 - 5.80 Million/uL   Hemoglobin 14.3 13.2 - 17.1 g/dL   HCT 40.942.8 81.138.5 - 91.450.0 %   MCV 91.1 80.0 - 100.0 fL   MCH 30.4 27.0 - 33.0 pg   MCHC 33.4 32.0 - 36.0 g/dL   RDW 78.213.3 95.611.0 - 21.315.0 %   Platelets 200 140 - 400 Thousand/uL   MPV 9.0 7.5 - 12.5 fL   Neutro Abs 4,203 1,500 - 7,800 cells/uL   Lymphs Abs 1,098 850 - 3,900 cells/uL   Absolute Monocytes 439 200 - 950 cells/uL   Eosinophils Absolute 299 15 - 500 cells/uL   Basophils Absolute 61 0 - 200 cells/uL   Neutrophils Relative % 68.9 %   Total Lymphocyte 18.0 %   Monocytes Relative 7.2 %   Eosinophils Relative 4.9 %   Basophils Relative 1.0 %  Lipid panel  Result Value Ref Range   Cholesterol 162 <200 mg/dL   HDL 37 (L) > OR = 40 mg/dL   Triglycerides 57 <086<150 mg/dL   LDL Cholesterol (Calc) 111 (H) mg/dL (calc)   Total CHOL/HDL Ratio 4.4 <5.0 (calc)   Non-HDL Cholesterol (Calc) 125 <130 mg/dL (calc)  CK  Result Value Ref Range   Total CK 84 44 - 196 U/L  Sedimentation rate  Result Value Ref Range   Sed Rate 19 0 - 20 mm/h   Testos,Total,Free and SHBG (Male)  Result Value Ref Range   Testosterone, Total, LC-MS-MS 331 250 - 1,100 ng/dL   Free Testosterone 57.837.4 35.0 - 155.0 pg/mL   Sex Hormone Binding 45 22 - 77 nmol/L   Lab Results  Component Value Date   PSA 1.58 01/12/2020   PSA 1.11 08/09/2016   [facility] 02/21/2021 01/23/2021 01/19/2020 04/02/2017  Decreased Interest 1 2 2 2   Down, Depressed, Hopeless 1 1 1 2   PHQ - 2 Score 2 3 3 4   Altered sleeping 1 3 3 3   Tired, decreased energy 1 3 2 3   Change in appetite 1 0 2 3  Feeling bad or failure about yourself  1 2 3 3   Trouble concentrating 0 0 0 1  Moving slowly or fidgety/restless 0 0 0 0  Suicidal thoughts 0 0 0 1  PHQ-9 Score 6 11 13 18   Difficult doing work/chores - - - Extremely dIfficult    GAD 7 : Generalized Anxiety Score 02/21/2021 01/23/2021 01/19/2020  Nervous, Anxious, on Edge 0 1 0  Control/stop worrying 0 1 1  Worry too much - different things 1 1 1   Trouble relaxing 0 0 0  Restless 0 0 0  Easily annoyed or irritable 1 1 2   Afraid - awful  might happen 1 1 2   Total GAD 7 Score 3 5 6    Assessment & Plan:  This visit occurred during the SARS-CoV-2 public health emergency.  Safety protocols were in place, including screening questions prior to the visit, additional usage of staff PPE, and extensive cleaning of exam room while observing appropriate contact time as indicated for disinfecting solutions.   Problem List Items Addressed This Visit    Fatigue    Ongoing. See below.       Encounter for general adult medical examination with abnormal findings - Primary    Preventative protocols reviewed and updated unless pt declined. Discussed healthy diet and lifestyle.       Vitamin B12 deficiency    Maintaining levels off replacement.       Dyslipidemia    Not on statin. Reviewed ASCVD risk. He will continue healthy diet/lifestyle measures.  The 10-year ASCVD risk score DC ., et al., 2013) is: 11.2%   Values  used to calculate the score:     Age: 24 years     Sex: Male     Is Non-Hispanic African American: No     Diabetic: No     Tobacco smoker: No     Systolic Blood Pressure: 128 mmHg     Is BP treated: No     HDL Cholesterol: 37 mg/dL     Total Cholesterol: 162 mg/dL       OSA on CPAP    Since restarting CPAP notes marked improvement in GERD control.       Obesity, morbid, BMI 40.0-49.9 (HCC)    Congratulated on ongoing weight loss to date - he is motivated to continue modified keto diet. Encouraged incorporating exercise into routine.  Total ~25lbs over the last 2 months.       BPH associated with nocturia    Stable period       MDD (major depressive disorder), single episode, moderate (HCC)    Stable period off medication.  He continues bible study which helps.       Low testosterone in male    Levels low normal. No treatment at this time.       Weakness of both lower extremities    Predominant concern is ongoing and persistent BLE weakness ongoing for >110yr, objectively no obvious weakness appreciated on exam. This weakness has led to recent falls.  Anticipate largely deconditioning related.  Discussed neuro eval vs PT course - will start with physical therapy, and if no benefit noted, low threshold to refer to neurology for further evaluation. He agrees with plan.        Other Visit Diagnoses    Special screening for malignant neoplasms, colon       Relevant Orders   Ambulatory referral to Gastroenterology   Special screening for malignant neoplasm of prostate       Relevant Orders   PSA   Need for shingles vaccine       Relevant Orders   Varicella-zoster vaccine IM (Completed)       No orders of the defined types were placed in this encounter.  Orders Placed This Encounter  Procedures  . Varicella-zoster vaccine IM  . PSA  . Ambulatory referral to Gastroenterology    Referral Priority:   Routine    Referral Type:   Consultation    Referral Reason:    Specialty Services Required    Number of Visits Requested:   1    Patient instructions: We will  refer you back to GI for colonoscopy Consider shingrix vaccine  Labs today  Good to see you today Kirk[place] with questions Return as needed or in 1 year for next physical.  We will refer you to PT for treatment course.   Follow up plan: Return in about 1 year (around 02/21/2022) for annual exam, prior fasting for blood work.  [name], MD

## 2021-02-21 NOTE — Assessment & Plan Note (Addendum)
Congratulated on ongoing weight loss to date - he is motivated to continue modified keto diet. Encouraged incorporating exercise into routine.  Total ~25lbs over the last 2 months.

## 2021-02-21 NOTE — Assessment & Plan Note (Signed)
Since restarting CPAP notes marked improvement in GERD control.

## 2021-02-21 NOTE — Assessment & Plan Note (Signed)
Stable period off medication.  He continues bible study which helps.

## 2021-02-23 ENCOUNTER — Ambulatory Visit: Payer: No Typology Code available for payment source | Admitting: Family Medicine

## 2021-03-20 ENCOUNTER — Encounter: Payer: Self-pay | Admitting: *Deleted

## 2021-03-27 ENCOUNTER — Encounter: Payer: No Typology Code available for payment source | Admitting: Family Medicine

## 2021-05-30 ENCOUNTER — Ambulatory Visit: Payer: No Typology Code available for payment source

## 2021-06-12 ENCOUNTER — Telehealth: Payer: Self-pay

## 2021-06-12 NOTE — Telephone Encounter (Signed)
Pt left v/m; pt had annual exam and received shingles vaccine on 02/21/21. Pt wants to know when he should come in again for  2nd shngles shot. pt also gets c pap supplies from [facility] and pt wants to change supplier of c pap supplies. Pt request cb.

## 2021-06-20 ENCOUNTER — Other Ambulatory Visit: Payer: Self-pay

## 2021-06-20 ENCOUNTER — Ambulatory Visit (INDEPENDENT_AMBULATORY_CARE_PROVIDER_SITE_OTHER): Payer: No Typology Code available for payment source

## 2021-06-20 DIAGNOSIS — Z23 Encounter for immunization: Secondary | ICD-10-CM

## 2021-06-20 NOTE — Progress Notes (Signed)
Per orders of Dr. Sharen Hones, injection of Zoster was given by Nyra Capes CMA. Patient tolerated injection well.

## 2021-11-07 ENCOUNTER — Other Ambulatory Visit: Payer: Self-pay

## 2021-11-07 ENCOUNTER — Ambulatory Visit
Admission: RE | Admit: 2021-11-07 | Discharge: 2021-11-07 | Disposition: A | Discharge status: Home / Self Care | Payer: No Typology Code available for payment source | Source: Ambulatory Visit | Attending: Urgent Care | Admitting: Urgent Care

## 2021-11-07 VITALS — BP 129/74 | HR 103 | Temp 99.0°F | Resp 18

## 2021-11-07 DIAGNOSIS — J069 Acute upper respiratory infection, unspecified: Secondary | ICD-10-CM | POA: Diagnosis not present

## 2021-11-07 MED ORDER — BENZONATATE 100 MG PO CAPS: 100.0000 mg | ORAL_CAPSULE | Freq: Three times a day (TID) | ORAL | 0 refills | Status: DC | PRN

## 2021-11-07 MED ORDER — CETIRIZINE HCL 10 MG PO TABS: 10.0000 mg | ORAL_TABLET | Freq: Every day | ORAL | 0 refills | Status: DC

## 2021-11-07 MED ORDER — PREDNISONE 50 MG PO TABS: 50.0000 mg | ORAL_TABLET | Freq: Every day | ORAL | 0 refills | Status: DC

## 2021-11-07 MED ORDER — PROMETHAZINE-DM 6.25-15 MG/5ML PO SYRP: 5.0000 mL | ORAL_SOLUTION | Freq: Every evening | ORAL | 0 refills | Status: DC | PRN

## 2021-11-07 NOTE — ED Provider Notes (Signed)
Steve Lynch   MRN: [id number] DOB: 28-Jul-1956  Subjective:   Steve Lynch is a 66 y.o. male with pmh of OSA on CPAP presenting for 3 day history of acute onset persistent and worsening cough that is eliciting soreness on either side of his ribs and belly.  Reports that been difficult to get a cough.  He did have a head cold last week and improved with his sinuses.  Has had multiple sick contacts.  No history of COVID 19 in the past 6 months. No history of respiratory disorders. Has pmh of drug abuse with cocaine and had to go to rehab for this.   No current facility-administered medications for this encounter. No current outpatient medications on file.   Allergies  Allergen Reactions   Oxycodone Rash    Past Medical History:  Diagnosis Date   Crushing injury of left thumb 09/15/2017   Drug abuse in remission (HCC) 1990s   Cocaine with rehab   Dyslipidemia    mild   Fracture of tibia with fibula, closed, right, sequela 10/16/2017   Peripheral vascular disease (HCC) 2004   varicose veins right leg   Tendonitis 10/2010   temporal tendonitis with locked jaw following tooth extraction   Vitamin B12 deficiency 09/2013     Past Surgical History:  Procedure Laterality Date   BACK SURGERY  2005   "DISC  REPLACEMENT"   L5-6    COLONOSCOPY  12/2011   TAx1, diverticulosis, rpt 5 yrs - no further colonoscopy done yet (Dr Myra Gianotti @ [facility])   KNEE ARTHROPLASTY Left 2012   TONSILLECTOMY     TOTAL KNEE ARTHROPLASTY  11/12/2011   Procedure: TOTAL KNEE ARTHROPLASTY;  Surgeon: Shelda Pal, MD; Laterality: Right   TOTAL KNEE ARTHROPLASTY Bilateral    VARICOSE VEIN SURGERY Right    sclerotherapy    Family History  Problem Relation Age of Onset   Hyperlipidemia Mother    Hypertension Mother    Diabetes Mother    ALS Sister    Cancer Neg Hx    CAD Neg Hx    Stroke Neg Hx     Social History   Tobacco Use   Smoking status: Never   Smokeless tobacco:  Never  Substance Use Topics   Alcohol use: Yes    Comment: Very rare   Drug use: No    Comment: COCAINE ADDICTION WITH REHAB   21 YEARS AGO    ROS   Objective:   Vitals: BP 129/74    Pulse (!) 103    Temp 99 F (37.2 C)    Resp 18    SpO2 98%   Physical Exam Constitutional:      General: He is not in acute distress.    Appearance: Normal appearance. He is well-developed. He is not ill-appearing, toxic-appearing or diaphoretic.  HENT:     Head: Normocephalic and atraumatic.     Right Ear: External ear normal.     Left Ear: External ear normal.     Nose: Nose normal.     Mouth/Throat:     Mouth: Mucous membranes are moist.  Eyes:     General: No scleral icterus.       Right eye: No discharge.        Left eye: No discharge.     Extraocular Movements: Extraocular movements intact.  Cardiovascular:     Rate and Rhythm: Normal rate and regular rhythm.     Heart sounds: Normal heart sounds. No  murmur heard.   No friction rub. No gallop.  Pulmonary:     Effort: Pulmonary effort is normal. No respiratory distress.     Breath sounds: Normal breath sounds. No stridor. No wheezing, rhonchi or rales.  Neurological:     Mental Status: He is alert and oriented to person, place, and time.  Psychiatric:        Mood and Affect: Mood normal.        Behavior: Behavior normal.        Thought Content: Thought content normal.    Assessment and Plan :   PDMP not reviewed this encounter.  1. Viral URI with cough    Deferred imaging given clear cardiopulmonary exam, hemodynamically stable vital signs.  Patient refused a COVID test.  States that he will use what he has at home.  Recommended aggressive management so that he does not continue having problems with a cough.  I offered him cough suppression medications and a steroid course.  Recommend supportive care otherwise for suspected viral upper respiratory infection with a cough. Counseled patient on potential for adverse effects with  medications prescribed/recommended today, ER and return-to-clinic precautions discussed, patient verbalized understanding.    Wallis Bamberg, PA-C 11/07/21 1314

## 2021-11-07 NOTE — Discharge Instructions (Addendum)
We will manage this as a viral syndrome. For sore throat or cough try using a honey-based tea. Use 3 teaspoons of honey with juice squeezed from half lemon. Place shaved pieces of ginger into 1/2-1 cup of water and warm over stove top. Then mix the ingredients and repeat every 4 hours as needed. Please take Tylenol 500mg -650mg  once every 6 hours for fevers, aches and pains. Hydrate very well with at least 2 liters (64 ounces) of water. Eat light meals such as soups (chicken and noodles, chicken wild rice, vegetable).  Do not eat any foods that you are allergic to.  Start an antihistamine like Zyrtec for postnasal drainage, sinus congestion.  You can take this together with a steroid course, prednisone, and use the cough medications as needed.

## 2021-11-07 NOTE — ED Triage Notes (Signed)
Pt here with cough, sore throat, headache and congestion x 2 days. No fevers.

## 2021-11-16 ENCOUNTER — Ambulatory Visit
Admission: EM | Admit: 2021-11-16 | Discharge: 2021-11-16 | Disposition: A | Discharge status: Home / Self Care | Payer: No Typology Code available for payment source | Attending: Family Medicine | Admitting: Family Medicine

## 2021-11-16 DIAGNOSIS — R053 Chronic cough: Secondary | ICD-10-CM

## 2021-11-16 DIAGNOSIS — J209 Acute bronchitis, unspecified: Secondary | ICD-10-CM | POA: Diagnosis not present

## 2021-11-16 MED ORDER — DOXYCYCLINE HYCLATE 100 MG PO CAPS: 100.0000 mg | ORAL_CAPSULE | Freq: Two times a day (BID) | ORAL | 0 refills | Status: DC

## 2021-11-16 MED ORDER — PREDNISONE 20 MG PO TABS: 20.0000 mg | ORAL_TABLET | Freq: Every day | ORAL | 0 refills | Status: AC

## 2021-11-16 NOTE — ED Provider Notes (Signed)
[facility]    CSN: [id number] Arrival date & time: 11/16/21  1708      History   Chief Complaint No chief complaint on file.   HPI Steve Lynch is a 66 y.o. male.   HPI Patient presents today for re-evaluation of persistent cough. [facility] for same symptoms, prescribed prednisone and anti-cough medication which he completed and briefly symptoms improved , offered a COVID test and subsequently declined treatment. Patient is a non-smoker, however, has history significant for morbid obesity, OSA on CPAP. Denies fever. Past Medical History:  Diagnosis Date   Crushing injury of left thumb 09/15/2017   Drug abuse in remission (HCC) 1990s   Cocaine with rehab   Dyslipidemia    mild   Fracture of tibia with fibula, closed, right, sequela 10/16/2017   Peripheral vascular disease (HCC) 2004   varicose veins right leg   Tendonitis 10/2010   temporal tendonitis with locked jaw following tooth extraction   Vitamin B12 deficiency 09/2013    Patient Active Problem List   Diagnosis Date Noted   Low testosterone in male 01/23/2021   Weakness of both lower extremities 01/23/2021   Chronic heel pain, left 07/10/2020   Subconjunctival hemorrhage of left eye 01/22/2020   Paresthesias 01/22/2020   Intertrigo 01/22/2020   Fracture of tibia with fibula, closed, right, sequela 10/16/2017   Essential tremor 10/14/2017   MDD (major depressive disorder), single episode, moderate (HCC) 04/02/2017   OSA on CPAP 08/13/2016   Obesity, morbid, BMI 40.0-49.9 (HCC) 08/13/2016   BPH associated with nocturia 08/13/2016   Leg edema, right 08/13/2016   Dyslipidemia    Encounter for general adult medical examination with abnormal findings 10/01/2013   Vitamin B12 deficiency 09/13/2013   Chronic cough 08/16/2013   Fatigue 08/16/2013    Past Surgical History:  Procedure Laterality Date   BACK SURGERY  2005   "DISC  REPLACEMENT"   L5-6    COLONOSCOPY  12/2011    TAx1, diverticulosis, rpt 5 yrs - no further colonoscopy done yet (Dr [name] @ [facility])   KNEE ARTHROPLASTY Left 2012   TONSILLECTOMY     TOTAL KNEE ARTHROPLASTY  11/12/2011   Procedure: TOTAL KNEE ARTHROPLASTY;  Surgeon: Shelda PalMatthew D Olin, MD; Laterality: Right   TOTAL KNEE ARTHROPLASTY Bilateral    VARICOSE VEIN SURGERY Right    sclerotherapy       Home Medications    Prior to Admission medications   Medication Sig Start Date End Date Taking? Authorizing Provider  benzonatate (TESSALON) 100 MG capsule Take 1-2 capsules (100-200 mg total) by mouth 3 (three) times daily as needed for cough. 11/07/21   Wallis BambergMani, Mario, PA-C  cetirizine (ZYRTEC ALLERGY) 10 MG tablet Take 1 tablet (10 mg total) by mouth daily. 11/07/21   Wallis BambergMani, Mario, PA-C  predniSONE (DELTASONE) 50 MG tablet Take 1 tablet (50 mg total) by mouth daily with breakfast. 11/07/21   Wallis BambergMani, Mario, PA-C  promethazine-dextromethorphan (PROMETHAZINE-DM) 6.25-15 MG/5ML syrup Take 5 mLs by mouth at bedtime as needed for cough. 11/07/21   Wallis BambergMani, Mario, PA-C    Family History Family History  Problem Relation Age of Onset   Hyperlipidemia Mother    Hypertension Mother    Diabetes Mother    ALS Sister    Cancer Neg Hx    CAD Neg Hx    Stroke Neg Hx     Social History Social History   Tobacco Use   Smoking status: Never  Smokeless tobacco: Never  Substance Use Topics   Alcohol use: Yes    Comment: Very rare   Drug use: No    Comment: COCAINE ADDICTION WITH REHAB   21 YEARS AGO     Allergies   Oxycodone Review of Systems Review of Systems Pertinent negatives listed in HPI   Physical Exam Triage Vital Signs ED Triage Vitals  Enc Vitals Group     BP      Pulse      Resp      Temp      Temp src      SpO2      Weight      Height      Head Circumference      Peak Flow      Pain Score      Pain Loc      Pain Edu?      Excl. in GC?    No data found.  Updated Vital Signs There were no vitals taken  for this visit.  Visual Acuity Right Eye Distance:   Left Eye Distance:   Bilateral Distance:    Right Eye Near:   Left Eye Near:    Bilateral Near:     Physical Exam Constitutional:      Appearance: Normal appearance.  HENT:     Head: Normocephalic and atraumatic.     Nose: Rhinorrhea present. No congestion.     Mouth/Throat:     Pharynx: Oropharyngeal exudate present. No posterior oropharyngeal erythema.  Eyes:     Extraocular Movements: Extraocular movements intact.     Pupils: Pupils are equal, round, and reactive to light.  Cardiovascular:     Rate and Rhythm: Normal rate and regular rhythm.  Pulmonary:     Effort: Pulmonary effort is normal.     Breath sounds: Rhonchi present.  Musculoskeletal:        General: Normal range of motion.  Skin:    Capillary Refill: Capillary refill takes less than 2 seconds.  Neurological:     General: No focal deficit present.     Mental Status: He is alert and oriented to person, place, and time.  Psychiatric:        Mood and Affect: Mood normal.        Behavior: Behavior normal.        Thought Content: Thought content normal.        Judgment: Judgment normal.     UC Treatments / Results  Labs (all labs ordered are listed, but only abnormal results are displayed) Labs Reviewed - No data to display  EKG   Radiology No results found.  Procedures Procedures (including critical care time)  Medications Ordered in UC Medications - No data to display  Initial Impression / Assessment and Plan / UC Course  I have reviewed the triage vital signs and the nursing notes.  Pertinent labs & imaging results that were available during my care of the patient were reviewed by me and considered in my medical decision making (see chart for details).    Acute bronchitis with persistent cough We will trial doxycycline 100 mg twice daily for 10 days along with repeat a lower dose of prednisone treatment for total 5 days.  Patient will  continue cough suppressant therapy with Promethazine DM and benzonatate Perles at home.  Patient is asked to follow-up if symptoms worsen or do not improve with PCP or return here for evaluation. Final Clinical Impressions(s) / UC Diagnoses  Final diagnoses:  None   Discharge Instructions   None    ED Prescriptions   None    PDMP not reviewed this encounter.   Bing Neighbors, FNP 11/16/21 [phone]

## 2021-11-16 NOTE — ED Triage Notes (Signed)
Pt returning d/t on-going cough that has not improved. Seen 1/25 dx w/Viral URI with cough  Pt was given meds, Zyrtec, prednisone, tessalon, & promethazine  Pt reports mostly dry cough, worse in the evening.

## 2022-02-02 ENCOUNTER — Other Ambulatory Visit: Payer: Self-pay | Admitting: Family Medicine

## 2022-02-02 DIAGNOSIS — E785 Hyperlipidemia, unspecified: Secondary | ICD-10-CM

## 2022-02-02 DIAGNOSIS — N401 Enlarged prostate with lower urinary tract symptoms: Secondary | ICD-10-CM

## 2022-02-02 DIAGNOSIS — Z0000 Encounter for general adult medical examination without abnormal findings: Secondary | ICD-10-CM

## 2022-02-02 DIAGNOSIS — E538 Deficiency of other specified B group vitamins: Secondary | ICD-10-CM

## 2022-02-13 ENCOUNTER — Other Ambulatory Visit (INDEPENDENT_AMBULATORY_CARE_PROVIDER_SITE_OTHER): Payer: No Typology Code available for payment source

## 2022-02-13 DIAGNOSIS — N401 Enlarged prostate with lower urinary tract symptoms: Secondary | ICD-10-CM

## 2022-02-13 DIAGNOSIS — E538 Deficiency of other specified B group vitamins: Secondary | ICD-10-CM

## 2022-02-13 DIAGNOSIS — E785 Hyperlipidemia, unspecified: Secondary | ICD-10-CM

## 2022-02-13 DIAGNOSIS — Z79899 Other long term (current) drug therapy: Secondary | ICD-10-CM

## 2022-02-14 LAB — COMPREHENSIVE METABOLIC PANEL
AG Ratio: 1.3 (calc) (ref 1.0–2.5)
ALT: 11 U/L (ref 9–46)
AST: 12 U/L (ref 10–35)
Albumin: 3.9 g/dL (ref 3.6–5.1)
Alkaline phosphatase (APISO): 84 U/L (ref 35–144)
BUN: 20 mg/dL (ref 7–25)
CO2: 24 mmol/L (ref 20–32)
Calcium: 8.9 mg/dL (ref 8.6–10.3)
Chloride: 110 mmol/L (ref 98–110)
Creat: 0.94 mg/dL (ref 0.70–1.35)
Globulin: 3 g/dL (calc) (ref 1.9–3.7)
Glucose, Bld: 96 mg/dL (ref 65–99)
Potassium: 4.4 mmol/L (ref 3.5–5.3)
Sodium: 143 mmol/L (ref 135–146)
Total Bilirubin: 0.6 mg/dL (ref 0.2–1.2)
Total Protein: 6.9 g/dL (ref 6.1–8.1)

## 2022-02-14 LAB — PSA: PSA: 1.99 ng/mL (ref <=4.00)

## 2022-02-14 LAB — VITAMIN B12: Vitamin B-12: 375 pg/mL (ref 200–1100)

## 2022-02-14 LAB — LIPID PANEL
Cholesterol: 150 mg/dL (ref <200)
HDL: 40 mg/dL (ref >=40)
LDL Cholesterol (Calc): 96 mg/dL (calc)
Non-HDL Cholesterol (Calc): 110 mg/dL (calc) (ref <130)
Total CHOL/HDL Ratio: 3.8 (calc) (ref <5.0)
Triglycerides: 49 mg/dL (ref <150)

## 2022-02-19 ENCOUNTER — Encounter: Payer: Self-pay | Admitting: Family Medicine

## 2022-02-19 ENCOUNTER — Ambulatory Visit (INDEPENDENT_AMBULATORY_CARE_PROVIDER_SITE_OTHER): Payer: No Typology Code available for payment source | Admitting: Family Medicine

## 2022-02-19 VITALS — BP 122/82 | HR 80 | Temp 97.9°F | Ht 74.75 in | Wt 343.2 lb

## 2022-02-19 DIAGNOSIS — Z7189 Other specified counseling: Secondary | ICD-10-CM

## 2022-02-19 DIAGNOSIS — Z23 Encounter for immunization: Secondary | ICD-10-CM

## 2022-02-19 DIAGNOSIS — R29898 Other symptoms and signs involving the musculoskeletal system: Secondary | ICD-10-CM

## 2022-02-19 DIAGNOSIS — R5383 Other fatigue: Secondary | ICD-10-CM

## 2022-02-19 DIAGNOSIS — Z9989 Dependence on other enabling machines and devices: Secondary | ICD-10-CM

## 2022-02-19 DIAGNOSIS — E785 Hyperlipidemia, unspecified: Secondary | ICD-10-CM

## 2022-02-19 DIAGNOSIS — Z1211 Encounter for screening for malignant neoplasm of colon: Secondary | ICD-10-CM

## 2022-02-19 DIAGNOSIS — F325 Major depressive disorder, single episode, in full remission: Secondary | ICD-10-CM

## 2022-02-19 DIAGNOSIS — Z0000 Encounter for general adult medical examination without abnormal findings: Secondary | ICD-10-CM | POA: Diagnosis not present

## 2022-02-19 DIAGNOSIS — G4733 Obstructive sleep apnea (adult) (pediatric): Secondary | ICD-10-CM

## 2022-02-19 DIAGNOSIS — E538 Deficiency of other specified B group vitamins: Secondary | ICD-10-CM | POA: Diagnosis not present

## 2022-02-19 NOTE — Assessment & Plan Note (Signed)
Chronic, ongoing.  TSH and CBC normal last year.  Labs overall reassuring today.  I did recommend he follow-up with pulmonology for recheck on his CPAP device. ?

## 2022-02-19 NOTE — Assessment & Plan Note (Signed)
This resolved on its own 

## 2022-02-19 NOTE — Assessment & Plan Note (Signed)
Low normal levels-recommend start 500 to 1000 mcg daily or every other day OTC. ?

## 2022-02-19 NOTE — Assessment & Plan Note (Signed)
Chronic off statin. ?The 10-year ASCVD risk score (Arnett DK, et al., 2019) is: 10.1% ?  Values used to calculate the score: ?    Age: 66 years ?    Sex: Male ?    Is Non-Hispanic African American: No ?    Diabetic: No ?    Tobacco smoker: No ?    Systolic Blood Pressure: 122 mmHg ?    Is BP treated: No ?    HDL Cholesterol: 40 mg/dL ?    Total Cholesterol: 150 mg/dL ?

## 2022-02-19 NOTE — Assessment & Plan Note (Signed)
Preventative protocols reviewed and updated unless pt declined. Discussed healthy diet and lifestyle.  

## 2022-02-19 NOTE — Assessment & Plan Note (Signed)
Discussed weight gain noted as well as relation with sleep apnea.  He is considering restarting ketogenic diet which she previously successfully lost weight on. ?

## 2022-02-19 NOTE — Patient Instructions (Addendum)
We will refer you back to GI.  ?Tdap today (tetanus and whooping cough).  ?Bring [place] a copy of your living will to update your chart.  ?[address] Dr Trena Platt office to schedule follow up visit [phone] ?Start vitamin B12 500-1034mcg daily over the counter.  ?Return as needed or in 1 year for next physical.  ? ?Health Maintenance, Male ?Adopting a healthy lifestyle and getting preventive care are important in promoting health and wellness. Ask your health care provider about: ?The right schedule for you to have regular tests and exams. ?Things you can do on your own to prevent diseases and keep yourself healthy. ?What should I know about diet, weight, and exercise? ?Eat a healthy diet ? ?Eat a diet that includes plenty of vegetables, fruits, low-fat dairy products, and lean protein. ?Do not eat a lot of foods that are high in solid fats, added sugars, or sodium. ?Maintain a healthy weight ?Body mass index (BMI) is a measurement that can be used to identify possible weight problems. It estimates body fat based on height and weight. Your health care provider can help determine your BMI and help you achieve or maintain a healthy weight. ?Get regular exercise ?Get regular exercise. This is one of the most important things you can do for your health. Most adults should: ?Exercise for at least 150 minutes each week. The exercise should increase your heart rate and make you sweat (moderate-intensity exercise). ?Do strengthening exercises at least twice a week. This is in addition to the moderate-intensity exercise. ?Spend less time sitting. Even light physical activity can be beneficial. ?Watch cholesterol and blood lipids ?Have your blood tested for lipids and cholesterol at 64 years of age, then have this test every 5 years. ?You may need to have your cholesterol levels checked more often if: ?Your lipid or cholesterol levels are high. ?You are older than 64 years of age. ?You are at high risk for heart disease. ?What  should I know about cancer screening? ?Many types of cancers can be detected early and may often be prevented. Depending on your health history and family history, you may need to have cancer screening at various ages. This may include screening for: ?Colorectal cancer. ?Prostate cancer. ?Skin cancer. ?Lung cancer. ?What should I know about heart disease, diabetes, and high blood pressure? ?Blood pressure and heart disease ?High blood pressure causes heart disease and increases the risk of stroke. This is more likely to develop in people who have high blood pressure readings or are overweight. ?Talk with your health care provider about your target blood pressure readings. ?Have your blood pressure checked: ?Every 3-5 years if you are 57-79 years of age. ?Every year if you are 57 years old or older. ?If you are between the ages of 40 and 31 and are a current or former smoker, ask your health care provider if you should have a one-time screening for abdominal aortic aneurysm (AAA). ?Diabetes ?Have regular diabetes screenings. This checks your fasting blood sugar level. Have the screening done: ?Once every three years after age 7 if you are at a normal weight and have a low risk for diabetes. ?More often and at a younger age if you are overweight or have a high risk for diabetes. ?What should I know about preventing infection? ?Hepatitis B ?If you have a higher risk for hepatitis B, you should be screened for this virus. Talk with your health care provider to find out if you are at risk for hepatitis B infection. ?  Hepatitis C ?Blood testing is recommended for: ?Everyone born from [birth year] through 1963. ?Anyone with known risk factors for hepatitis C. ?Sexually transmitted infections (STIs) ?You should be screened each year for STIs, including gonorrhea and chlamydia, if: ?You are sexually active and are younger than 64 years of age. ?You are older than 64 years of age and your health care provider tells you that you are  at risk for this type of infection. ?Your sexual activity has changed since you were last screened, and you are at increased risk for chlamydia or gonorrhea. Ask your health care provider if you are at risk. ?Ask your health care provider about whether you are at high risk for HIV. Your health care provider may recommend a prescription medicine to help prevent HIV infection. If you choose to take medicine to prevent HIV, you should first get tested for HIV. You should then be tested every 3 months for as long as you are taking the medicine. ?Follow these instructions at home: ?Alcohol use ?Do not drink alcohol if your health care provider tells you not to drink. ?If you drink alcohol: ?Limit how much you have to 0-2 drinks a day. ?Know how much alcohol is in your drink. In the [place] one drink equals one 12 oz bottle of beer (355 mL), one 5 oz glass of wine (148 mL), or one 1? oz glass of hard liquor (44 mL). ?Lifestyle ?Do not use any products that contain nicotine or tobacco. These products include cigarettes, chewing tobacco, and vaping devices, such as e-cigarettes. If you need help quitting, ask your health care provider. ?Do not use street drugs. ?Do not share needles. ?Ask your health care provider for help if you need support or information about quitting drugs. ?General instructions ?Schedule regular health, dental, and eye exams. ?Stay current with your vaccines. ?Tell your health care provider if: ?You often feel depressed. ?You have ever been abused or do not feel safe at home. ?Summary ?Adopting a healthy lifestyle and getting preventive care are important in promoting health and wellness. ?Follow your health care provider's instructions about healthy diet, exercising, and getting tested or screened for diseases. ?Follow your health care provider's instructions on monitoring your cholesterol and blood pressure. ?This information is not intended to replace advice given to you by your health care provider.  Make sure you discuss any questions you have with your health care provider. ?Document Revised: 02/19/2021 Document Reviewed: 02/19/2021 ?Elsevier Patient Education ? 2023 Elsevier Inc. ? ?

## 2022-02-19 NOTE — Assessment & Plan Note (Signed)
Stable period off medication.  

## 2022-02-19 NOTE — Assessment & Plan Note (Signed)
Continues CPAP, although notices ongoing fatigue.  Overdue for follow-up with Dr. Wynona Neat.  Advised [place] and schedule, number provided. ?

## 2022-02-19 NOTE — Progress Notes (Signed)
? ? Patient ID: Steve Lynch, male    DOB: March 17, 1956, 66 y.o.   MRN: [id number] ? ?This visit was conducted in person. ? ?BP 122/82   Pulse 80   Temp 97.9 ?F (36.6 ?C) (Temporal)   Ht 6' 2.75" (1.899 m)   Wt (!) 343 lb 4 oz (155.7 kg)   SpO2 96%   BMI 43.19 kg/m?   ? ?CC: CPE ?Subjective:  ? ?HPI: ?Steve Lynch is a 66 y.o. male presenting on 02/19/2022 for Annual Exam ? ? ?He states insurance covers 1 CPE per calendar year.  ?Planning to get medicare.  ? ?Bilateral lower extremity weakness evaluated last year-referred to physical therapy outpatient course.  We also had discussed neurology referral if not improved. He never did PT. Leg weakness subsequently got better.  ? ?Now having neck issues leading to cervicogenic headaches. H/o ACDF. He's completed PT and steroid injection into neck. Discussing surgery. He's been managing with ibuprofen and excedrin.  ? ?Weight gain noted. Previous success following "Dirty Keto" diet. He has soft drinks over the past year.  ? ?OSA - recently restarted CPAP machine. Has seen plm.  ? ?Preventative: ?COLONOSCOPY 12/2011 - TAx1, diverticulosis, rpt 5 yrs (Dr Lula Olszewski @ [facility]) - discussed options. Referred to GI last year, they were unable to reach patient despite phone calls and letter.  ?Prostate cancer screening - declines DRE - continue yearly PSA. Denies BPH symptoms.  ?Lung cancer screening - not eligible  ?Flu shot - declines  ?COVID vaccine Pfizer 12/2019, 01/2020, booster 09/2020 ?Tetanus - declines  ?Shingrix - 02/2021, 06/2021 ?Advanced directive - has this at home. Wife [name] then son are HCPOA. Asked to bring [place] copy of living will.  ?Seat belt use discussed  ?Sunscreen use discussed, no changing moles on skin, has seen derm.  ?Non smoker  ?Alcohol - rare  ?Dentist - LTR Q3 mo  ?Eye exam - yearly  ? 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   ? ?Relevant past medical, surgical, family and social history reviewed and updated as indicated. Interim medical history since our last visit reviewed. ?Allergies and medications reviewed and updated. ?Outpatient Medications Prior to Visit  ?Medication Sig Dispense Refill  ? benzonatate (TESSALON) 100 MG capsule Take 1-2 capsules (100-200 mg total) by mouth 3 (three) times daily as needed for cough. 60 capsule 0  ? cetirizine (ZYRTEC ALLERGY) 10 MG tablet Take 1 tablet (10 mg total) by mouth daily. 30 tablet 0  ? doxycycline (VIBRAMYCIN) 100 MG capsule Take 1 capsule (100 mg total) by mouth 2 (two) times daily. 20 capsule 0  ? promethazine-dextromethorphan (PROMETHAZINE-DM) 6.25-15 MG/5ML syrup Take 5 mLs by mouth at bedtime as needed for cough. 100 mL 0  ? ?No facility-administered medications prior to visit.  ?  ? ?Per HPI unless specifically indicated in ROS section below ?Review of Systems  ?Constitutional:  Negative for activity change, appetite change, chills, fatigue, fever and unexpected weight change.  ?HENT:  Negative for hearing loss.   ?Eyes:  Negative for visual disturbance.  ?Respiratory:  Negative for cough, chest tightness, shortness of breath and wheezing.   ?Cardiovascular:  Negative for chest pain, palpitations and leg swelling.  ?Gastrointestinal:  Negative for abdominal distention, abdominal pain, blood in stool, constipation, diarrhea, nausea and vomiting.  ?Genitourinary:  Negative for difficulty urinating and hematuria.  ?  Musculoskeletal:  Negative for arthralgias, myalgias and neck pain.  ?Skin:  Negative for rash.  ?Neurological:  Negative for dizziness, seizures, syncope and headaches.  ?Hematological:  Negative for adenopathy. Does not bruise/bleed easily.  ?Psychiatric/Behavioral:  Negative for dysphoric mood. The patient is not nervous/anxious.   ? ?Objective:  ?BP 122/82   Pulse 80   Temp 97.9 ?F (36.6 ?C) (Temporal)   Ht 6' 2.75" (1.899 m)   Wt (!) 343  lb 4 oz (155.7 kg)   SpO2 96%   BMI 43.19 kg/m?   ?Wt Readings from Last 3 Encounters:  ?02/19/22 (!) 343 lb 4 oz (155.7 kg)  ?02/21/21 (!) 327 lb 4 oz (148.4 kg)  ?01/23/21 (!) 337 lb 4 oz (153 kg)  ?  ?  ?Physical Exam ?Vitals and nursing note reviewed.  ?Constitutional:   ?   General: He is not in acute distress. ?   Appearance: Normal appearance. He is well-developed. He is not ill-appearing.  ?HENT:  ?   Head: Normocephalic and atraumatic.  ?   Right Ear: Hearing, tympanic membrane, ear canal and external ear normal.  ?   Left Ear: Hearing, tympanic membrane, ear canal and external ear normal.  ?Eyes:  ?   General: No scleral icterus. ?   Extraocular Movements: Extraocular movements intact.  ?   Conjunctiva/sclera: Conjunctivae normal.  ?   Pupils: Pupils are equal, round, and reactive to light.  ?Neck:  ?   Thyroid: No thyroid mass or thyromegaly.  ?   Vascular: No carotid bruit.  ?Cardiovascular:  ?   Rate and Rhythm: Normal rate and regular rhythm.  ?   Pulses: Normal pulses.     ?     Radial pulses are 2+ on the right side and 2+ on the left side.  ?   Heart sounds: Normal heart sounds. No murmur heard. ?Pulmonary:  ?   Effort: Pulmonary effort is normal. No respiratory distress.  ?   Breath sounds: Normal breath sounds. No wheezing, rhonchi or rales.  ?Abdominal:  ?   General: Bowel sounds are normal. There is no distension.  ?   Palpations: Abdomen is soft. There is no mass.  ?   Tenderness: There is no abdominal tenderness. There is no guarding or rebound.  ?   Hernia: No hernia is present.  ?Musculoskeletal:     ?   General: Normal range of motion.  ?   Cervical back: Normal range of motion and neck supple.  ?   Right lower leg: No edema.  ?   Left lower leg: No edema.  ?Lymphadenopathy:  ?   Cervical: No cervical adenopathy.  ?Skin: ?   General: Skin is warm and dry.  ?   Findings: No rash.  ?Neurological:  ?   General: No focal deficit present.  ?   Mental Status: He is alert and oriented to person,  place, and time.  ?Psychiatric:     ?   Mood and Affect: Mood normal.     ?   Behavior: Behavior normal.     ?   Thought Content: Thought content normal.     ?   Judgment: Judgment normal.  ? ?   ?Results for orders placed or performed in visit on 02/13/22  ?Vitamin B12  ?Result Value Ref Range  ? Vitamin B-12 375 200 - 1,100 pg/mL  ?PSA  ?Result Value Ref Range  ? PSA 1.99 < OR = 4.00 ng/mL  ?Lipid panel  ?Result  Value Ref Range  ? Cholesterol 150 <200 mg/dL  ? HDL 40 > OR = 40 mg/dL  ? Triglycerides 49 <150 mg/dL  ? LDL Cholesterol (Calc) 96 mg/dL (calc)  ? Total CHOL/HDL Ratio 3.8 <5.0 (calc)  ? Non-HDL Cholesterol (Calc) 110 <130 mg/dL (calc)  ?Comprehensive metabolic panel  ?Result Value Ref Range  ? Glucose, Bld 96 65 - 99 mg/dL  ? BUN 20 7 - 25 mg/dL  ? Creat 0.94 0.70 - 1.35 mg/dL  ? BUN/Creatinine Ratio NOT APPLICABLE 6 - 22 (calc)  ? Sodium 143 135 - 146 mmol/L  ? Potassium 4.4 3.5 - 5.3 mmol/L  ? Chloride 110 98 - 110 mmol/L  ? CO2 24 20 - 32 mmol/L  ? Calcium 8.9 8.6 - 10.3 mg/dL  ? Total Protein 6.9 6.1 - 8.1 g/dL  ? Albumin 3.9 3.6 - 5.1 g/dL  ? Globulin 3.0 1.9 - 3.7 g/dL (calc)  ? AG Ratio 1.3 1.0 - 2.5 (calc)  ? Total Bilirubin 0.6 0.2 - 1.2 mg/dL  ? Alkaline phosphatase (APISO) 84 35 - 144 U/L  ? AST 12 10 - 35 U/L  ? ALT 11 9 - 46 U/L  ? ?Lab Results  ?Component Value Date  ? TSH 2.59 02/14/2021  ?  ?Lab Results  ?Component Value Date  ? WBC 6.1 02/14/2021  ? HGB 14.3 02/14/2021  ? HCT 42.8 02/14/2021  ? MCV 91.1 02/14/2021  ? PLT 200 02/14/2021  ?  ?Assessment & Plan:  ? ?Problem List Items Addressed This Visit   ? ? Health maintenance examination - Primary (Chronic)  ?  Preventative protocols reviewed and updated unless pt declined. ?Discussed healthy diet and lifestyle.  ? ?  ?  ? Advanced directives, counseling/discussion (Chronic)  ?  Advanced directive - has this at home. Wife [name] then son are HCPOA. Asked to bring [place] copy of living will.  ?  ?  ? Fatigue  ?  Chronic, ongoing.  TSH and CBC  normal last year.  Labs overall reassuring today.  I did recommend he follow-up with pulmonology for recheck on his CPAP device. ? ?  ?  ? Vitamin B12 deficiency  ?  Low normal levels-recommend start 500 to 1000 mcg daily or

## 2022-02-19 NOTE — Assessment & Plan Note (Signed)
Advanced directive - has this at home. Wife Tina then son are HCPOA. Asked to bring us copy of living will.  

## 2022-03-04 ENCOUNTER — Emergency Department
Admission: EM | Admit: 2022-03-04 | Discharge: 2022-03-04 | Disposition: A | Discharge status: Home / Self Care | Payer: No Typology Code available for payment source | Attending: Emergency Medicine | Admitting: Emergency Medicine

## 2022-03-04 ENCOUNTER — Telehealth: Payer: Self-pay | Admitting: Family Medicine

## 2022-03-04 ENCOUNTER — Encounter: Payer: Self-pay | Admitting: Radiology

## 2022-03-04 ENCOUNTER — Emergency Department: Payer: No Typology Code available for payment source

## 2022-03-04 ENCOUNTER — Other Ambulatory Visit: Payer: Self-pay

## 2022-03-04 DIAGNOSIS — E860 Dehydration: Secondary | ICD-10-CM | POA: Insufficient documentation

## 2022-03-04 DIAGNOSIS — D72829 Elevated white blood cell count, unspecified: Secondary | ICD-10-CM | POA: Insufficient documentation

## 2022-03-04 DIAGNOSIS — N200 Calculus of kidney: Secondary | ICD-10-CM | POA: Insufficient documentation

## 2022-03-04 DIAGNOSIS — R1032 Left lower quadrant pain: Secondary | ICD-10-CM

## 2022-03-04 DIAGNOSIS — N289 Disorder of kidney and ureter, unspecified: Secondary | ICD-10-CM

## 2022-03-04 LAB — CBC
HCT: 43 % (ref 39.0–52.0)
Hemoglobin: 14.4 g/dL (ref 13.0–17.0)
MCH: 30.3 pg (ref 26.0–34.0)
MCHC: 33.5 g/dL (ref 30.0–36.0)
MCV: 90.3 fL (ref 80.0–100.0)
Platelets: 214 10*3/uL (ref 150–400)
RBC: 4.76 MIL/uL (ref 4.22–5.81)
RDW: 12.4 % (ref 11.5–15.5)
WBC: 14.2 10*3/uL — ABNORMAL HIGH (ref 4.0–10.5)
nRBC: 0 % (ref 0.0–0.2)

## 2022-03-04 LAB — COMPREHENSIVE METABOLIC PANEL
ALT: 15 U/L (ref 0–44)
AST: 20 U/L (ref 15–41)
Albumin: 4 g/dL (ref 3.5–5.0)
Alkaline Phosphatase: 78 U/L (ref 38–126)
Anion gap: 7 (ref 5–15)
BUN: 27 mg/dL — ABNORMAL HIGH (ref 8–23)
CO2: 24 mmol/L (ref 22–32)
Calcium: 9.2 mg/dL (ref 8.9–10.3)
Chloride: 109 mmol/L (ref 98–111)
Creatinine, Ser: 1.28 mg/dL — ABNORMAL HIGH (ref 0.61–1.24)
GFR, Estimated: >60 mL/min (ref >60)
Glucose, Bld: 103 mg/dL — ABNORMAL HIGH (ref 70–99)
Potassium: 4 mmol/L (ref 3.5–5.1)
Sodium: 140 mmol/L (ref 135–145)
Total Bilirubin: 0.8 mg/dL (ref 0.3–1.2)
Total Protein: 7.9 g/dL (ref 6.5–8.1)

## 2022-03-04 LAB — URINALYSIS, ROUTINE W REFLEX MICROSCOPIC
Bacteria, UA: NONE SEEN
Bilirubin Urine: NEGATIVE
Glucose, UA: NEGATIVE mg/dL
Ketones, ur: NEGATIVE mg/dL
Leukocytes,Ua: NEGATIVE
Nitrite: NEGATIVE
Protein, ur: NEGATIVE mg/dL
RBC / HPF: >50 RBC/hpf — ABNORMAL HIGH (ref 0–5)
Specific Gravity, Urine: 1.021 (ref 1.005–1.030)
Squamous Epithelial / HPF: NONE SEEN (ref 0–5)
pH: 5 (ref 5.0–8.0)

## 2022-03-04 LAB — LIPASE, BLOOD: Lipase: 39 U/L (ref 11–51)

## 2022-03-04 MED ORDER — KETOROLAC TROMETHAMINE 30 MG/ML IJ SOLN
15.0000 mg | Freq: Once | INTRAMUSCULAR | Status: AC
  Administered 2022-03-04: 15 mg via INTRAVENOUS
  Filled 2022-03-04: qty 1

## 2022-03-04 MED ORDER — ONDANSETRON HCL 4 MG/2ML IJ SOLN: 4.0000 mg | Freq: Once | INTRAMUSCULAR | Status: DC

## 2022-03-04 MED ORDER — HYDROCODONE-ACETAMINOPHEN 5-325 MG PO TABS: 1.0000 | ORAL_TABLET | Freq: Four times a day (QID) | ORAL | 0 refills | Status: DC | PRN

## 2022-03-04 MED ORDER — HYDROMORPHONE HCL 1 MG/ML IJ SOLN: 1.0000 mg | Freq: Once | INTRAMUSCULAR | Status: DC

## 2022-03-04 MED ORDER — ONDANSETRON 4 MG PO TBDP: 4.0000 mg | ORAL_TABLET | Freq: Three times a day (TID) | ORAL | 0 refills | Status: DC | PRN

## 2022-03-04 MED ORDER — FENTANYL CITRATE PF 50 MCG/ML IJ SOSY
25.0000 ug | PREFILLED_SYRINGE | Freq: Once | INTRAMUSCULAR | Status: AC
  Administered 2022-03-04: 25 ug via INTRAVENOUS
  Filled 2022-03-04 (×2): qty 1

## 2022-03-04 MED ORDER — TAMSULOSIN HCL 0.4 MG PO CAPS: 0.4000 mg | ORAL_CAPSULE | Freq: Every day | ORAL | 0 refills | Status: AC

## 2022-03-04 MED ORDER — IBUPROFEN 600 MG PO TABS: 600.0000 mg | ORAL_TABLET | Freq: Three times a day (TID) | ORAL | 0 refills | Status: DC | PRN

## 2022-03-04 MED ORDER — IOHEXOL 300 MG/ML  SOLN
100.0000 mL | Freq: Once | INTRAMUSCULAR | Status: AC | PRN
  Administered 2022-03-04: 100 mL via INTRAVENOUS

## 2022-03-04 NOTE — Telephone Encounter (Signed)
[facility] Day - Client TELEPHONE ADVICE RECORD AccessNurse Patient Name: Steve Lynch CAL L Gender: Male DOB: [birth year] Age: 66 Y 4 M 15 D Return Phone Number: [phone] (Primary) Address: City/ State/ Zip: [place]  [address] Client [facility] Day - Client Client Site [facility] - Day Provider [name] - MD Contact Type Stahle Who Is Calling Patient / Member / Family / Caregiver [name] Type Triage / Clinical Relationship To Patient Self Return Phone Number [phone] (Primary) Chief Complaint SEVERE ABDOMINAL PAIN - Severe pain in abdomen Reason for Shenk Symptomatic / Request for Health Information Initial Comment Caller states is unable to have a BM and have strong lower left abdominal pain. Translation No Nurse Assessment Nurse: Andrey Campanile, RN, Marylene Land Date/Time Lamount Cohen Time): 03/04/2022 8:55:57 AM Confirm and document reason for Soden. If symptomatic, describe symptoms. ---Caller states is unable to have a normal BM and have strong lower left abdominal pain. Last BM 2 days ago. Pain started at 0400. No fever. Does the patient have any new or worsening symptoms? ---Yes Will a triage be completed? ---Yes Related visit to physician within the last 2 weeks? ---Yes Does the PT have any chronic conditions? (i.e. diabetes, asthma, this includes High risk factors for pregnancy, etc.) ---No Is this a behavioral health or substance abuse Szalkowski? ---No Guidelines Guideline Title Affirmed Question Affirmed Notes Nurse Date/Time Lamount Cohen Time) Abdominal Pain - Male [1] SEVERE pain (e.g., excruciating) AND [2] present > 1 hour Andrey Campanile, RN, Marylene Land 03/04/2022 8:57:21 AM Disp. Time Lamount Cohen Time) Disposition Final User 03/04/2022 8:54:03 AM Send to Urgent Queue [name] 03/04/2022 8:59:03 AM Go to ED Now Yes Andrey Campanile, RN, Marylene Land PLEASE NOTE: All timestamps contained within this report are  represented as [place] Standard Time. CONFIDENTIALTY NOTICE: This fax transmission is intended only for the addressee. It contains information that is legally privileged, confidential or otherwise protected from use or disclosure. If you are not the intended recipient, you are strictly prohibited from reviewing, disclosing, copying using or disseminating any of this information or taking any action in reliance on or regarding this information. If you have received this fax in error, please notify us immediately by telephone so that we can arrange for its return to [place]. Phone: [phone], Toll-Free: [phone], Fax: [phone] Page: 2 of 2 [name] Id: [id number] Caller Disagree/Comply Comply Caller Understands Yes PreDisposition Siebenaler Doctor Care Advice Given Per Guideline GO TO ED NOW: * You need to be seen in the Emergency Department. NOTHING BY MOUTH: * Do not eat or drink anything for now. Referrals Med [facility] - E

## 2022-03-04 NOTE — ED Provider Notes (Signed)
Emergency Medicine Provider Triage Evaluation Note  Steve Lynch , a 66 y.o. male  was evaluated in triage.  Pt complains of left lower quadrant pain for about 1 day.  Review of Systems  Positive: Pain in the left lower quadrant.  Urge to defecate but has not had a bowel movement since yesterday. Negative: Fever.  Physical Exam  BP (!) 169/86 (BP Location: Left Arm)   Pulse 71   Temp 97.8 F (36.6 C) (Oral)   Resp 18   Ht 6\' 4"  (1.93 m)   Wt (!) 152 kg   SpO2 99%   BMI 40.78 kg/m  Gen:   Awake, no distress does appear in some pain no reports it is all look in his left lower abdomen Resp:  Normal effort  MSK:   Moves extremities without difficulty  Other:  Ambulates with normal gait.  Medical Decision Making  Medically screening exam initiated at 9:39 AM.  Appropriate orders placed.  Merlon Alcorta Ausmus was informed that the remainder of the evaluation will be completed by another provider, this initial triage assessment does not replace that evaluation, and the importance of remaining in the ED until their evaluation is complete.  Discussed with patient, and examined.  Focal pain in the left lower quadrant.  He reports it is highly unusual for him, took 2 Dulcolax without relief.  Given the location of his pain and on exam he has focality in the left lower quadrant I discussed with the patient and he is agreeable to proceeding with imaging including CT scan to evaluate for other causes and etiology as to his pain such as diverticulitis constipation mass obstruction etc.  Additionally, patient would like something for pain.  Discussed with patient and he is comfortable receiving a dose of fentanyl, which I have written for.  Additionally he is agreeable not to driving home and understands he cannot drive home after taking this medication.  Reports his son works locally and will be able to pick him up   Lissa Merlin, MD 03/04/22 [phone]

## 2022-03-04 NOTE — ED Provider Notes (Signed)
[facility] Provider Note    Event Date/Time   First MD Initiated Contact with Patient 03/04/22 1002     (approximate)   History   Constipation and Abdominal Pain   HPI  Steve Lynch is a 66 y.o. male  here with left sided abd pain. Pain began fairly abruptly yesterday, has been constant though intermittently worse since then. Reports pain is in his LLQ, aching, throbbing, and associated w/ an urge to have a BM though he cannot have one/has no diarrhea. No nausea, vomiting. No urinary sx though he did notice his urine was dark yesterday. H/o kidney stones, has never required lithotripsy or stenting. No known h/o diverticulosis. Certain positions help the pain. No specific alleviating factors.       Physical Exam   Triage Vital Signs: ED Triage Vitals  Enc Vitals Group     BP 03/04/22 0930 (!) 169/86     Pulse Rate 03/04/22 0930 71     Resp 03/04/22 0930 18     Temp 03/04/22 0930 97.8 F (36.6 C)     Temp Source 03/04/22 0930 Oral     SpO2 03/04/22 0930 99 %     Weight 03/04/22 0935 (!) 335 lb (152 kg)     Height 03/04/22 0935 6\' 4"  (1.93 m)     Head Circumference --      Peak Flow --      Pain Score 03/04/22 0935 8     Pain Loc --      Pain Edu? --      Excl. in GC? --     Most recent vital signs: Vitals:   03/04/22 0930 03/04/22 1115  BP: (!) 169/86 (!) 143/92  Pulse: 71 76  Resp: 18 18  Temp: 97.8 F (36.6 C)   SpO2: 99% 98%     General: Awake, no distress.  CV:  Good peripheral perfusion.  Resp:  Normal effort. Lungs CTAB. Abd:  No distention. Minimal LLQ TTP. No rebound, guarding. Other:  MMM.    ED Results / Procedures / Treatments   Labs (all labs ordered are listed, but only abnormal results are displayed) Labs Reviewed  CBC - Abnormal; Notable for the following components:      Result Value   WBC 14.2 (*)    All other components within normal limits  COMPREHENSIVE METABOLIC PANEL - Abnormal; Notable for the  following components:   Glucose, Bld 103 (*)    BUN 27 (*)    Creatinine, Ser 1.28 (*)    All other components within normal limits  URINALYSIS, ROUTINE W REFLEX MICROSCOPIC - Abnormal; Notable for the following components:   Color, Urine YELLOW (*)    APPearance CLOUDY (*)    Hgb urine dipstick LARGE (*)    RBC / HPF >50 (*)    All other components within normal limits  LIPASE, BLOOD     EKG    RADIOLOGY CT A/P: Mild L hydro due to 3 small stones L UVJ. Mild perinephric stranding.    I also independently reviewed and agree with radiologist interpretations.   PROCEDURES:  Critical Care performed: No   MEDICATIONS ORDERED IN ED: Medications  HYDROmorphone (DILAUDID) injection 1 mg (has no administration in time range)  ondansetron (ZOFRAN) injection 4 mg (has no administration in time range)  fentaNYL (SUBLIMAZE) injection 25 mcg (25 mcg Intravenous Given 03/04/22 0949)  iohexol (OMNIPAQUE) 300 MG/ML solution 100 mL (100 mLs Intravenous Contrast Given 03/04/22 1009)  ketorolac (TORADOL) 30 MG/ML injection 15 mg (15 mg Intravenous Given 03/04/22 1036)     IMPRESSION / MDM / ASSESSMENT AND PLAN / ED COURSE  I reviewed the triage vital signs and the nursing notes.                             Ddx:  Differential includes the following, with pertinent life- or limb-threatening emergencies considered:  Renal colic, diverticulitis, colitis, obstruction, MSK pain, radiculopathy, zoster   MDM:  66 yo M here with acute, severe LLQ abd pain. Abdomen soft, NT, ND. HDS on arrival, no fevers or infectious sx. Labs show moderate leukocytosis, slight dehydration though overall are reassuring. LFTs, bili are WNL. UA with hematuria but no pyuria, no bacteria. CT a/p obtained, reviewed, shows obstructing stones left UVJ w/ hydro, no complications. Pain markedly improved after IVF, toradol. Tolerating PO. Suspect his WBC is reactive and he has no fever, chills, pyuria or bacteriuria to  suggest infection. Will dc with supportive care, outpt follow-up.   MEDICATIONS GIVEN IN ED: Medications  HYDROmorphone (DILAUDID) injection 1 mg (has no administration in time range)  ondansetron (ZOFRAN) injection 4 mg (has no administration in time range)  fentaNYL (SUBLIMAZE) injection 25 mcg (25 mcg Intravenous Given 03/04/22 0949)  iohexol (OMNIPAQUE) 300 MG/ML solution 100 mL (100 mLs Intravenous Contrast Given 03/04/22 1009)  ketorolac (TORADOL) 30 MG/ML injection 15 mg (15 mg Intravenous Given 03/04/22 1036)     Consults:  None   EMR reviewed  Reviewed prior ED notes     FINAL CLINICAL IMPRESSION(S) / ED DIAGNOSES   Final diagnoses:  Left lower quadrant abdominal pain  Nephrolithiasis     Rx / DC Orders   ED Discharge Orders          Ordered    HYDROcodone-acetaminophen (NORCO/VICODIN) 5-325 MG tablet  Every 6 hours PRN        03/04/22 1138    ibuprofen (ADVIL) 600 MG tablet  Every 8 hours PRN        03/04/22 1138    ondansetron (ZOFRAN-ODT) 4 MG disintegrating tablet  Every 8 hours PRN        03/04/22 1138             Note:  This document was prepared using Dragon voice recognition software and may include unintentional dictation errors.   Shaune Pollack, MD 03/04/22 1147

## 2022-03-04 NOTE — ED Triage Notes (Signed)
Pt here with constipation for a few days. Pt also c/o LLQ abd pain. Pt states he attempted to have a bowel movement this morning but states it was not normal. Pt denies N/V or fever.

## 2022-03-04 NOTE — Telephone Encounter (Signed)
Patient trying to have a bowel movement since 4am, is able to urinate,  Patient has lower left abdominal pain  Transferred to team health

## 2022-03-04 NOTE — Discharge Instructions (Signed)
Drink at least 6-8 glasses of water daily for the next several days  Take Ibuprofen 600 mg every 8 hours for the next 2 days, then as needed  Take the Norco as needed for severe pain  Follow-up with Urology in the next 1-2 weeks  If you develop ANY fevers, worsening pain not controlled by medications, intractable vomiting, please return to the ER immediately.

## 2022-03-04 NOTE — Telephone Encounter (Signed)
Per chart review tab pt is at [facility] ED. Sending note to Dr Sharen Hones and Misty Stanley CMA.

## 2022-03-04 NOTE — ED Notes (Signed)
Dr. Isaacs at bedside for reevaluation ?

## 2022-03-07 ENCOUNTER — Emergency Department
Admission: EM | Admit: 2022-03-07 | Discharge: 2022-03-07 | Discharge status: Against Medical Advice | Payer: No Typology Code available for payment source | Attending: Emergency Medicine | Admitting: Emergency Medicine

## 2022-03-07 ENCOUNTER — Other Ambulatory Visit: Payer: Self-pay

## 2022-03-07 DIAGNOSIS — N200 Calculus of kidney: Secondary | ICD-10-CM | POA: Insufficient documentation

## 2022-03-07 DIAGNOSIS — Z5321 Procedure and treatment not carried out due to patient leaving prior to being seen by health care provider: Secondary | ICD-10-CM | POA: Diagnosis not present

## 2022-03-07 DIAGNOSIS — R1032 Left lower quadrant pain: Secondary | ICD-10-CM | POA: Insufficient documentation

## 2022-03-07 LAB — URINALYSIS, ROUTINE W REFLEX MICROSCOPIC
Bacteria, UA: NONE SEEN
Bilirubin Urine: NEGATIVE
Glucose, UA: NEGATIVE mg/dL
Ketones, ur: NEGATIVE mg/dL
Leukocytes,Ua: NEGATIVE
Nitrite: NEGATIVE
Protein, ur: NEGATIVE mg/dL
Specific Gravity, Urine: 1.019 (ref 1.005–1.030)
pH: 5 (ref 5.0–8.0)

## 2022-03-07 LAB — BASIC METABOLIC PANEL
Anion gap: 5 (ref 5–15)
BUN: 26 mg/dL — ABNORMAL HIGH (ref 8–23)
CO2: 24 mmol/L (ref 22–32)
Calcium: 8.7 mg/dL — ABNORMAL LOW (ref 8.9–10.3)
Chloride: 112 mmol/L — ABNORMAL HIGH (ref 98–111)
Creatinine, Ser: 1.83 mg/dL — ABNORMAL HIGH (ref 0.61–1.24)
GFR, Estimated: 41 mL/min — ABNORMAL LOW (ref >60)
Glucose, Bld: 101 mg/dL — ABNORMAL HIGH (ref 70–99)
Potassium: 3.9 mmol/L (ref 3.5–5.1)
Sodium: 141 mmol/L (ref 135–145)

## 2022-03-07 LAB — CBC
HCT: 41.2 % (ref 39.0–52.0)
Hemoglobin: 13.8 g/dL (ref 13.0–17.0)
MCH: 30.4 pg (ref 26.0–34.0)
MCHC: 33.5 g/dL (ref 30.0–36.0)
MCV: 90.7 fL (ref 80.0–100.0)
Platelets: 203 10*3/uL (ref 150–400)
RBC: 4.54 MIL/uL (ref 4.22–5.81)
RDW: 12.4 % (ref 11.5–15.5)
WBC: 10.5 10*3/uL (ref 4.0–10.5)
nRBC: 0 % (ref 0.0–0.2)

## 2022-03-07 NOTE — ED Triage Notes (Signed)
Patient to ER via POV with complaints of left sided flank pain that radiates into left side of groin, reports he was diagnosed with kidney stones on Monday. Pain has worsened. Denies dysuria but is having urinary frequency. Hx of kidney stones but no hx of lithotripsy/ stent placement. Has been taking prescribed medications without relief in symptoms.

## 2022-03-08 NOTE — Telephone Encounter (Signed)
Patient seen at ER, diagnosed with kidney stone discharged with plan outpatient follow-up. Patient returned yesterday to the ER due to worsening pain but left without being seen.  Kidney function was worse on recheck-please L[place]enberg to check on patient status, ensure he has urology follow-up scheduled.

## 2022-03-08 NOTE — Telephone Encounter (Signed)
Lvm asking pt to [place] back.  Need an update on pt.  

## 2022-03-12 ENCOUNTER — Telehealth: Payer: Self-pay | Admitting: Family Medicine

## 2022-03-12 MED ORDER — TAMSULOSIN HCL 0.4 MG PO CAPS: 0.4000 mg | ORAL_CAPSULE | Freq: Every day | ORAL | 0 refills | Status: DC

## 2022-03-12 NOTE — Telephone Encounter (Signed)
Lvm asking pt to [place] back. Need an update on pt.  

## 2022-03-12 NOTE — Telephone Encounter (Signed)
See other phn note.  

## 2022-03-12 NOTE — Telephone Encounter (Signed)
Spoke with pt asking for update.  States back and side are still hurting, kind of comes and goes. Does not think he has passed stone yet so pt requests another Flomax rx (see other phn note) sent to [place], [place], [place] [address].  Also, pt confirms he has urology f/u scheduled on 03/20/20 at 1:30.

## 2022-03-12 NOTE — Addendum Note (Signed)
Addended by: [name] on: 03/12/2022 05:23 PM   Modules accepted: Orders

## 2022-03-12 NOTE — Telephone Encounter (Signed)
Pt was returning Lisa's K[place]zman, please return Helget back when possible, thanks.  Callback Number: [phone]

## 2022-03-12 NOTE — Telephone Encounter (Addendum)
Flomax sent in.  Labs from ER showed worsening kidney function concern stone is causing obstruction.  If any worsening symptoms - nausea, vomiting, unable to pass urine, worsening pain, fever, dysuria - needs to seek urgent care at [facility] I would recommend sooner urology appointment and rechecking kidney function which I have ordered.  When is he getting back into town?

## 2022-03-12 NOTE — Telephone Encounter (Signed)
Encourage patient to contact the pharmacy for refills or they can request refills through [facility]  Did the patient contact the pharmacy:  No  LAST APPOINTMENT DATE: 02/19/2022  MEDICATION: Flomax? This medication is not on the medication list  Is the patient out of medication? Yes  If not, how much is left? N/A  Is this a 90 day supply: N/A  PHARMACY: [address] [place], [place] [address] Phone Number: [phone]  Let patient know to contact pharmacy at the end of the day to make sure medication is ready.  Please notify patient to allow 48-72 hours to process

## 2022-03-12 NOTE — Telephone Encounter (Signed)
Lvm asking pt to Steve Lynch back.  Need to find out if he has resumed Flomax.  (Also, see 03/04/22 phn note).

## 2022-03-12 NOTE — Telephone Encounter (Signed)
Lvm asking pt to Steve Lynch back. Need an update on pt.  

## 2022-03-13 ENCOUNTER — Other Ambulatory Visit (INDEPENDENT_AMBULATORY_CARE_PROVIDER_SITE_OTHER): Payer: No Typology Code available for payment source

## 2022-03-13 DIAGNOSIS — N200 Calculus of kidney: Secondary | ICD-10-CM

## 2022-03-13 DIAGNOSIS — N289 Disorder of kidney and ureter, unspecified: Secondary | ICD-10-CM

## 2022-03-13 NOTE — Telephone Encounter (Signed)
Patient notified as instructed by telephone and verbalized understanding. Patient stated that he had already picked the flomax up at the pharmacy. Patient denies any symptoms other than back and side pain.Patient stated that the pain comes and goes. Patient stated that the pain was bad Monday night, but not too bad since. Patient stated that he has decided to come home today and is traveling now. Patient stated that he has an appointment scheduled at [facility] Wednesday 03/20/22. Patient is going to [facility] and see if they can put him on a cancellation list and get him before 03/20/22

## 2022-03-13 NOTE — Telephone Encounter (Signed)
Noted! Thank you

## 2022-03-13 NOTE — Telephone Encounter (Signed)
Left message on voicemail for patient or his wife to Requena the office back. Need to schedule lab work. Patient is on his way back from the beach today. Lab work already ordered by Dr. Sharen Hones.

## 2022-03-13 NOTE — Telephone Encounter (Signed)
Spoke to patient by telephone and lab appointment scheduled for today 03/13/22 at 3:00 pm.

## 2022-03-14 LAB — BASIC METABOLIC PANEL
BUN: 30 mg/dL — ABNORMAL HIGH (ref 6–23)
CO2: 24 mEq/L (ref 19–32)
Calcium: 8.8 mg/dL (ref 8.4–10.5)
Chloride: 107 mEq/L (ref 96–112)
Creatinine, Ser: 1.83 mg/dL — ABNORMAL HIGH (ref 0.40–1.50)
GFR: 38.55 mL/min — ABNORMAL LOW (ref >60.00)
Glucose, Bld: 81 mg/dL (ref 70–99)
Potassium: 4.4 mEq/L (ref 3.5–5.1)
Sodium: 140 mEq/L (ref 135–145)

## 2022-03-15 NOTE — Telephone Encounter (Signed)
Patient is calling in stating that he had a question about his Flomax prescription. Asking for a Klemz in return.

## 2022-03-15 NOTE — Telephone Encounter (Signed)
Recommend he continue flomax until he gets in to see urology.

## 2022-03-15 NOTE — Telephone Encounter (Signed)
Spoke to patient by telephone and was advised that he stopped taking the Flomax 2 days ago. Patient stated that he is not having any problems urinating. Patient stated that he went to the bathroom 4-5 times during the night and same this morning. Patient wants to know if he should start back on the Flomax or is it okay to stay off of it.

## 2022-03-15 NOTE — Telephone Encounter (Signed)
Patient notified as instructed by telephone and verbalized understanding. 

## 2022-03-18 ENCOUNTER — Other Ambulatory Visit: Payer: No Typology Code available for payment source

## 2022-03-19 ENCOUNTER — Encounter: Payer: Self-pay | Admitting: Pulmonary Disease

## 2022-03-19 ENCOUNTER — Ambulatory Visit: Payer: No Typology Code available for payment source | Admitting: Pulmonary Disease

## 2022-03-19 ENCOUNTER — Telehealth: Payer: Self-pay | Admitting: Pharmacy Technician

## 2022-03-19 ENCOUNTER — Other Ambulatory Visit (HOSPITAL_COMMUNITY): Payer: Self-pay

## 2022-03-19 VITALS — BP 126/78 | HR 76 | Temp 98.3°F | Ht 77.0 in | Wt 345.0 lb

## 2022-03-19 DIAGNOSIS — G4733 Obstructive sleep apnea (adult) (pediatric): Secondary | ICD-10-CM | POA: Diagnosis not present

## 2022-03-19 MED ORDER — ARMODAFINIL 150 MG PO TABS: 150.0000 mg | ORAL_TABLET | Freq: Every day | ORAL | 1 refills | Status: DC

## 2022-03-19 NOTE — Progress Notes (Signed)
Steve Lynch    [id number]    09/19/1956  Primary Care Physician:[name], Wynona CanesJavier, Lynch  Referring Physician: Eustaquio BoydenGutierrez, [name], Lynch [address] [place],  [place] [id number]  Chief complaint:   Patient with a past history of obstructive sleep apnea  HPI:  Patient with a history of obstructive sleep apnea diagnosed in June 2018 He did try CPAP for a while but never found a mask that fits well Stopped using CPAP after a while  Has been on CPAP Auto CPAP 5-20 Tolerating CPAP well  Does sleep about 5 hours 40 minutes every night  Wakes up feeling rejuvenated  starts feeling sleepy about 4 to 5 hours into the day  Sometimes has to go home to take a nap  usually goes to bed between 9 and 10 PM Takes him about 50 minutes to fall asleep 2-4 awakenings Final wake up time about 4:50 AM He has gained about 50 pounds with changes in his lifestyle -Weight has been stable since last visit  He does have snoring, hiccups, nonrestorative sleep Reflux symptoms are better on CPAP  No family history of obstructive sleep apnea  Outpatient Encounter Medications as of 03/19/2022  Medication Sig   tamsulosin (FLOMAX) 0.4 MG CAPS capsule Take 1 capsule (0.4 mg total) by mouth daily.   [DISCONTINUED] HYDROcodone-acetaminophen (NORCO/VICODIN) 5-325 MG tablet Take 1-2 tablets by mouth every 6 (six) hours as needed for moderate pain or severe pain (no more than 6 tabs daily).   [DISCONTINUED] ibuprofen (ADVIL) 600 MG tablet Take 1 tablet (600 mg total) by mouth every 8 (eight) hours as needed for mild pain.   [DISCONTINUED] ondansetron (ZOFRAN-ODT) 4 MG disintegrating tablet Take 1 tablet (4 mg total) by mouth every 8 (eight) hours as needed for nausea or vomiting.   No facility-administered encounter medications on file as of 03/19/2022.    Allergies as of 03/19/2022 - Review Complete 03/19/2022  Allergen Reaction Noted   Oxycodone Rash 10/15/2017    Past Medical History:   Diagnosis Date   Crushing injury of left thumb 09/15/2017   Drug abuse in remission (HCC) 1990s   Cocaine with rehab   Dyslipidemia    mild   Fracture of tibia with fibula, closed, right, sequela 10/16/2017   Peripheral vascular disease (HCC) 2004   varicose veins right leg   Tendonitis 10/2010   temporal tendonitis with locked jaw following tooth extraction   Vitamin B12 deficiency 09/2013    Past Surgical History:  Procedure Laterality Date   BACK SURGERY  2005   "DISC  REPLACEMENT"   L5-6    COLONOSCOPY  12/2011   TAx1, diverticulosis, rpt 5 yrs - no further colonoscopy done yet (Dr Steve Lynch @ [facility])   KNEE ARTHROPLASTY Left 2012   TONSILLECTOMY     TOTAL KNEE ARTHROPLASTY  11/12/2011   Procedure: TOTAL KNEE ARTHROPLASTY;  Surgeon: Shelda PalMatthew D Olin, Lynch; Laterality: Right   TOTAL KNEE ARTHROPLASTY Bilateral    VARICOSE VEIN SURGERY Right    sclerotherapy    Family History  Problem Relation Age of Onset   Hyperlipidemia Mother    Hypertension Mother    Diabetes Mother    ALS Sister    Cancer Neg Hx    CAD Neg Hx    Stroke Neg Hx     Social History   Socioeconomic History   Marital status: Married    Spouse name: Not on file   Number of  children: Not on file   Years of education: Not on file   Highest education level: Not on file  Occupational History   Occupation: Leisure centre manager [employer]  Tobacco Use   Smoking status: Never   Smokeless tobacco: Never  Vaping Use   Vaping Use: Never used  Substance and Sexual Activity   Alcohol use: Yes    Comment: Very rare   Drug use: No    Comment: COCAINE ADDICTION WITH REHAB   21 YEARS AGO   Sexual activity: Not on file  Other Topics Concern   Not on file  Social History Narrative   Lives with wife, 1 dog   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   Psychiatric/Behavioral:  Positive for sleep disturbance.    Vitals:   03/19/22 1450  BP: 126/78  Pulse: 76  Temp: 98.3 F (36.8 C)  SpO2: 97%     Physical Exam Constitutional:      Appearance: He is obese.  HENT:     Mouth/Throat:     Mouth: Mucous membranes are moist.     Comments: Mallampati 4, crowded oropharynx Eyes:     General:        Right eye: No discharge.        Left eye: No discharge.  Cardiovascular:     Rate and Rhythm: Normal rate and regular rhythm.     Heart sounds: No murmur heard.   No friction rub.  Pulmonary:     Effort: No respiratory distress.     Breath sounds: No stridor. No wheezing or rhonchi.  Musculoskeletal:     Cervical back: No rigidity or tenderness.  Neurological:     Mental Status: He is alert.  Psychiatric:        Mood and Affect: Mood normal.      08/14/2020    4:00 PM 02/27/2017    9:00 AM 08/22/2016   11:00 AM  Results of the Epworth flowsheet  Sitting and reading 3 3 3   Watching TV 3 1 3   Sitting, inactive in a public place (e.g. a theatre or a meeting) 1 3 3   As a passenger in a car for an hour without a break 0 0 0  Lying down to rest in the afternoon when circumstances permit 3 3 3   Sitting and talking to someone 2 0 0  Sitting quietly after a lunch without alcohol 3 2 3   In a car, while stopped for a few minutes in traffic 0 0 0  Total score 15 12 15     Data Reviewed: Home sleep study from 2018 reviewed showing severe obstructive sleep apnea with AHI of 63  Titration study reviewed showing he was titrated to CPAP of  12  Currently on auto CPAP 5-20 Residual AHI of 6.1 92% compliance Average use of 5 hours 40 minutes  Assessment:  Patient with history of  obstructive sleep apnea Has been using CPAP regularly  Excessive daytime sleepiness -Persists despite good compliance with CPAP -Likely has excessive daytime sleepiness despite adequate treatment of sleep disordered breathing  Obesity  Pathophysiology of sleep disordered breathing discussed Treatment options discussed  Plan/Recommendations: We will check an overnight oximetry with CPAP in place  Initiation of Nuvigil for daytime sleepiness -May need to increase or decrease in the dose depending on tolerance  Tolerating CPAP well  Follow-up in 3 months  Continue weight loss efforts   Virl Diamond Lynch [facility] 03/19/2022, 2:57 PM  CC: Steve Boyden, Lynch

## 2022-03-19 NOTE — Patient Instructions (Signed)
Prescription for Nuvigil for excessive daytime sleepiness despite adequate treatment of sleep disordered breathing  Obtain an overnight oximetry with CPAP in place  Follow-up in 3 months   [place] if Nuvigil not well-tolerated-May need to increase or decrease in the dose

## 2022-03-19 NOTE — Telephone Encounter (Signed)
Patient Advocate Encounter  Received notification from [employer] that prior authorization for ARMODAFINIL 150MG  is required.   PA submitted on 6.6.23 Key [id number] Status is pending   [facility] will continue to follow  8.6.23, CPhT Patient Advocate Phone: [phone]

## 2022-03-20 ENCOUNTER — Ambulatory Visit: Payer: No Typology Code available for payment source | Admitting: Urology

## 2022-03-20 ENCOUNTER — Encounter: Payer: Self-pay | Admitting: Urology

## 2022-03-20 VITALS — BP 141/81 | HR 93 | Ht 76.0 in | Wt 340.0 lb

## 2022-03-20 DIAGNOSIS — N23 Unspecified renal colic: Secondary | ICD-10-CM

## 2022-03-20 DIAGNOSIS — N201 Calculus of ureter: Secondary | ICD-10-CM

## 2022-03-20 DIAGNOSIS — N132 Hydronephrosis with renal and ureteral calculous obstruction: Secondary | ICD-10-CM

## 2022-03-20 LAB — URINALYSIS, COMPLETE
Bilirubin, UA: NEGATIVE
Glucose, UA: NEGATIVE
Ketones, UA: NEGATIVE
Leukocytes,UA: NEGATIVE
Nitrite, UA: NEGATIVE
Protein,UA: NEGATIVE
Specific Gravity, UA: 1.02 (ref 1.005–1.030)
Urobilinogen, Ur: 0.2 mg/dL (ref 0.2–1.0)
pH, UA: 5 (ref 5.0–7.5)

## 2022-03-20 LAB — MICROSCOPIC EXAMINATION: Bacteria, UA: NONE SEEN

## 2022-03-20 NOTE — Progress Notes (Signed)
 03/20/2022 2:01 PM   Rylyn R Sedgwick 01/19/1956 [id number]  Referring provider: [name], [name], MD [address] [place],  [place] [address]  Chief Complaint  Patient presents with   Nephrolithiasis    HPI: Khaden R Fisher is a 66 y.o. male who presents in follow-up of recent ED visit for renal colic.  [facility] ED visit 03/04/2022 with complaints of left lower quadrant abdominal pain and urge to defecate CT abdomen/pelvis with contrast showed mild left hydronephrosis secondary to 3 calculi in the distal ureter largest measuring 6 mm.  Additionally noted to have a 7 mm nonobstructing left renal calculus and small left upper pole renal calculi.  Small cortical and parapelvic right renal cysts were also noted Continues to have intermittent pain.  He is on tamsulosin.  Pain is episodic and can be sudden at times Creatinine in late May x2 was 1.83 (baseline <1.0) however contralateral kidney was normal He desires to schedule stone removal I saw him in February 2022 for a 2 mm left distal ureteral calculus which he subsequently passed Additional episode due to a left distal ureteral calculus in 2020   PMH: Past Medical History:  Diagnosis Date   Crushing injury of left thumb 09/15/2017   Drug abuse in remission (HCC) 1990s   Cocaine with rehab   Dyslipidemia    mild   Fracture of tibia with fibula, closed, right, sequela 10/16/2017   Peripheral vascular disease (HCC) 2004   varicose veins right leg   Tendonitis 10/2010   temporal tendonitis with locked jaw following tooth extraction   Vitamin B12 deficiency 09/2013    Surgical History: Past Surgical History:  Procedure Laterality Date   BACK SURGERY  2005   "DISC  REPLACEMENT"   L5-6    COLONOSCOPY  12/2011   TAx1, diverticulosis, rpt 5 yrs - no further colonoscopy done yet (Dr Connolley @ [facility])   KNEE ARTHROPLASTY Left 2012   TONSILLECTOMY     TOTAL KNEE ARTHROPLASTY  11/12/2011   Procedure: TOTAL KNEE  ARTHROPLASTY;  Surgeon: Matthew D Olin, MD; Laterality: Right   TOTAL KNEE ARTHROPLASTY Bilateral    VARICOSE VEIN SURGERY Right    sclerotherapy    Home Medications:  Allergies as of 03/20/2022       Reactions   Oxycodone Rash        Medication List        Accurate as of March 20, 2022  2:01 PM. If you have any questions, ask your nurse or doctor.          Armodafinil 150 MG tablet Take 1 tablet (150 mg total) by mouth daily.   tamsulosin 0.4 MG Caps capsule Commonly known as: FLOMAX Take 1 capsule (0.4 mg total) by mouth daily.        Allergies:  Allergies  Allergen Reactions   Oxycodone Rash    Family History: Family History  Problem Relation Age of Onset   Hyperlipidemia Mother    Hypertension Mother    Diabetes Mother    ALS Sister    Cancer Neg Hx    CAD Neg Hx    Stroke Neg Hx     Social History:  reports that he has never smoked. He has never used smokeless tobacco. He reports current alcohol use. He reports that he does not use drugs.   Physical Exam: BP (!) 141/81   Pulse 93   Ht 6' 4" (1.93 m)   Wt (!) 340 lb (154.2 kg)     BMI 41.39 kg/m   Constitutional:  Alert and oriented, No acute distress. HEENT: [place] AT  Respiratory: Normal respiratory effort, no increased work of breathing. Neurologic: Grossly intact, no focal deficits, moving all 4 extremities. Psychiatric: Normal mood and affect.  Laboratory Data: Dipstick/microscopy negative   Pertinent Imaging: CT images 02/2022 were personally reviewed and interpreted    Assessment & Plan:    1.  Left distal ureteral calculi Failed MET and he desires to schedule stone treatment Based on stone location we discussed the procedure with the high success rate would be ureteroscopic removal.  An attempt could also be made to clear treat his left renal calculi.  Although more invasive and a slightly higher complication rate the success rate would be higher.  Potential complications were  discussed including bleeding, infection and ureteral injury.  The need for a postoperative ureteral stent was discussed and the possibility of stent pain/symptoms SWL could be performed but would have a lower success rate He would like to schedule ureteroscopy ASAP  2.  Renal colic  3. Hydronephrosis with urinary obstruction due to ureteral calculus   Tomisha Reppucci C Dimonique Bourdeau, MD  [facility] [address], [address] [place], [place] [address] [phone]  

## 2022-03-20 NOTE — H&P (View-Only) (Signed)
03/20/2022 2:01 PM   Steve Lynch 1956-06-15 [id number]  Referring provider: Ria Bush, MD [place],  [place] [address]  Chief Complaint  Patient presents with   Nephrolithiasis    HPI: Steve Lynch is a 66 y.o. male who presents in follow-up of recent ED visit for renal colic.  [facility] ED visit 03/04/2022 with complaints of left lower quadrant abdominal pain and urge to defecate CT abdomen/pelvis with contrast showed mild left hydronephrosis secondary to 3 calculi in the distal ureter largest measuring 6 mm.  Additionally noted to have a 7 mm nonobstructing left renal calculus and small left upper pole renal calculi.  Small cortical and parapelvic right renal cysts were also noted Continues to have intermittent pain.  He is on tamsulosin.  Pain is episodic and can be sudden at times Creatinine in late May x2 was 1.83 (baseline <1.0) however contralateral kidney was normal He desires to schedule stone removal I saw him in February 2022 for a 2 mm left distal ureteral calculus which he subsequently passed Additional episode due to a left distal ureteral calculus in 2020   PMH: Past Medical History:  Diagnosis Date   Crushing injury of left thumb 09/15/2017   Drug abuse in remission ([place]) 1990s   Cocaine with rehab   Dyslipidemia    mild   Fracture of tibia with fibula, closed, right, sequela 10/16/2017   Peripheral vascular disease ([place]) 2004   varicose veins right leg   Tendonitis 10/2010   temporal tendonitis with locked jaw following tooth extraction   Vitamin B12 deficiency 09/2013    Surgical History: Past Surgical History:  Procedure Laterality Date   BACK SURGERY  2005   "[place]"   L5-6    COLONOSCOPY  12/2011   TAx1, diverticulosis, rpt 5 yrs - no further colonoscopy done yet (Dr Bryn Gulling @ [facility])   KNEE ARTHROPLASTY Left 2012   TONSILLECTOMY     TOTAL KNEE ARTHROPLASTY  11/12/2011   Procedure: TOTAL KNEE  ARTHROPLASTY;  Surgeon: Mauri Pole, MD; Laterality: Right   TOTAL KNEE ARTHROPLASTY Bilateral    VARICOSE VEIN SURGERY Right    sclerotherapy    Home Medications:  Allergies as of 03/20/2022       Reactions   Oxycodone Rash        Medication List        Accurate as of March 20, 2022  2:01 PM. If you have any questions, ask your nurse or doctor.          Armodafinil 150 MG tablet Take 1 tablet (150 mg total) by mouth daily.   tamsulosin 0.4 MG Caps capsule Commonly known as: FLOMAX Take 1 capsule (0.4 mg total) by mouth daily.        Allergies:  Allergies  Allergen Reactions   Oxycodone Rash    Family History: Family History  Problem Relation Age of Onset   Hyperlipidemia Mother    Hypertension Mother    Diabetes Mother    ALS Sister    Cancer Neg Hx    CAD Neg Hx    Stroke Neg Hx     Social History:  reports that he has never smoked. He has never used smokeless tobacco. He reports current alcohol use. He reports that he does not use drugs.   Physical Exam: BP (!) 141/81   Pulse 93   Ht _0  (1.93 m)   Wt (!) 340 lb (154.2 kg)  BMI 41.39 kg/m   Constitutional:  Alert and oriented, No acute distress. HEENT: [place] AT  Respiratory: Normal respiratory effort, no increased work of breathing. Neurologic: Grossly intact, no focal deficits, moving all 4 extremities. Psychiatric: Normal mood and affect.  Laboratory Data: Dipstick/microscopy negative   Pertinent Imaging: CT images 02/2022 were personally reviewed and interpreted    Assessment & Plan:    1.  Left distal ureteral calculi Failed MET and he desires to schedule stone treatment Based on stone location we discussed the procedure with the high success rate would be ureteroscopic removal.  An attempt could also be made to clear treat his left renal calculi.  Although more invasive and a slightly higher complication rate the success rate would be higher.  Potential complications were  discussed including bleeding, infection and ureteral injury.  The need for a postoperative ureteral stent was discussed and the possibility of stent pain/symptoms SWL could be performed but would have a lower success rate He would like to schedule ureteroscopy ASAP  2.  Renal colic  3. Hydronephrosis with urinary obstruction due to ureteral calculus   Abbie Sons, MD  [facility] [address], [place], [place] [address] [phone]

## 2022-03-21 ENCOUNTER — Other Ambulatory Visit: Payer: Self-pay

## 2022-03-21 ENCOUNTER — Encounter
Admission: RE | Disposition: A | Discharge status: Home / Self Care | Payer: Self-pay | Source: Ambulatory Visit | Attending: Urology

## 2022-03-21 ENCOUNTER — Ambulatory Visit
Admission: RE | Admit: 2022-03-21 | Discharge: 2022-03-21 | Disposition: A | Discharge status: Home / Self Care | Payer: No Typology Code available for payment source | Source: Ambulatory Visit | Attending: Urology | Admitting: Urology

## 2022-03-21 ENCOUNTER — Ambulatory Visit: Payer: No Typology Code available for payment source | Admitting: Registered Nurse

## 2022-03-21 ENCOUNTER — Other Ambulatory Visit: Payer: Self-pay | Admitting: Urology

## 2022-03-21 ENCOUNTER — Ambulatory Visit: Payer: No Typology Code available for payment source

## 2022-03-21 ENCOUNTER — Encounter: Payer: Self-pay | Admitting: Urology

## 2022-03-21 DIAGNOSIS — I739 Peripheral vascular disease, unspecified: Secondary | ICD-10-CM | POA: Insufficient documentation

## 2022-03-21 DIAGNOSIS — N202 Calculus of kidney with calculus of ureter: Secondary | ICD-10-CM | POA: Insufficient documentation

## 2022-03-21 DIAGNOSIS — N201 Calculus of ureter: Secondary | ICD-10-CM

## 2022-03-21 DIAGNOSIS — Z6841 Body Mass Index (BMI) 40.0 and over, adult: Secondary | ICD-10-CM | POA: Diagnosis not present

## 2022-03-21 DIAGNOSIS — G4730 Sleep apnea, unspecified: Secondary | ICD-10-CM | POA: Diagnosis not present

## 2022-03-21 HISTORY — PX: CYSTOSCOPY W/ RETROGRADES: SHX1426

## 2022-03-21 HISTORY — PX: CYSTOSCOPY/URETEROSCOPY/HOLMIUM LASER/STENT PLACEMENT: SHX6546

## 2022-03-21 SURGERY — CYSTOSCOPY/URETEROSCOPY/HOLMIUM LASER/STENT PLACEMENT
Anesthesia: General | Laterality: Left

## 2022-03-21 MED ORDER — ONDANSETRON HCL 4 MG/2ML IJ SOLN
INTRAMUSCULAR | Status: DC | PRN
  Administered 2022-03-21: 4 mg via INTRAVENOUS

## 2022-03-21 MED ORDER — CEFAZOLIN IN SODIUM CHLORIDE 3-0.9 GM/100ML-% IV SOLN
3.0000 g | INTRAVENOUS | Status: DC
  Filled 2022-03-21: qty 100

## 2022-03-21 MED ORDER — ACETAMINOPHEN 10 MG/ML IV SOLN
INTRAVENOUS | Status: DC | PRN
  Administered 2022-03-21: 1000 mg via INTRAVENOUS

## 2022-03-21 MED ORDER — ROCURONIUM BROMIDE 10 MG/ML (PF) SYRINGE
PREFILLED_SYRINGE | INTRAVENOUS | Status: AC
  Filled 2022-03-21: qty 10

## 2022-03-21 MED ORDER — ONDANSETRON HCL 4 MG/2ML IJ SOLN
INTRAMUSCULAR | Status: AC
  Filled 2022-03-21: qty 2

## 2022-03-21 MED ORDER — FENTANYL CITRATE (PF) 100 MCG/2ML IJ SOLN
INTRAMUSCULAR | Status: AC
  Filled 2022-03-21: qty 2

## 2022-03-21 MED ORDER — LIDOCAINE HCL (CARDIAC) PF 100 MG/5ML IV SOSY
PREFILLED_SYRINGE | INTRAVENOUS | Status: DC | PRN
  Administered 2022-03-21: 100 mg via INTRAVENOUS

## 2022-03-21 MED ORDER — DEXAMETHASONE SODIUM PHOSPHATE 10 MG/ML IJ SOLN
INTRAMUSCULAR | Status: AC
  Filled 2022-03-21: qty 1

## 2022-03-21 MED ORDER — OXYBUTYNIN CHLORIDE 5 MG PO TABS: ORAL_TABLET | ORAL | 0 refills | Status: DC

## 2022-03-21 MED ORDER — DEXAMETHASONE SODIUM PHOSPHATE 10 MG/ML IJ SOLN
INTRAMUSCULAR | Status: DC | PRN
  Administered 2022-03-21: 10 mg via INTRAVENOUS

## 2022-03-21 MED ORDER — SUGAMMADEX SODIUM 500 MG/5ML IV SOLN
INTRAVENOUS | Status: AC
  Filled 2022-03-21: qty 5

## 2022-03-21 MED ORDER — ALBUTEROL SULFATE HFA 108 (90 BASE) MCG/ACT IN AERS
INHALATION_SPRAY | RESPIRATORY_TRACT | Status: AC
  Filled 2022-03-21: qty 6.7

## 2022-03-21 MED ORDER — SUGAMMADEX SODIUM 500 MG/5ML IV SOLN
INTRAVENOUS | Status: DC | PRN
  Administered 2022-03-21: 200 mg via INTRAVENOUS
  Administered 2022-03-21: 150 mg via INTRAVENOUS
  Administered 2022-03-21: 50 mg via INTRAVENOUS

## 2022-03-21 MED ORDER — LACTATED RINGERS IV SOLN: INTRAVENOUS | Status: DC

## 2022-03-21 MED ORDER — ONDANSETRON HCL 4 MG/2ML IJ SOLN: 4.0000 mg | Freq: Once | INTRAMUSCULAR | Status: DC | PRN

## 2022-03-21 MED ORDER — ALBUTEROL SULFATE HFA 108 (90 BASE) MCG/ACT IN AERS
INHALATION_SPRAY | RESPIRATORY_TRACT | Status: DC | PRN
  Administered 2022-03-21: 6 via RESPIRATORY_TRACT

## 2022-03-21 MED ORDER — LIDOCAINE HCL (PF) 2 % IJ SOLN
INTRAMUSCULAR | Status: AC
  Filled 2022-03-21: qty 5

## 2022-03-21 MED ORDER — DEXMEDETOMIDINE HCL IN NACL 200 MCG/50ML IV SOLN
INTRAVENOUS | Status: DC | PRN
  Administered 2022-03-21 (×3): 4 ug via INTRAVENOUS

## 2022-03-21 MED ORDER — DEXTROSE 5 % IV SOLN
INTRAVENOUS | Status: DC | PRN
  Administered 2022-03-21: 3 g via INTRAVENOUS

## 2022-03-21 MED ORDER — CHLORHEXIDINE GLUCONATE 0.12 % MT SOLN: 15.0000 mL | Freq: Once | OROMUCOSAL | Status: AC

## 2022-03-21 MED ORDER — PROPOFOL 10 MG/ML IV BOLUS
INTRAVENOUS | Status: AC
  Filled 2022-03-21: qty 20

## 2022-03-21 MED ORDER — SEVOFLURANE IN SOLN
RESPIRATORY_TRACT | Status: AC
  Filled 2022-03-21: qty 250

## 2022-03-21 MED ORDER — ORAL CARE MOUTH RINSE: 15.0000 mL | Freq: Once | OROMUCOSAL | Status: AC

## 2022-03-21 MED ORDER — FENTANYL CITRATE (PF) 100 MCG/2ML IJ SOLN: 25.0000 ug | INTRAMUSCULAR | Status: DC | PRN

## 2022-03-21 MED ORDER — GLYCOPYRROLATE 0.2 MG/ML IJ SOLN
INTRAMUSCULAR | Status: AC
Start: 2022-03-21 — End: ?
  Filled 2022-03-21: qty 1

## 2022-03-21 MED ORDER — PROPOFOL 10 MG/ML IV BOLUS
INTRAVENOUS | Status: DC | PRN
  Administered 2022-03-21 (×2): 50 mg via INTRAVENOUS
  Administered 2022-03-21: 200 mg via INTRAVENOUS
  Administered 2022-03-21: 50 mg via INTRAVENOUS

## 2022-03-21 MED ORDER — FENTANYL CITRATE (PF) 100 MCG/2ML IJ SOLN
INTRAMUSCULAR | Status: DC | PRN
  Administered 2022-03-21: 25 ug via INTRAVENOUS
  Administered 2022-03-21: 50 ug via INTRAVENOUS
  Administered 2022-03-21: 25 ug via INTRAVENOUS

## 2022-03-21 MED ORDER — LACTATED RINGERS IV SOLN: INTRAVENOUS | Status: DC | PRN

## 2022-03-21 MED ORDER — ACETAMINOPHEN 10 MG/ML IV SOLN: 1000.0000 mg | Freq: Once | INTRAVENOUS | Status: DC | PRN

## 2022-03-21 MED ORDER — GLYCOPYRROLATE 0.2 MG/ML IJ SOLN
INTRAMUSCULAR | Status: DC | PRN
  Administered 2022-03-21: .2 mg via INTRAVENOUS

## 2022-03-21 MED ORDER — ACETAMINOPHEN 10 MG/ML IV SOLN
INTRAVENOUS | Status: AC
  Filled 2022-03-21: qty 100

## 2022-03-21 MED ORDER — ROCURONIUM BROMIDE 100 MG/10ML IV SOLN
INTRAVENOUS | Status: DC | PRN
  Administered 2022-03-21: 20 mg via INTRAVENOUS
  Administered 2022-03-21: 50 mg via INTRAVENOUS

## 2022-03-21 MED ORDER — CHLORHEXIDINE GLUCONATE 0.12 % MT SOLN
OROMUCOSAL | Status: AC
  Administered 2022-03-21: 15 mL via OROMUCOSAL
  Filled 2022-03-21: qty 15

## 2022-03-21 MED ORDER — HYDROCODONE-ACETAMINOPHEN 5-325 MG PO TABS: 1.0000 | ORAL_TABLET | ORAL | 0 refills | Status: DC | PRN

## 2022-03-21 SURGICAL SUPPLY — 33 items
BAG DRAIN CYSTO-URO LG1000N (MISCELLANEOUS) ×2 IMPLANT
BASKET LASER NITINOL 1.9FR (BASKET) ×1 IMPLANT
BASKET ZERO TIP 1.9FR (BASKET) ×1 IMPLANT
BRUSH SCRUB EZ 1% IODOPHOR (MISCELLANEOUS) ×2 IMPLANT
BSKT STON RTRVL 120 1.9FR (BASKET) ×1
BSKT STON RTRVL ZERO TP 1.9FR (BASKET) ×1
CATH ROBINSON RED A/P 16FR (CATHETERS) ×1 IMPLANT
CATH URET FLEX-TIP 2 LUMEN 10F (CATHETERS) IMPLANT
CATH URETL OPEN END 6X70 (CATHETERS) ×1 IMPLANT
CNTNR SPEC 2.5X3XGRAD LEK (MISCELLANEOUS)
CONT SPEC 4OZ STER OR WHT (MISCELLANEOUS)
CONT SPEC 4OZ STRL OR WHT (MISCELLANEOUS)
CONTAINER SPEC 2.5X3XGRAD LEK (MISCELLANEOUS) IMPLANT
DRAPE UTILITY 15X26 TOWEL STRL (DRAPES) ×2 IMPLANT
DRSG TEGADERM 4X4.75 (GAUZE/BANDAGES/DRESSINGS) ×1 IMPLANT
GLOVE SURG UNDER POLY LF SZ7.5 (GLOVE) ×2 IMPLANT
GOWN STRL REUS W/ TWL LRG LVL3 (GOWN DISPOSABLE) ×1 IMPLANT
GOWN STRL REUS W/ TWL XL LVL3 (GOWN DISPOSABLE) ×1 IMPLANT
GOWN STRL REUS W/TWL LRG LVL3 (GOWN DISPOSABLE) ×2
GOWN STRL REUS W/TWL XL LVL3 (GOWN DISPOSABLE) ×2
GUIDEWIRE STR DUAL SENSOR (WIRE) ×2 IMPLANT
GUIDEWIRE STR ZIPWIRE 035X150 (MISCELLANEOUS) ×1 IMPLANT
IV NS IRRIG 3000ML ARTHROMATIC (IV SOLUTION) ×2 IMPLANT
KIT TURNOVER CYSTO (KITS) ×2 IMPLANT
PACK CYSTO AR (MISCELLANEOUS) ×2 IMPLANT
SET CYSTO W/LG BORE CLAMP LF (SET/KITS/TRAYS/PACK) ×2 IMPLANT
SHEATH NAVIGATOR HD 12/14X36 (SHEATH) IMPLANT
STENT URET 6FRX24 CONTOUR (STENTS) IMPLANT
STENT URET 6FRX26 CONTOUR (STENTS) ×1 IMPLANT
SURGILUBE 2OZ TUBE FLIPTOP (MISCELLANEOUS) ×2 IMPLANT
TRACTIP FLEXIVA PULSE ID 200 (Laser) ×2 IMPLANT
VALVE UROSEAL ADJ ENDO (VALVE) ×1 IMPLANT
WATER STERILE IRR 500ML POUR (IV SOLUTION) ×2 IMPLANT

## 2022-03-21 NOTE — Discharge Instructions (Addendum)
AMBULATORY SURGERY  DISCHARGE INSTRUCTIONS   The drugs that you were given will stay in your system until tomorrow so for the next 24 hours you should not:  Drive an automobile Make any legal decisions Drink any alcoholic beverage   You may resume regular meals tomorrow.  Today it is better to start with liquids and gradually work up to solid foods.  You may eat anything you prefer, but it is better to start with liquids, then soup and crackers, and gradually work up to solid foods.   Please notify your doctor immediately if you have any unusual bleeding, trouble breathing, redness and pain at the surgery site, drainage, fever, or pain not relieved by medication.    Additional Instructions:   DISCHARGE INSTRUCTIONS FOR KIDNEY STONE/URETERAL STENT   MEDICATIONS:  1. Resume all your other meds from home.  2.  AZO (over-the-counter) can help with the burning/stinging when you urinate. 3.  Hydrocodone is for moderate/severe pain, Rx was sent to your pharmacy. 4.  Continue tamsulosin as it may help with stent discomfort.  Rx oxybutynin was also sent to pharmacy which will help with stent/bladder discomfort  ACTIVITY:  1. May resume regular activities in 24 hours. 2. No driving while on narcotic pain medications  3. Drink plenty of water  4. Continue to walk at home - you can still get blood clots when you are at home, so keep active, but don't over do it.  5. May return to work/school tomorrow or when you feel ready   BATHING:  1. You can shower. 2. You have a string coming from your urethra: The stent string is attached to your ureteral stent. Do not pull on this.   SIGNS/SYMPTOMS TO [place]:  Common postoperative symptoms include urinary frequency, urgency, bladder spasm and blood in the urine  Please [place] if you have a fever greater than 101.5, uncontrolled nausea/vomiting, uncontrolled pain, dizziness, unable to urinate, excessively bloody urine, chest pain, shortness of  breath, leg swelling, leg pain, or any other concerns or questions.   You can reach [place] at [phone].   FOLLOW-UP:  1.  A follow-up appointment will be scheduled in 1 month and the office will contact you with the date and time 2. You have a string attached to your stent, you may remove it on Saturday, 03/23/2022. To do this, pull the string until the stent is completely removed. You may feel an odd sensation in your back.      Please contact your physician with any problems or Same Day Surgery at [phone], Monday through Friday 6 am to 4 pm, or [place] at [facility] number at [phone].

## 2022-03-21 NOTE — Transfer of Care (Signed)
Immediate Anesthesia Transfer of Care Note  Patient: Burnice Logan Weichert  Procedure(s) Performed: CYSTOSCOPY/URETEROSCOPY/HOLMIUM LASER/STENT PLACEMENT (Left) CYSTOSCOPY WITH RETROGRADE PYELOGRAM (Left)  Patient Location: PACU  Anesthesia Type:General  Level of Consciousness: awake, alert  and oriented  Airway & Oxygen Therapy: Patient Spontanous Breathing and Patient connected to face mask oxygen  Post-op Assessment: Report given to RN  Post vital signs: Reviewed and stable  Last Vitals:  Vitals Value Taken Time  BP 133/77 03/21/22 1800  Temp 36.4 C 03/21/22 1800  Pulse 71 03/21/22 1804  Resp 19 03/21/22 1804  SpO2 100 % 03/21/22 1804  Vitals shown include unvalidated device data.  Last Pain:  Vitals:   03/21/22 1425  TempSrc: Oral  PainSc: 5          Complications: No notable events documented.

## 2022-03-21 NOTE — Anesthesia Procedure Notes (Signed)
Procedure Name: Intubation Date/Time: 03/21/2022 4:48 PM  Performed by: Gayland Curry, CRNAPre-anesthesia Checklist: Patient identified, Emergency Drugs available, Suction available and Patient being monitored Patient Re-evaluated:Patient Re-evaluated prior to induction Oxygen Delivery Method: Circle system utilized Preoxygenation: Pre-oxygenation with 100% oxygen Induction Type: IV induction Ventilation: Mask ventilation without difficulty, Two handed mask ventilation required and Oral airway inserted - appropriate to patient size Laryngoscope Size: McGraph and 4 Grade View: Grade I Tube type: Oral Tube size: 7.0 mm Number of attempts: 1 Airway Equipment and Method: Stylet Placement Confirmation: ETT inserted through vocal cords under direct vision, positive ETCO2 and breath sounds checked- equal and bilateral Secured at: 23 cm Tube secured with: Tape Dental Injury: Teeth and Oropharynx as per pre-operative assessment

## 2022-03-21 NOTE — Anesthesia Preprocedure Evaluation (Addendum)
Anesthesia Evaluation  Patient identified by MRN, date of birth, ID band Patient awake    Reviewed: Allergy & Precautions, NPO status , Patient's Chart, lab work & pertinent test results  History of Anesthesia Complications Negative for: history of anesthetic complications  Airway Mallampati: III   Neck ROM: Full    Dental  (+) Missing   Pulmonary sleep apnea and Continuous Positive Airway Pressure Ventilation ,    Pulmonary exam normal breath sounds clear to auscultation       Cardiovascular + Peripheral Vascular Disease  Normal cardiovascular exam Rhythm:Regular Rate:Normal     Neuro/Psych Hx cocaine use disorder, in remission    GI/Hepatic negative GI ROS,   Endo/Other  Class 3 obesity  Renal/GU Renal disease (nephrolithiasis)     Musculoskeletal   Abdominal   Peds  Hematology negative hematology ROS (+)   Anesthesia Other Findings   Reproductive/Obstetrics                            Anesthesia Physical Anesthesia Plan  ASA: 3  Anesthesia Plan: General   Post-op Pain Management:    Induction: Intravenous  PONV Risk Score and Plan: 2 and Ondansetron, Dexamethasone and Treatment may vary due to age or medical condition  Airway Management Planned: Oral ETT  Additional Equipment:   Intra-op Plan:   Post-operative Plan: Extubation in OR  Informed Consent: I have reviewed the patients History and Physical, chart, labs and discussed the procedure including the risks, benefits and alternatives for the proposed anesthesia with the patient or authorized representative who has indicated his/her understanding and acceptance.     Dental advisory given  Plan Discussed with: CRNA  Anesthesia Plan Comments: (Patient consented for risks of anesthesia including but not limited to:  - adverse reactions to medications - damage to eyes, teeth, lips or other oral mucosa - nerve damage  due to positioning  - sore throat or hoarseness - damage to heart, brain, nerves, lungs, other parts of body or loss of life  Informed patient about role of CRNA in peri- and intra-operative care.  Patient voiced understanding.)        Anesthesia Quick Evaluation

## 2022-03-21 NOTE — Op Note (Signed)
Preoperative diagnosis:  1.  Left distal ureteral calculus 2.  Left nephrolithiasis  Postoperative diagnosis:  1.  Left distal ureteral calculus 2.  Left nephrolithiasis  Procedure:  Cystoscopy Left ureteroscopy and stone removal Ureteroscopic laser lithotripsy Left ureteral stent placement (12F/26 cm)  Left retrograde pyelography with interpretation  Surgeon: Lorin Picket C. Ahava Kissoon, M.D.  Anesthesia: General  Complications: None  Intraoperative findings:  Cystoscopy-urethra with wide caliber bulbar stricture.  Prostate with moderate lateral lobe enlargement and normal bladder neck.  Bladder mucosa without erythema, solid or papillary lesions.  UOs normal-appearing bilaterally.  No solid or papillary lesions.  Mild trabeculation Left ureteropyeloscopy-6 mm left distal ureteral calculus which was free-floating and not impacted.  The 3 mm calculi x2 seen on CT were not present.  Pyeloscopy with multiple Randall's plaques.  Left lower pole renal calculus in a posterior compound calyx and unable to be treated. Left retrograde pyelography post procedure showed no filling defects, stone fragments or contrast extravasation  EBL: Minimal  Specimens: Calculus fragments for analysis   Indication: Steve Lynch is a 66 y.o. male with history of recurrent stone disease and left lower quadrant abdominal pain evaluation in the ED 03/04/2022.  CT with nonobstructing bilateral renal calculi and left distal ureteral calculi x3 (3 mm, 6 mm, 3 mm).  Continues with intermittent pain and has elected ureteroscopic removal.  After reviewing the management options for treatment, the patient elected to proceed with the above surgical procedure(s). We have discussed the potential benefits and risks of the procedure, side effects of the proposed treatment, the likelihood of the patient achieving the goals of the procedure, and any potential problems that might occur during the procedure or recuperation. Informed  consent has been obtained.  Description of procedure:  The patient was taken to the operating room and general anesthesia was induced.  The patient was placed in the dorsal lithotomy position, prepped and draped in the usual sterile fashion, and preoperative antibiotics were administered. A preoperative time-out was performed.   A 22 French cystoscope was lubricated, passed per urethra and advanced proximally under direct vision with findings as described above.    Attention was directed to the left ureteral orifice and a 0.038 Sensor wire was unable to be advanced up the due to resistance from the distal ureteral calculus.  After removal of the Sensor wire a 0.038 Zip-wire was placed through a 6 [place] open-ended ureteral catheter which was positioned at the left UO.  The wire was advanced beyond the calculus and into the left renal pelvis without difficulty.  A 4.5 Fr semirigid ureteroscope was then advanced into the ureter next to the guidewire and the calculus was identified as described above.  The stone was then dusted with a 242 m micron laser fiber on a setting of 0.3 J / 40 Hz.  Once dusted to a fragment of ~ 3 mm mid was placed in a 1.9 [place] nitinol basket and sent for analysis.  The ureteroscope was advanced to the proximal ureter and no additional calculi or stone fragments were seen.  The semirigid ureteroscope was removed and a 8 [place] single channel digital flexible ureteroscope was passed per urethra.  The left ureteral orifice was easily engaged with ureteroscope alongside the guidewire and advanced to the renal pelvis without difficulty.  No calculi were noted in the proximal ureter.  Retrograde pyelogram was performed through the ureteroscope and all calyces were sequentially examined with findings as described above.  The lower pole calculus was in  the dependent portion of a compound calyx and only the edge of the calculus could be seen with the ureteroscope at maximal  deflection.  Several unsuccessful attempts were made to place the calculus in an Escape basket and the calculus was unable to be treated..  A 15F/26 cm Contour ureteral stent with tether was placed under fluoroscopic guidance.  The wire was then removed with an adequate stent curl noted in the renal pelvis as well as in the bladder.  The bladder was then emptied and the procedure ended.  The patient appeared to tolerate the procedure well and without complications.  After anesthetic reversal the patient was transported to the PACU in stable condition.   Plan: He was instructed to remove the stent via tether on Saturday, 03/23/2022 Postop follow-up will be scheduled in approximately 1 month   Irineo Axon, MD

## 2022-03-21 NOTE — Progress Notes (Signed)
Surgical Physician Order Form [facility]  * Scheduling expectation : ASAP  *Length of Case: 45 minutes  *Clearance needed: no  *Anticoagulation Instructions: N/A  *Aspirin Instructions: N/A  *Post-op visit Date/Instructions:   TBD  *Diagnosis: Left Ureteral Stone  *Procedure: Left Ureteroscopy w/laser lithotripsy & stent placement ([id number])   Additional orders: N/A  -Admit type: OUTpatient  -Anesthesia: Choice  -VTE Prophylaxis Standing Order SCD's       Other:   -Standing Lab Orders Per Anesthesia    Lab other: None  -Standing Test orders EKG/Chest x-ray per Anesthesia       Test other:   - Medications:   Ancef 3 g  -Other orders:  N/A

## 2022-03-21 NOTE — Progress Notes (Signed)
[facility] Posting Form   Surgery Date/Time: Date: 03/21/2022  Surgeon: Dr. Irineo Axon, MD  Surgery Location: Day Surgery  Inpt ( No  )   Outpt (Yes)   Obs ( No  )   Diagnosis: N20.1 Left Ureteral Stone  -CPT: [phone]  Surgery: Left Ureteroscopy with laser lithotripsy and stent placement   Stop Anticoagulations: N/A  Cardiac/Medical/Pulmonary Clearance needed: no  *Orders entered into EPIC  Date: 03/21/22   *Case booked in EPIC  Date: 03/21/22  *Notified pt of Surgery: Date: 03/21/22  PRE-OP UA & CX: no  *Placed into Prior Authorization Work Que Date: 03/21/22   Assistant/laser/rep:No

## 2022-03-21 NOTE — Interval H&P Note (Signed)
History and Physical Interval Note:  03/21/2022 4:34 PM  Steve Lynch  has presented today for surgery, with the diagnosis of Left Ureteral Stone.  The various methods of treatment have been discussed with the patient and family. After consideration of risks, benefits and other options for treatment, the patient has consented to  Procedure(s): CYSTOSCOPY/URETEROSCOPY/HOLMIUM LASER/STENT PLACEMENT (Left) as a surgical intervention.  The patient's history has been reviewed, patient examined, no change in status, stable for surgery.  I have reviewed the patient's chart and labs.  Questions were answered to the patient's satisfaction.     Benna Arno C Catrice Zuleta

## 2022-03-21 NOTE — Anesthesia Postprocedure Evaluation (Signed)
Anesthesia Post Note  Patient: Steve Lynch  Procedure(s) Performed: CYSTOSCOPY/URETEROSCOPY/HOLMIUM LASER/STENT PLACEMENT (Left) CYSTOSCOPY WITH RETROGRADE PYELOGRAM (Left)  Patient location during evaluation: PACU Anesthesia Type: General Level of consciousness: awake and alert, oriented and patient cooperative Pain management: pain level controlled Vital Signs Assessment: post-procedure vital signs reviewed and stable Respiratory status: spontaneous breathing, nonlabored ventilation and respiratory function stable Cardiovascular status: blood pressure returned to baseline and stable Postop Assessment: adequate PO intake Anesthetic complications: no   No notable events documented.   Last Vitals:  Vitals:   03/21/22 1832 03/21/22 1833  BP: 123/72   Pulse: 67 63  Resp: 13 11  Temp: 36.4 C   SpO2: 96% 96%    Last Pain:  Vitals:   03/21/22 1832  TempSrc: Temporal  PainSc: 0-No pain                 Reed Breech

## 2022-03-22 ENCOUNTER — Encounter: Payer: Self-pay | Admitting: Urology

## 2022-03-22 ENCOUNTER — Telehealth: Payer: Self-pay

## 2022-03-22 NOTE — Telephone Encounter (Signed)
Incoming [place] from pt on triage line who states he has concerns about his current symptoms post stent placement. The patient c/o hematuria, urinary frequency, and pain at the head of his penis. Patient does not wish to take the RX for Norco he was given due to history of drug addiction. Advised pt that these are all normal symptoms with ureteral sent in place. Advised pt on the use of NSAIDS for pain relief, pushing fluids to clear urine, and the importance of Oxybutynin for bladder symptom control. Patient voiced understanding. Pt questions if he may take AZO. Advised pt ok to take for short term. Pt is going out of town today, he will pull stents out tomorrow.

## 2022-03-25 ENCOUNTER — Encounter: Payer: No Typology Code available for payment source | Admitting: Family Medicine

## 2022-03-26 ENCOUNTER — Other Ambulatory Visit: Payer: Self-pay | Admitting: Family Medicine

## 2022-03-26 LAB — CALCULI, WITH PHOTOGRAPH (CLINICAL LAB)
Uric Acid Calculi: 100 %
Weight Calculi: 6 mg

## 2022-03-28 ENCOUNTER — Other Ambulatory Visit: Payer: Self-pay

## 2022-03-28 NOTE — Telephone Encounter (Signed)
Patient Advocate Encounter  Prior Authorization for Armodafinil 150 mg has been approved.    PA# [id number] SS Effective dates: 03/19/2022 through 03/19/2023  Patients co-pay is $10.

## 2022-03-28 NOTE — Telephone Encounter (Signed)
Spoke with pt asking if he is still taking Flomax.  Says he is not.  Denied refill.

## 2022-04-05 ENCOUNTER — Encounter: Payer: Self-pay | Admitting: Pulmonary Disease

## 2022-04-09 ENCOUNTER — Telehealth: Payer: Self-pay | Admitting: Pulmonary Disease

## 2022-04-22 ENCOUNTER — Ambulatory Visit: Payer: No Typology Code available for payment source | Admitting: Urology

## 2022-04-29 NOTE — Telephone Encounter (Signed)
No [place] back.     Closing encounter.

## 2022-04-30 ENCOUNTER — Ambulatory Visit
Admission: RE | Admit: 2022-04-30 | Discharge: 2022-04-30 | Disposition: A | Discharge status: Home / Self Care | Payer: No Typology Code available for payment source | Source: Ambulatory Visit | Attending: Family Medicine | Admitting: Family Medicine

## 2022-04-30 VITALS — BP 105/71 | HR 104 | Temp 97.8°F | Resp 18

## 2022-04-30 DIAGNOSIS — J209 Acute bronchitis, unspecified: Secondary | ICD-10-CM

## 2022-04-30 MED ORDER — PREDNISONE 20 MG PO TABS: 20.0000 mg | ORAL_TABLET | Freq: Every day | ORAL | 0 refills | Status: AC

## 2022-04-30 MED ORDER — DOXYCYCLINE HYCLATE 100 MG PO CAPS: 100.0000 mg | ORAL_CAPSULE | Freq: Two times a day (BID) | ORAL | 0 refills | Status: DC

## 2022-04-30 NOTE — ED Triage Notes (Signed)
Patient presents to Urgent Care with complaints of nagging cough x 2 weeks. Not taking any meds.

## 2022-04-30 NOTE — ED Provider Notes (Signed)
Steve Lynch    CSN: [id number] Arrival date & time: 04/30/22  1321      History   Chief Complaint Chief Complaint  Patient presents with   Cough    Entered by patient    HPI Steve Lynch is a 66 y.o. male.   HPI Patient with a history of OSA with CPAP,  presents for evaluation of worsening cough with congestion. He endorses his wife is currently sick, and experiencing similar symptoms. He denies congestion or runny nose. Denies fever. He has a history of bronchitis. Denies any current SOB or wheezing although oxygen level is 94%. Past Medical History:  Diagnosis Date   Crushing injury of left thumb 09/15/2017   Drug abuse in remission (HCC) 1990s   Cocaine with rehab   Dyslipidemia    mild   Fracture of tibia with fibula, closed, right, sequela 10/16/2017   Peripheral vascular disease (HCC) 2004   varicose veins right leg   Tendonitis 10/2010   temporal tendonitis with locked jaw following tooth extraction   Vitamin B12 deficiency 09/2013    Patient Active Problem List   Diagnosis Date Noted   Advanced directives, counseling/discussion 02/19/2022   Low testosterone in male 01/23/2021   Weakness of both lower extremities 01/23/2021   Chronic heel pain, left 07/10/2020   Subconjunctival hemorrhage of left eye 01/22/2020   Paresthesias 01/22/2020   Intertrigo 01/22/2020   Fracture of tibia with fibula, closed, right, sequela 10/16/2017   Essential tremor 10/14/2017   MDD (major depressive disorder), single episode, in full remission (HCC) 04/02/2017   OSA on CPAP 08/13/2016   Obesity, morbid, BMI 40.0-49.9 (HCC) 08/13/2016   BPH associated with nocturia 08/13/2016   Leg edema, right 08/13/2016   Dyslipidemia    Health maintenance examination 10/01/2013   Vitamin B12 deficiency 09/13/2013   Chronic cough 08/16/2013   Fatigue 08/16/2013    Past Surgical History:  Procedure Laterality Date   BACK SURGERY  2005   "DISC  REPLACEMENT"   L5-6    COLONOSCOPY   12/2011   TAx1, diverticulosis, rpt 5 yrs - no further colonoscopy done yet (Dr Myra Gianotti @ [facility])   CYSTOSCOPY W/ RETROGRADES Left 03/21/2022   Procedure: CYSTOSCOPY WITH RETROGRADE PYELOGRAM;  Surgeon: Riki Altes, MD;  Location: [facility] OR[facility];  Service: Urology;  Laterality: Left;   CYSTOSCOPY/URETEROSCOPY/HOLMIUM LASER/STENT PLACEMENT Left 03/21/2022   Procedure: CYSTOSCOPY/URETEROSCOPY/HOLMIUM LASER/STENT PLACEMENT;  Surgeon: Riki Altes, MD;  Location: [facility];  Service: Urology;  Laterality: Left;   KNEE ARTHROPLASTY Left 2012   TONSILLECTOMY     TOTAL KNEE ARTHROPLASTY  11/12/2011   Procedure: TOTAL KNEE ARTHROPLASTY;  Surgeon: Shelda Pal, MD; Laterality: Right   TOTAL KNEE ARTHROPLASTY Bilateral    VARICOSE VEIN SURGERY Right    sclerotherapy       Home Medications    Prior to Admission medications   Medication Sig Start Date End Date Taking? Authorizing Provider  doxycycline (VIBRAMYCIN) 100 MG capsule Take 1 capsule (100 mg total) by mouth 2 (two) times daily. 04/30/22  Yes Bing Neighbors, FNP  predniSONE (DELTASONE) 20 MG tablet Take 1 tablet (20 mg total) by mouth daily with breakfast for 5 days. 04/30/22 05/05/22 Yes Bing Neighbors, FNP  Armodafinil 150 MG tablet Take 1 tablet (150 mg total) by mouth daily. 03/19/22   Tomma Lightning, MD  HYDROcodone-acetaminophen (NORCO/VICODIN) 5-325 MG tablet Take 1 tablet by mouth every 4 (four) hours as needed for moderate pain.  03/21/22   Abbie Sons, MD  oxybutynin (DITROPAN) 5 MG tablet 1 tab tid prn frequency,urgency, bladder spasm 03/21/22   Stoioff, Ronda Fairly, MD    Family History Family History  Problem Relation Age of Onset   Hyperlipidemia Mother    Hypertension Mother    Diabetes Mother    ALS Sister    Cancer Neg Hx    CAD Neg Hx    Stroke Neg Hx     Social History Social History   Tobacco Use   Smoking status: Never   Smokeless tobacco: Never  Vaping Use   Vaping Use: Never used   Substance Use Topics   Alcohol use: Yes    Comment: Very rare   Drug use: No    Comment: COCAINE ADDICTION WITH REHAB   21 YEARS AGO     Allergies   Oxycodone Review of Systems Review of Systems Pertinent negatives listed in HPI   Physical Exam Triage Vital Signs ED Triage Vitals [04/30/22 1332]  Enc Vitals Group     BP 105/71     Pulse Rate (!) 104     Resp 18     Temp 97.8 F (36.6 C)     Temp Source Temporal     SpO2 94 %     Weight      Height      Head Circumference      Peak Flow      Pain Score 0     Pain Loc      Pain Edu?      Excl. in [place]?    No data found.  Updated Vital Signs BP 105/71 (BP Location: Left Arm)   Pulse (!) 104   Temp 97.8 F (36.6 C) (Temporal)   Resp 18   SpO2 94%   Visual Acuity Right Eye Distance:   Left Eye Distance:   Bilateral Distance:    Right Eye Near:   Left Eye Near:    Bilateral Near:     Physical Exam Constitutional:      Appearance: Normal appearance.  HENT:     Head: Normocephalic and atraumatic.     Nose: Nose normal.  Eyes:     Extraocular Movements: Extraocular movements intact.     Pupils: Pupils are equal, round, and reactive to light.  Cardiovascular:     Rate and Rhythm: Normal rate and regular rhythm.  Pulmonary:     Effort: Pulmonary effort is normal.     Breath sounds: Decreased air movement present. Rhonchi present.  Musculoskeletal:        General: Normal range of motion.     Cervical back: Normal range of motion and neck supple.  Skin:    General: Skin is warm and dry.     Capillary Refill: Capillary refill takes less than 2 seconds.  Neurological:     General: No focal deficit present.     Mental Status: He is alert and oriented to person, place, and time.     GCS: GCS eye subscore is 4. GCS verbal subscore is 5. GCS motor subscore is 6.      UC Treatments / Results  Labs (all labs ordered are listed, but only abnormal results are displayed) Labs Reviewed - No data to  display  EKG   Radiology No results found.  Procedures Procedures (including critical care time)  Medications Ordered in UC Medications - No data to display  Initial Impression / Assessment and Plan / UC Course  I have reviewed the triage vital signs and the nursing notes.  Pertinent labs & imaging results that were available during my care of the patient were reviewed by me and considered in my medical decision making (see chart for details).    Acute Bronchitis Treatment per discharge medications orders. Hydrate well with water. Return if symptoms worsen or do not improve.  Final Clinical Impressions(s) / UC Diagnoses   Final diagnoses:  Acute bronchitis, unspecified organism     Discharge Instructions      Hydrate well with fluids.  Complete entire course of both the prednisone and the doxycycline.  If you develop severe shortness of breath or chest tightness of pain,  go to the nearest emergency department . If symptoms improve  but do not completely resolve within 10 days, follow-up here or with your primary care provider for further evaluation.      ED Prescriptions     Medication Sig Dispense Auth. Provider   doxycycline (VIBRAMYCIN) 100 MG capsule Take 1 capsule (100 mg total) by mouth 2 (two) times daily. 20 capsule Bing Neighbors, FNP   predniSONE (DELTASONE) 20 MG tablet Take 1 tablet (20 mg total) by mouth daily with breakfast for 5 days. 5 tablet Bing Neighbors, FNP      PDMP not reviewed this encounter.   Bing Neighbors, FNP 04/30/22 1441

## 2022-04-30 NOTE — Discharge Instructions (Signed)
Hydrate well with fluids.  Complete entire course of both the prednisone and the doxycycline.  If you develop severe shortness of breath or chest tightness of pain,  go to the nearest emergency department . If symptoms improve  but do not completely resolve within 10 days, follow-up here or with your primary care provider for further evaluation.

## 2022-05-03 ENCOUNTER — Encounter: Payer: Self-pay | Admitting: Urology

## 2022-05-03 ENCOUNTER — Ambulatory Visit (INDEPENDENT_AMBULATORY_CARE_PROVIDER_SITE_OTHER): Payer: No Typology Code available for payment source | Admitting: Urology

## 2022-05-03 VITALS — BP 165/86 | HR 99 | Ht 72.0 in | Wt 339.0 lb

## 2022-05-03 DIAGNOSIS — N200 Calculus of kidney: Secondary | ICD-10-CM | POA: Diagnosis not present

## 2022-05-03 NOTE — Progress Notes (Signed)
05/03/2022 1:29 PM   Steve Lynch 08/05/1956 [id number]  Referring provider: [name], MD [address] [place],  [place] [address]  Chief Complaint  Patient presents with   Other    HPI: 66 y.o. male presents for postop follow-up.  Ureteroscopic removal of a left distal ureteral calculus and nonobstructing left renal calculi 03/21/2022 No postoperative problems He removed his stent 1 day early as it partially dislodged the evening of 03/22/2022 No problems after stent removal Stone analysis 100% uric acid   PMH: Past Medical History:  Diagnosis Date   Crushing injury of left thumb 09/15/2017   Drug abuse in remission (HCC) 1990s   Cocaine with rehab   Dyslipidemia    mild   Fracture of tibia with fibula, closed, right, sequela 10/16/2017   Peripheral vascular disease (HCC) 2004   varicose veins right leg   Tendonitis 10/2010   temporal tendonitis with locked jaw following tooth extraction   Vitamin B12 deficiency 09/2013    Surgical History: Past Surgical History:  Procedure Laterality Date   BACK SURGERY  2005   "DISC  REPLACEMENT"   L5-6    COLONOSCOPY  12/2011   TAx1, diverticulosis, rpt 5 yrs - no further colonoscopy done yet (Dr Myra Gianotti @ [facility])   CYSTOSCOPY W/ RETROGRADES Left 03/21/2022   Procedure: CYSTOSCOPY WITH RETROGRADE PYELOGRAM;  Surgeon: Riki Altes, MD;  Location: [facility];  Service: Urology;  Laterality: Left;   CYSTOSCOPY/URETEROSCOPY/HOLMIUM LASER/STENT PLACEMENT Left 03/21/2022   Procedure: CYSTOSCOPY/URETEROSCOPY/HOLMIUM LASER/STENT PLACEMENT;  Surgeon: Riki Altes, MD;  Location: [facility];  Service: Urology;  Laterality: Left;   KNEE ARTHROPLASTY Left 2012   TONSILLECTOMY     TOTAL KNEE ARTHROPLASTY  11/12/2011   Procedure: TOTAL KNEE ARTHROPLASTY;  Surgeon: Shelda Pal, MD; Laterality: Right   TOTAL KNEE ARTHROPLASTY Bilateral    VARICOSE VEIN SURGERY Right    sclerotherapy    Home Medications:   Allergies as of 05/03/2022       Reactions   Oxycodone Rash        Medication List        Accurate as of May 03, 2022  1:29 PM. If you have any questions, ask your nurse or doctor.          Armodafinil 150 MG tablet Take 1 tablet (150 mg total) by mouth daily.   doxycycline 100 MG capsule Commonly known as: VIBRAMYCIN Take 1 capsule (100 mg total) by mouth 2 (two) times daily.   HYDROcodone-acetaminophen 5-325 MG tablet Commonly known as: NORCO/VICODIN Take 1 tablet by mouth every 4 (four) hours as needed for moderate pain.   oxybutynin 5 MG tablet Commonly known as: DITROPAN 1 tab tid prn frequency,urgency, bladder spasm   predniSONE 20 MG tablet Commonly known as: DELTASONE Take 1 tablet (20 mg total) by mouth daily with breakfast for 5 days.        Allergies:  Allergies  Allergen Reactions   Oxycodone Rash    Family History: Family History  Problem Relation Age of Onset   Hyperlipidemia Mother    Hypertension Mother    Diabetes Mother    ALS Sister    Cancer Neg Hx    CAD Neg Hx    Stroke Neg Hx     Social History:  reports that he has never smoked. He has never used smokeless tobacco. He reports current alcohol use. He reports that he does not use drugs.   Physical Exam: BP Marland Kitchen)  165/86   Pulse 99   Ht 6' (1.829 m)   Wt (!) 339 lb (153.8 kg)   BMI 45.98 kg/m   Constitutional:  Alert, No acute distress. Psychiatric: Normal mood and affect.   Assessment & Plan:    1.  Uric acid nephrolithiasis Metabolic evaluation recommended to include 24-hour urine/blood work He has elected to proceed with further evaluation and ordered through [facility] He will be notified with the results and further recommendations at that time   Riki Altes, MD  [facility] [address], [address] [place], [place] [address] [phone]

## 2022-05-03 NOTE — Patient Instructions (Signed)
[facility] Instructions [facility]   You will receive a box/kit in the mail that will have a urine jug and instructions in the kit.  When the box arrives you will need to [place] our office [phone] to schedule a LAB appointment.   You will need to do a 24hour urine and this should be done during the days that our office will be open.  For example any day from Sunday through Thursday.   If you take Vitamin C 100mg or greater please stop this 5 days prior to collection.   How to collect the urine sample: On the day you start the urine sample this 1st morning urine should NOT be collected.  For the rest of the day including all night urines should be collected.  On the next morning the 1st urine should be collected and then you will be finished with the urine collections.   You will need to bring the box with you on your LAB appointment day after urine has been collected and all instructions are complete in the box.  Your blood will be drawn and the box will be collected by our Lab employee to be sent off for analysis.   When urine and blood is complete you will need to schedule a follow up appointment for lab results.  

## 2022-05-04 ENCOUNTER — Encounter: Payer: Self-pay | Admitting: Urology

## 2022-05-29 ENCOUNTER — Ambulatory Visit
Admission: EM | Admit: 2022-05-29 | Discharge: 2022-05-29 | Disposition: A | Discharge status: Home / Self Care | Payer: No Typology Code available for payment source | Attending: Emergency Medicine | Admitting: Emergency Medicine

## 2022-05-29 DIAGNOSIS — R059 Cough, unspecified: Secondary | ICD-10-CM

## 2022-05-29 MED ORDER — PROMETHAZINE-DM 6.25-15 MG/5ML PO SYRP: 5.0000 mL | ORAL_SOLUTION | Freq: Every evening | ORAL | 0 refills | Status: DC | PRN

## 2022-05-29 MED ORDER — BENZONATATE 100 MG PO CAPS: 100.0000 mg | ORAL_CAPSULE | Freq: Three times a day (TID) | ORAL | 0 refills | Status: DC | PRN

## 2022-05-29 NOTE — ED Provider Notes (Signed)
Steve Lynch    CSN: [id number] Arrival date & time: 05/29/22  8185      History   Chief Complaint Chief Complaint  Patient presents with   Cough    HPI Steve Lynch is a 66 y.o. male.  Patient presents with ongoing nonproductive cough x1 month.  He denies fever, chills, earache, sore throat, chest pain, shortness of breath, vomiting, diarrhea, or other symptoms.  His cough is worse at night and is keeping his wife awake.  Patient has OSA and is on CPAP.  Patient was seen at this urgent care on 04/30/2022; diagnosed with acute bronchitis; treated with doxycycline and prednisone.  His medical history includes chronic cough, fatigue, vitamin B12 deficiency, dyslipidemia, morbid obesity, BPH, leg edema, depression, essential tremor.  The history is provided by the patient and medical records.    Past Medical History:  Diagnosis Date   Crushing injury of left thumb 09/15/2017   Drug abuse in remission (HCC) 1990s   Cocaine with rehab   Dyslipidemia    mild   Fracture of tibia with fibula, closed, right, sequela 10/16/2017   Peripheral vascular disease (HCC) 2004   varicose veins right leg   Tendonitis 10/2010   temporal tendonitis with locked jaw following tooth extraction   Vitamin B12 deficiency 09/2013    Patient Active Problem List   Diagnosis Date Noted   Advanced directives, counseling/discussion 02/19/2022   Low testosterone in male 01/23/2021   Weakness of both lower extremities 01/23/2021   Chronic heel pain, left 07/10/2020   Subconjunctival hemorrhage of left eye 01/22/2020   Paresthesias 01/22/2020   Intertrigo 01/22/2020   Fracture of tibia with fibula, closed, right, sequela 10/16/2017   Essential tremor 10/14/2017   MDD (major depressive disorder), single episode, in full remission (HCC) 04/02/2017   OSA on CPAP 08/13/2016   Obesity, morbid, BMI 40.0-49.9 (HCC) 08/13/2016   BPH associated with nocturia 08/13/2016   Leg edema, right 08/13/2016    Dyslipidemia    Health maintenance examination 10/01/2013   Vitamin B12 deficiency 09/13/2013   Chronic cough 08/16/2013   Fatigue 08/16/2013    Past Surgical History:  Procedure Laterality Date   BACK SURGERY  2005   "DISC  REPLACEMENT"   L5-6    COLONOSCOPY  12/2011   TAx1, diverticulosis, rpt 5 yrs - no further colonoscopy done yet (Dr Myra Gianotti @ [facility])   CYSTOSCOPY W/ RETROGRADES Left 03/21/2022   Procedure: CYSTOSCOPY WITH RETROGRADE PYELOGRAM;  Surgeon: Riki Altes, MD;  Location: [facility] OR[facility];  Service: Urology;  Laterality: Left;   CYSTOSCOPY/URETEROSCOPY/HOLMIUM LASER/STENT PLACEMENT Left 03/21/2022   Procedure: CYSTOSCOPY/URETEROSCOPY/HOLMIUM LASER/STENT PLACEMENT;  Surgeon: Riki Altes, MD;  Location: [facility];  Service: Urology;  Laterality: Left;   KNEE ARTHROPLASTY Left 2012   TONSILLECTOMY     TOTAL KNEE ARTHROPLASTY  11/12/2011   Procedure: TOTAL KNEE ARTHROPLASTY;  Surgeon: Shelda Pal, MD; Laterality: Right   TOTAL KNEE ARTHROPLASTY Bilateral    VARICOSE VEIN SURGERY Right    sclerotherapy       Home Medications    Prior to Admission medications   Medication Sig Start Date End Date Taking? Authorizing Provider  benzonatate (TESSALON) 100 MG capsule Take 1 capsule (100 mg total) by mouth 3 (three) times daily as needed for cough. 05/29/22  Yes Mickie Bail, NP  promethazine-dextromethorphan (PROMETHAZINE-DM) 6.25-15 MG/5ML syrup Take 5 mLs by mouth at bedtime as needed for cough. 05/29/22  Yes Mickie Bail, NP  Armodafinil 150 MG tablet Take 1 tablet (150 mg total) by mouth daily. 03/19/22   Tomma Lightning, MD  HYDROcodone-acetaminophen (NORCO/VICODIN) 5-325 MG tablet Take 1 tablet by mouth every 4 (four) hours as needed for moderate pain. 03/21/22   Riki Altes, MD  oxybutynin (DITROPAN) 5 MG tablet 1 tab tid prn frequency,urgency, bladder spasm 03/21/22   Stoioff, Verna Czech, MD    Family History Family History  Problem Relation Age of  Onset   Hyperlipidemia Mother    Hypertension Mother    Diabetes Mother    ALS Sister    Cancer Neg Hx    CAD Neg Hx    Stroke Neg Hx     Social History Social History   Tobacco Use   Smoking status: Never   Smokeless tobacco: Never  Vaping Use   Vaping Use: Never used  Substance Use Topics   Alcohol use: Yes    Comment: Very rare   Drug use: No    Comment: COCAINE ADDICTION WITH REHAB   21 YEARS AGO     Allergies   Oxycodone   Review of Systems Review of Systems  Constitutional:  Negative for chills and fever.  HENT:  Negative for ear pain and sore throat.   Respiratory:  Positive for cough. Negative for shortness of breath.   Cardiovascular:  Negative for chest pain and palpitations.  Gastrointestinal:  Negative for diarrhea and vomiting.  Skin:  Negative for color change and rash.  All other systems reviewed and are negative.    Physical Exam Triage Vital Signs ED Triage Vitals  Enc Vitals Group     BP 05/29/22 0937 128/87     Pulse Rate 05/29/22 0937 92     Resp 05/29/22 0937 18     Temp 05/29/22 0937 98.2 F (36.8 C)     Temp src --      SpO2 05/29/22 0937 96 %     Weight 05/29/22 0942 (!) 340 lb (154.2 kg)     Height 05/29/22 0942 6\' 4"  (1.93 m)     Head Circumference --      Peak Flow --      Pain Score 05/29/22 0941 0     Pain Loc --      Pain Edu? --      Excl. in [place]? --    No data found.  Updated Vital Signs BP 128/87   Pulse 92   Temp 98.2 F (36.8 C)   Resp 18   Ht 6\' 4"  (1.93 m)   Wt (!) 340 lb (154.2 kg)   SpO2 96%   BMI 41.39 kg/m   Visual Acuity Right Eye Distance:   Left Eye Distance:   Bilateral Distance:    Right Eye Near:   Left Eye Near:    Bilateral Near:     Physical Exam Vitals and nursing note reviewed.  Constitutional:      General: He is not in acute distress.    Appearance: He is well-developed. He is not ill-appearing.  HENT:     Right Ear: Tympanic membrane normal.     Left Ear: Tympanic membrane  normal.     Nose: Nose normal.     Mouth/Throat:     Mouth: Mucous membranes are moist.     Pharynx: Oropharynx is clear.  Cardiovascular:     Rate and Rhythm: Normal rate and regular rhythm.     Heart sounds: Normal heart sounds.  Pulmonary:  Effort: Pulmonary effort is normal. No respiratory distress.     Breath sounds: Normal breath sounds.  Musculoskeletal:     Cervical back: Neck supple.  Skin:    General: Skin is warm and dry.  Neurological:     Mental Status: He is alert.  Psychiatric:        Mood and Affect: Mood normal.        Behavior: Behavior normal.      UC Treatments / Results  Labs (all labs ordered are listed, but only abnormal results are displayed) Labs Reviewed - No data to display  EKG   Radiology No results found.  Procedures Procedures (including critical care time)  Medications Ordered in UC Medications - No data to display  Initial Impression / Assessment and Plan / UC Course  I have reviewed the triage vital signs and the nursing notes.  Pertinent labs & imaging results that were available during my care of the patient were reviewed by me and considered in my medical decision making (see chart for details).    Cough.  Patient declines CXR.  Discussed symptomatic treatment including Zyrtec or Xyzal.  Treating cough with Tessalon Perles for daytime and Promethazine DM for nighttime use.  Precautions for drowsiness with Promethazine DM discussed.  Instructed patient to follow-up with his PCP if his symptoms are not improving.  Education provided on cough.  Patient agrees to plan of care.     Final Clinical Impressions(s) / UC Diagnoses   Final diagnoses:  Cough, unspecified type     Discharge Instructions      Take the Tessalon Perles and Promethazine DM as directed.    The Promethazine DM cough syrup may cause drowsiness so do not drive, operate machinery, or drink alcohol with this medication.   Take Zyrtec or Xyzal daily at  bedtime for 2 weeks also for your symptoms.    Follow up with your primary care provider if your symptoms are not improving.        ED Prescriptions     Medication Sig Dispense Auth. Provider   benzonatate (TESSALON) 100 MG capsule Take 1 capsule (100 mg total) by mouth 3 (three) times daily as needed for cough. 21 capsule Mickie Bail, NP   promethazine-dextromethorphan (PROMETHAZINE-DM) 6.25-15 MG/5ML syrup Take 5 mLs by mouth at bedtime as needed for cough. 60 mL Mickie Bail, NP      PDMP not reviewed this encounter.   Mickie Bail, NP 05/29/22 1017

## 2022-05-29 NOTE — ED Triage Notes (Signed)
[facility] to urgent acre with complaints of reoccurring cough, reports worst at night and first thing in the morning. Patient reports cough is mostly dry, at times productive with little sputum.   Was seen for the same 7/18 and completed steroid and doxycycline. States cough initially improved but has since returned.  Denies other symptoms.

## 2022-05-29 NOTE — Discharge Instructions (Addendum)
Take the Tessalon Perles and Promethazine DM as directed.    The Promethazine DM cough syrup may cause drowsiness so do not drive, operate machinery, or drink alcohol with this medication.   Take Zyrtec or Xyzal daily at bedtime for 2 weeks also for your symptoms.    Follow up with your primary care provider if your symptoms are not improving.

## 2022-05-30 ENCOUNTER — Telehealth: Payer: Self-pay | Admitting: Family Medicine

## 2022-05-30 NOTE — Telephone Encounter (Signed)
Is this something you can do or does this need to be forwarded to billing?

## 2022-05-30 NOTE — Telephone Encounter (Signed)
Patient called in stating that he had blood work done May 9th and it was supposed to be sent over to [facility]. Patient would like for his results to be sent over to [facility]. He will need this done by Sept. 1st and would like a phone Giannotti or some type of notification when completed. Thank you!

## 2022-06-03 ENCOUNTER — Encounter: Payer: Self-pay | Admitting: Family Medicine

## 2022-06-03 NOTE — Telephone Encounter (Addendum)
Labs drawn 02/13/2022 were for his yearly physical. Ok to have primary code be z00.00 (health maintenance). I've changed.  I'm unclear how this would change his yearly premium.

## 2022-06-03 NOTE — Telephone Encounter (Signed)
Patient confirmed labs, labs were not marked as preventative at the time of collection  Patient needs it marked as  preventative in order to receive his discount for the upcoming fiscal year insurance discount  Patient is going to email the Physician order form to our [place] email address

## 2022-06-03 NOTE — Telephone Encounter (Signed)
Left a voicemail for this patient to let us know if he needed a copy of his labwork sent over to [facility].  Patient states this for his insurance discount premium, due in 8 days

## 2022-06-03 NOTE — Telephone Encounter (Signed)
Please advise if Dr. Reece Agar would be able to change the primary diagnosis for his labs on 5.3.23 to preventative   Patient states in 8 days his premium will go up if this isn't done.  Please advise.

## 2022-06-03 NOTE — Telephone Encounter (Signed)
Update: Patient had his physical with Dr. Reece Agar on 5.9.23. His labs were drawn on 5.3.23 and resulted via [facility]  Those labs were sent to [facility]  Labs at the end of May were in relation to an ED visit on 5.25.23 and hospital admission after, not his labs for physical  Left a voicemail for the patient to let him know.

## 2022-06-11 NOTE — Telephone Encounter (Signed)
Letter and labs emailed back to the patient and his [employer] HR representative @ [email or web address]

## 2022-06-11 NOTE — Telephone Encounter (Signed)
Fwd to Amy

## 2022-06-11 NOTE — Telephone Encounter (Addendum)
Signed Physician Results Form and in Lisa's box.  He needs to sign himself, also need updated waist circumference.

## 2022-06-20 ENCOUNTER — Other Ambulatory Visit: Payer: Self-pay | Admitting: *Deleted

## 2022-06-20 DIAGNOSIS — N200 Calculus of kidney: Secondary | ICD-10-CM

## 2022-09-25 NOTE — Telephone Encounter (Signed)
Error

## 2022-10-16 ENCOUNTER — Telehealth: Payer: Self-pay | Admitting: Family Medicine

## 2022-10-16 DIAGNOSIS — E785 Hyperlipidemia, unspecified: Secondary | ICD-10-CM

## 2022-10-16 DIAGNOSIS — R7989 Other specified abnormal findings of blood chemistry: Secondary | ICD-10-CM

## 2022-10-16 DIAGNOSIS — N289 Disorder of kidney and ureter, unspecified: Secondary | ICD-10-CM

## 2022-10-16 DIAGNOSIS — E538 Deficiency of other specified B group vitamins: Secondary | ICD-10-CM

## 2022-10-16 DIAGNOSIS — N401 Enlarged prostate with lower urinary tract symptoms: Secondary | ICD-10-CM

## 2022-10-16 NOTE — Telephone Encounter (Signed)
Last Testosterone test 02/12/21 for CPE, was not done in 2023  Please advise, thank you!

## 2022-10-16 NOTE — Telephone Encounter (Signed)
Pt called to schedule his cpe for march. Pt is asking can Dr. Darnell Level test his testosterone? Fuhrmann back # [phone]

## 2022-10-22 IMAGING — CR DG ABDOMEN 1V
1 series · 2 of 2 positions shown · non-contrast
Comparison: CT scan 11/09/2020

CLINICAL DATA: Known left kidney stone for 1 week.

EXAM:
ABDOMEN - 1 VIEW

[Series 1: dg abd 1 view · 0.14mm/px · 2 of 2 slices shown]
[im 1/2]
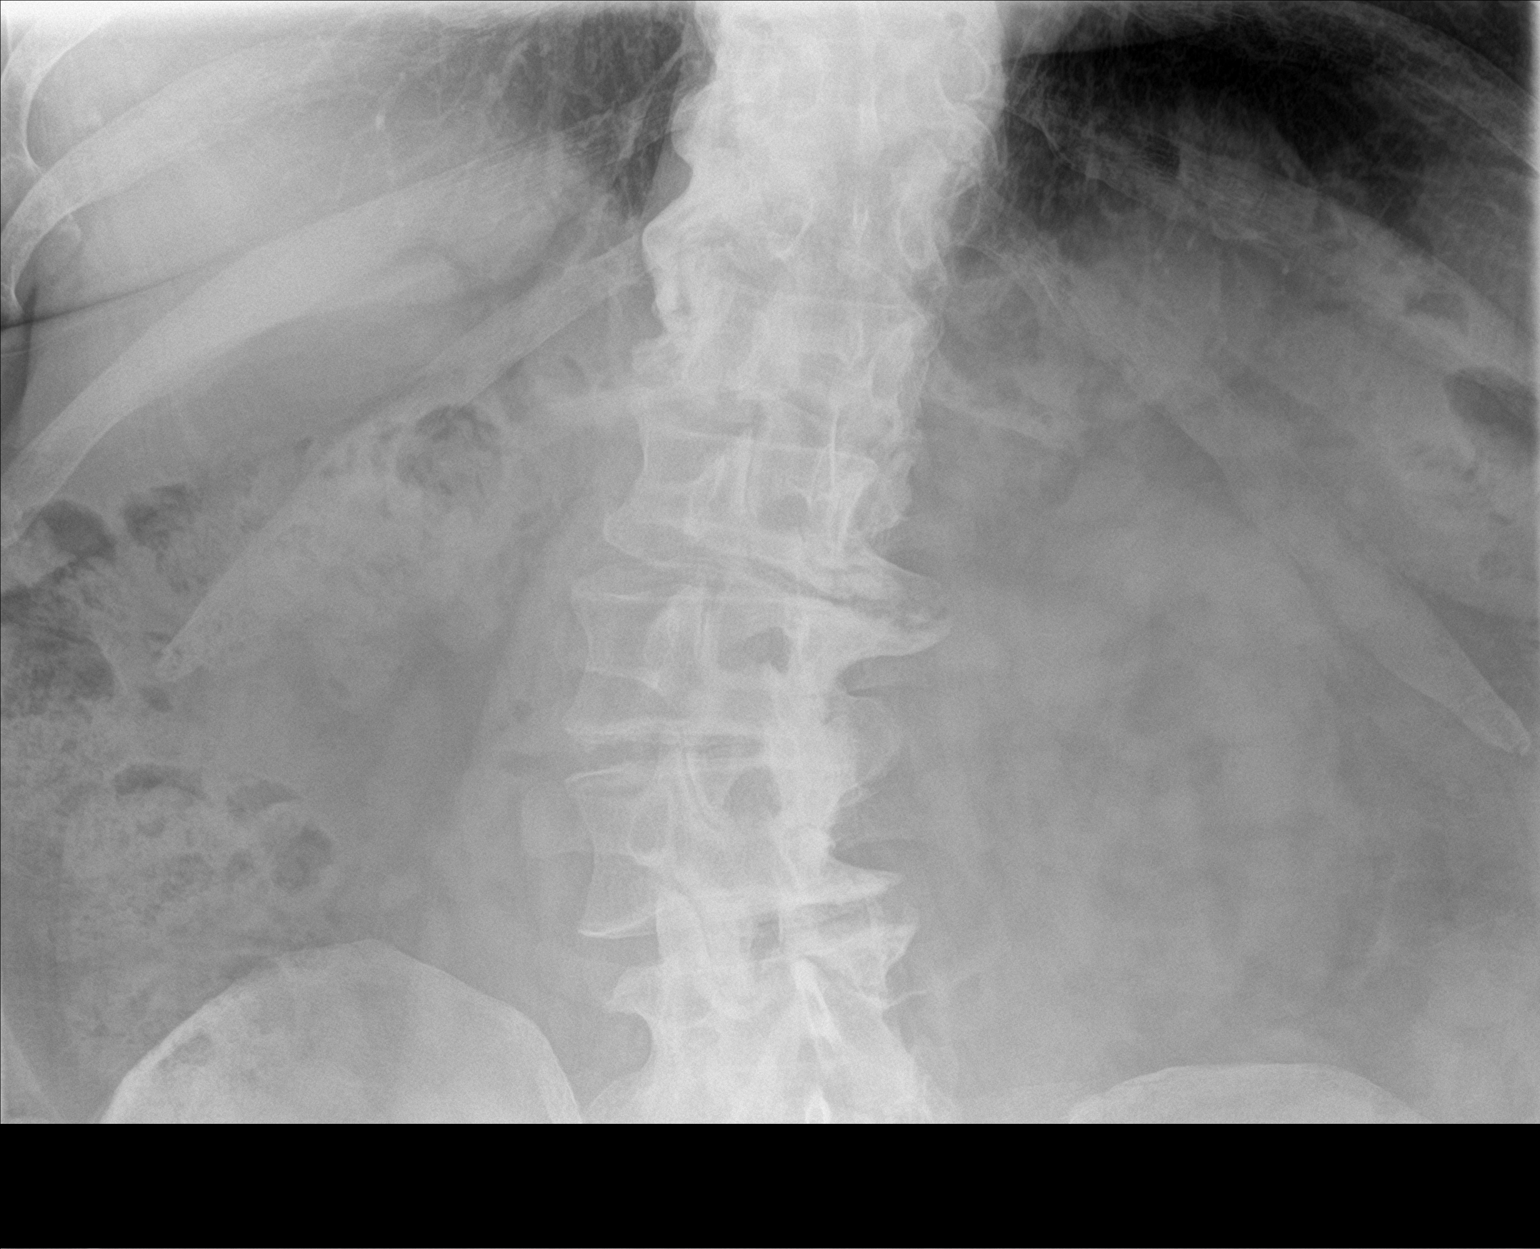
[im 2/2]
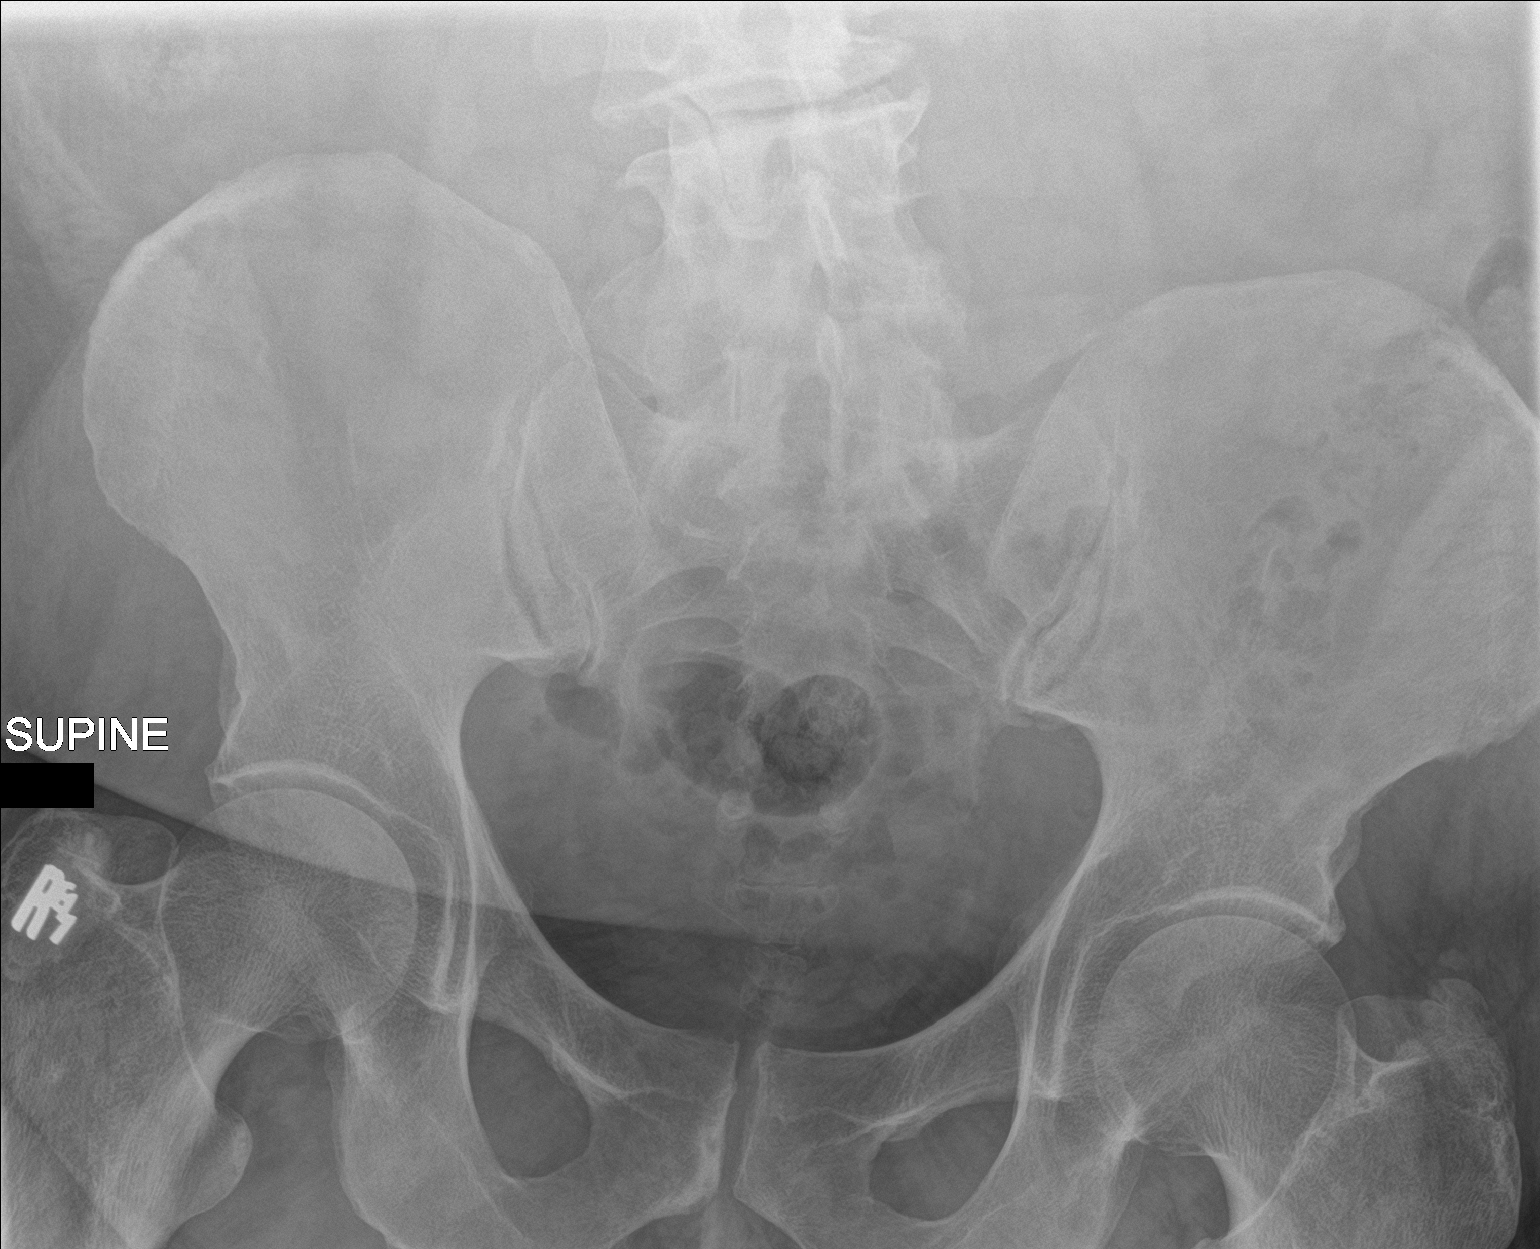

[2 of 2 positions shown; findings below may reference images not displayed]

FINDINGS: The bowel gas pattern is unremarkable.

Scoliosis and severe degenerative lumbar spondylosis.

No definite renal or ureteral calculi are demonstrated. The calculi
were quite small on the recent CT scan.
IMPRESSION: No definite renal or ureteral calculi.

## 2022-10-28 NOTE — Telephone Encounter (Signed)
Ok to check - added.

## 2022-10-28 NOTE — Addendum Note (Signed)
Addended by: Ria Bush on: 10/28/2022 07:49 AM   Modules accepted: Orders

## 2022-11-11 IMAGING — US US RENAL
1 series · 14 of 25 positions shown · non-contrast
Comparison: Noncontrast CT 11/09/2020, abdominal radiograph
11/16/2020

CLINICAL DATA: Probable passed calculus. Assess for persistent
hydronephrosis.

EXAM:
RENAL / URINARY TRACT ULTRASOUND COMPLETE

[Series 1: us renal · 0.26mm/px · 14 of 42 slices shown]
[im 1/42]
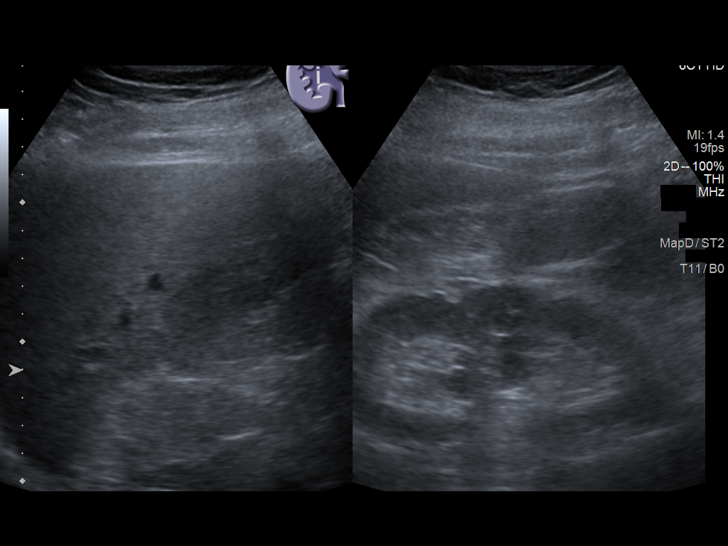
[im 4/42]
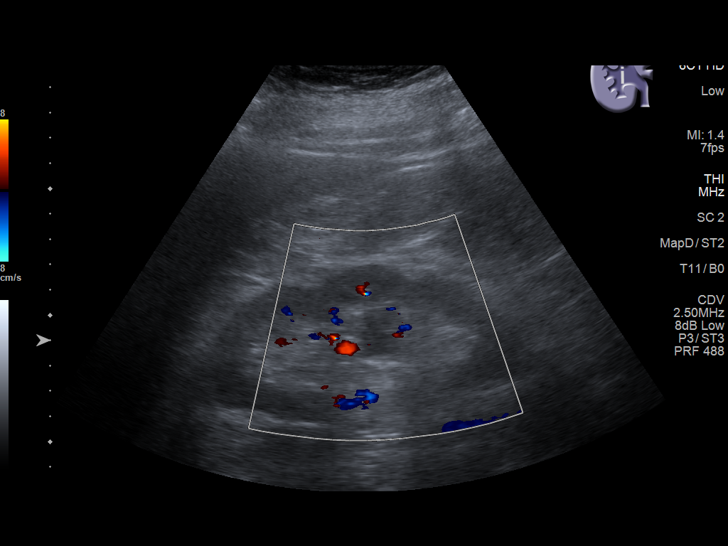
[im 7/42]
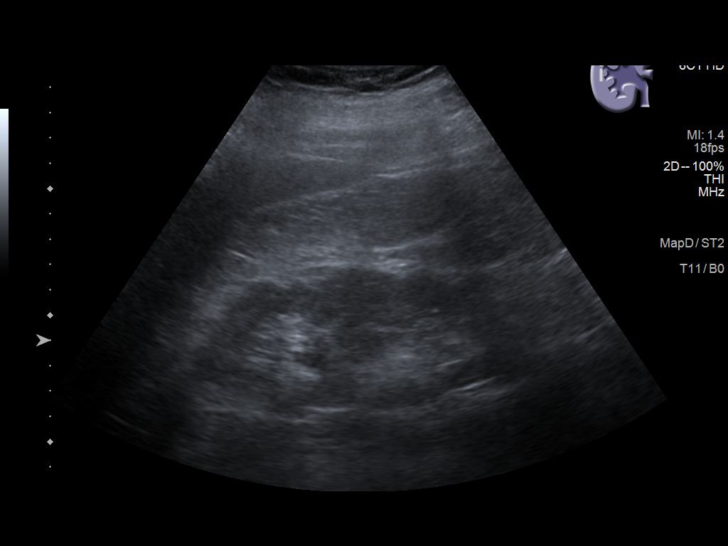
[im 11/42]
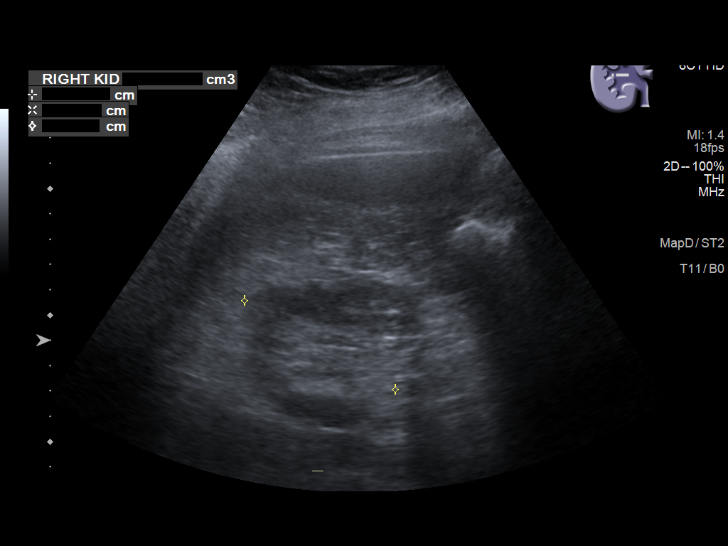
[im 14/42]
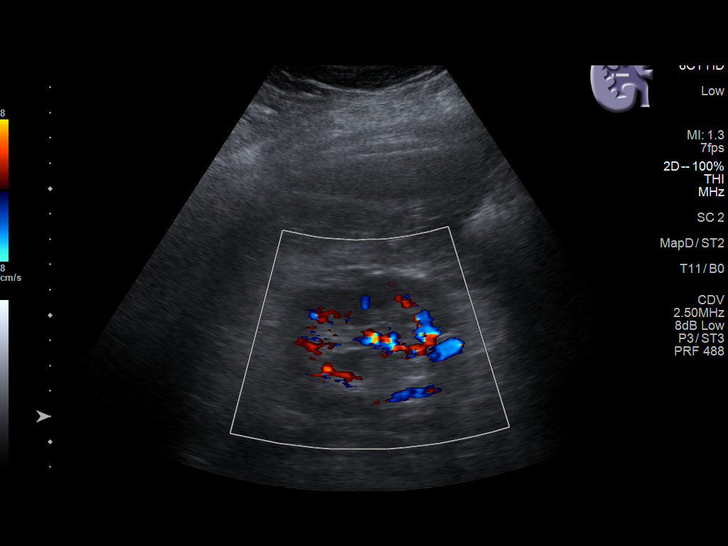
[im 16/42]
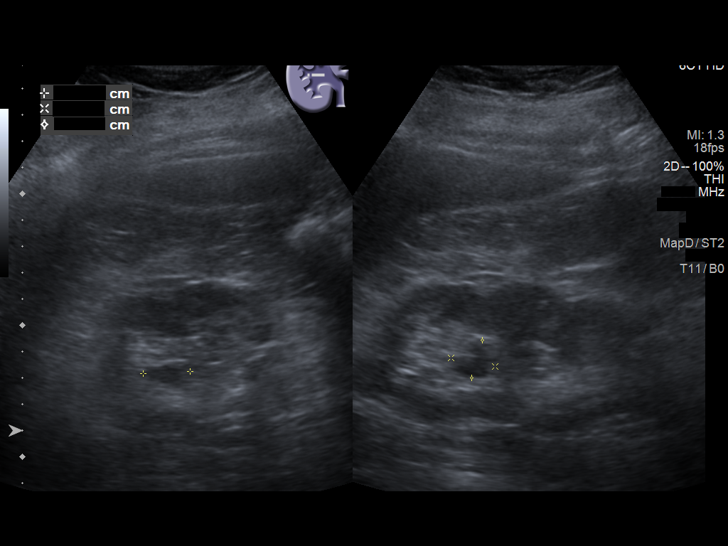
[im 19/42]
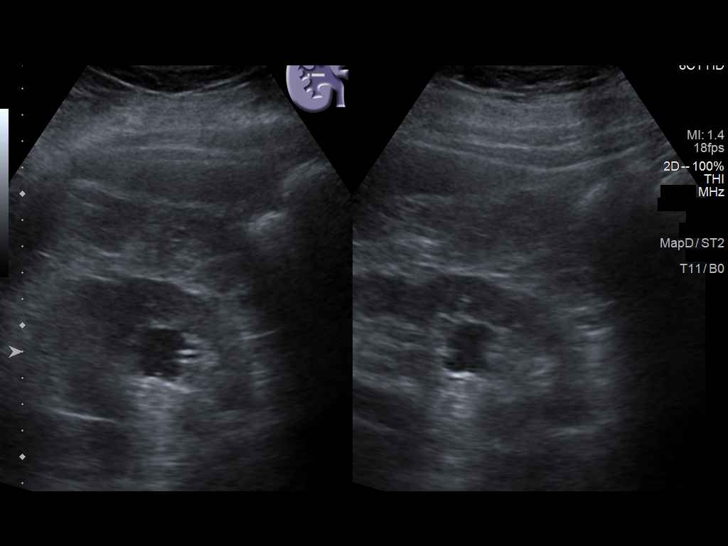
[im 23/42]
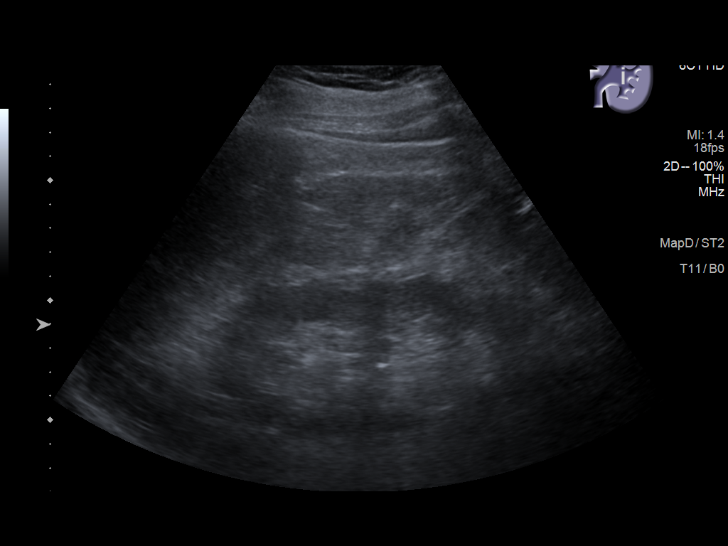
[im 26/42]
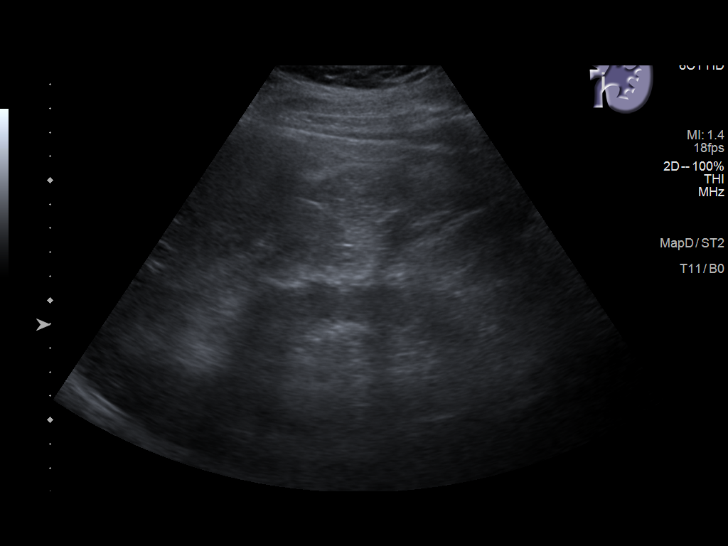
[im 28/42]
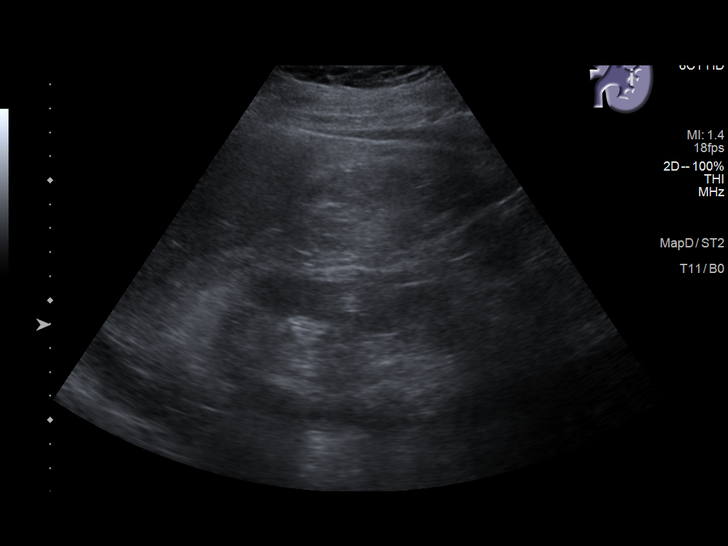
[im 31/42]
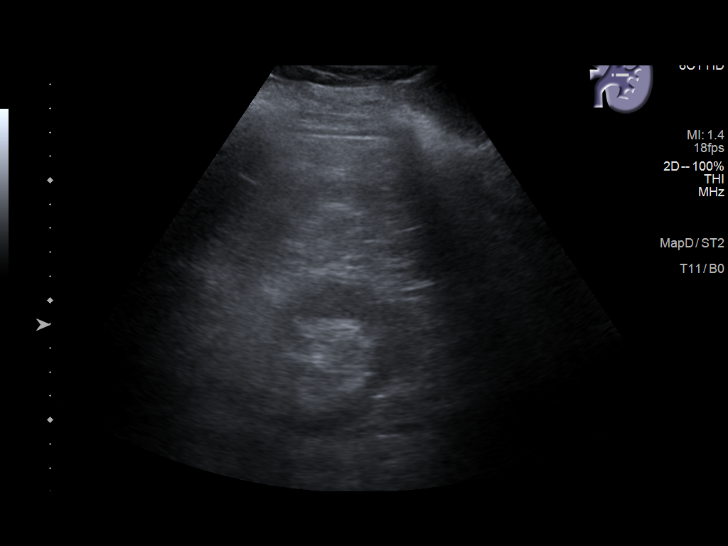
[im 35/42]
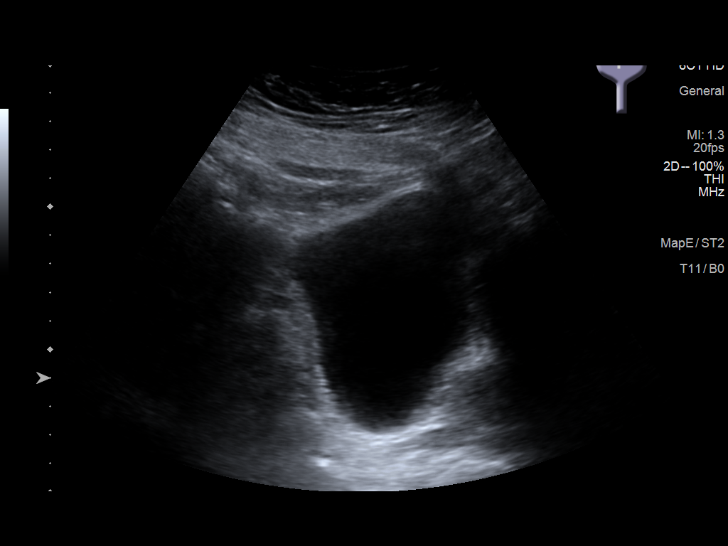
[im 38/42]
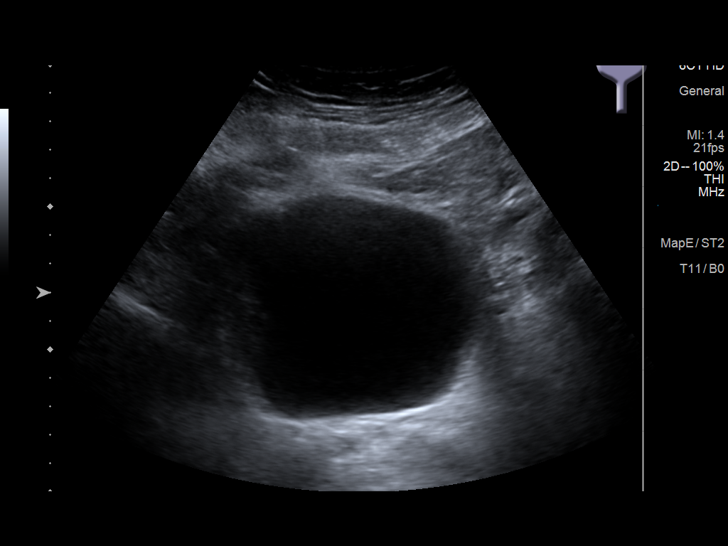
[im 42/42]
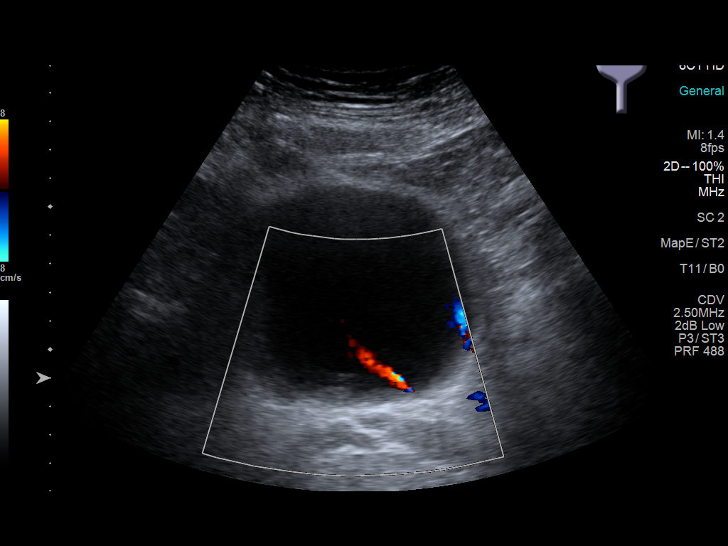

[14 of 25 positions shown; findings below may reference images not displayed]

FINDINGS: Right Kidney:

Renal measurements: 13.4 x 5.9 x 6.9 cm = volume: 283 mL. No
hydronephrosis. No visualized renal calculi. Central cyst measures
1.8 x 1.7 x 1.5 cm. No additional renal sinus cyst measures 2 x
x 2.4 cm.

Left Kidney:

Renal measurements: 13.3 x 6.6 x 6.7 cm = volume: 3 of 5 mL. No
hydronephrosis. Small stones in the upper and lower left kidney on
prior CT is not visualized by ultrasound. No evidence of focal
lesion.

Bladder:

Appears normal for degree of bladder distention. Both ureteral jets
are visualized.

Other:

None.
IMPRESSION: 1. No hydronephrosis.
2. Left renal stones on prior CT are not seen by ultrasound.
3. Right renal cysts.

## 2022-12-09 ENCOUNTER — Other Ambulatory Visit (INDEPENDENT_AMBULATORY_CARE_PROVIDER_SITE_OTHER): Payer: No Typology Code available for payment source

## 2022-12-09 DIAGNOSIS — R7989 Other specified abnormal findings of blood chemistry: Secondary | ICD-10-CM

## 2022-12-09 DIAGNOSIS — N289 Disorder of kidney and ureter, unspecified: Secondary | ICD-10-CM | POA: Diagnosis not present

## 2022-12-09 DIAGNOSIS — E538 Deficiency of other specified B group vitamins: Secondary | ICD-10-CM | POA: Diagnosis not present

## 2022-12-09 DIAGNOSIS — R351 Nocturia: Secondary | ICD-10-CM | POA: Diagnosis not present

## 2022-12-09 DIAGNOSIS — N401 Enlarged prostate with lower urinary tract symptoms: Secondary | ICD-10-CM | POA: Diagnosis not present

## 2022-12-09 DIAGNOSIS — E785 Hyperlipidemia, unspecified: Secondary | ICD-10-CM

## 2022-12-10 LAB — CBC WITH DIFFERENTIAL/PLATELET
Basophils Absolute: 0.1 10*3/uL (ref 0.0–0.1)
Basophils Relative: 1 % (ref 0.0–3.0)
Eosinophils Absolute: 0.4 10*3/uL (ref 0.0–0.7)
Eosinophils Relative: 4.7 % (ref 0.0–5.0)
HCT: 40.9 % (ref 39.0–52.0)
Hemoglobin: 14.2 g/dL (ref 13.0–17.0)
Lymphocytes Relative: 16.2 % (ref 12.0–46.0)
Lymphs Abs: 1.2 10*3/uL (ref 0.7–4.0)
MCHC: 34.8 g/dL (ref 30.0–36.0)
MCV: 89 fl (ref 78.0–100.0)
Monocytes Absolute: 0.6 10*3/uL (ref 0.1–1.0)
Monocytes Relative: 8.6 % (ref 3.0–12.0)
Neutro Abs: 5.2 10*3/uL (ref 1.4–7.7)
Neutrophils Relative %: 69.5 % (ref 43.0–77.0)
Platelets: 239 10*3/uL (ref 150.0–400.0)
RBC: 4.6 Mil/uL (ref 4.22–5.81)
RDW: 13.4 % (ref 11.5–15.5)
WBC: 7.5 10*3/uL (ref 4.0–10.5)

## 2022-12-10 LAB — PSA: PSA: 2.44 ng/mL (ref 0.10–4.00)

## 2022-12-10 LAB — COMPREHENSIVE METABOLIC PANEL
ALT: 11 U/L (ref 0–53)
AST: 14 U/L (ref 0–37)
Albumin: 3.7 g/dL (ref 3.5–5.2)
Alkaline Phosphatase: 92 U/L (ref 39–117)
BUN: 25 mg/dL — ABNORMAL HIGH (ref 6–23)
CO2: 26 mEq/L (ref 19–32)
Calcium: 8.8 mg/dL (ref 8.4–10.5)
Chloride: 107 mEq/L (ref 96–112)
Creatinine, Ser: 1.42 mg/dL (ref 0.40–1.50)
GFR: 51.99 mL/min — ABNORMAL LOW (ref >60.00)
Glucose, Bld: 74 mg/dL (ref 70–99)
Potassium: 3.8 mEq/L (ref 3.5–5.1)
Sodium: 141 mEq/L (ref 135–145)
Total Bilirubin: 0.5 mg/dL (ref 0.2–1.2)
Total Protein: 6.7 g/dL (ref 6.0–8.3)

## 2022-12-10 LAB — LIPID PANEL
Cholesterol: 151 mg/dL (ref 0–200)
HDL: 33.5 mg/dL — ABNORMAL LOW (ref >39.00)
LDL Cholesterol: 93 mg/dL (ref 0–99)
NonHDL: 117.67
Total CHOL/HDL Ratio: 5
Triglycerides: 124 mg/dL (ref 0.0–149.0)
VLDL: 24.8 mg/dL (ref 0.0–40.0)

## 2022-12-10 LAB — MICROALBUMIN / CREATININE URINE RATIO
Creatinine,U: 256.4 mg/dL
Microalb Creat Ratio: 0.4 mg/g (ref 0.0–30.0)
Microalb, Ur: 1.1 mg/dL (ref 0.0–1.9)

## 2022-12-10 LAB — TESTOSTERONE: Testosterone: 194.64 ng/dL — ABNORMAL LOW (ref 300.00–890.00)

## 2022-12-10 LAB — VITAMIN D 25 HYDROXY (VIT D DEFICIENCY, FRACTURES): VITD: 14.52 ng/mL — ABNORMAL LOW (ref 30.00–100.00)

## 2022-12-10 LAB — VITAMIN B12: Vitamin B-12: 325 pg/mL (ref 211–911)

## 2022-12-16 ENCOUNTER — Ambulatory Visit (INDEPENDENT_AMBULATORY_CARE_PROVIDER_SITE_OTHER): Payer: No Typology Code available for payment source | Admitting: Family Medicine

## 2022-12-16 ENCOUNTER — Encounter: Payer: Self-pay | Admitting: Family Medicine

## 2022-12-16 VITALS — BP 130/84 | HR 93 | Temp 97.2°F | Ht 75.0 in | Wt 355.0 lb

## 2022-12-16 DIAGNOSIS — E785 Hyperlipidemia, unspecified: Secondary | ICD-10-CM

## 2022-12-16 DIAGNOSIS — G4733 Obstructive sleep apnea (adult) (pediatric): Secondary | ICD-10-CM

## 2022-12-16 DIAGNOSIS — Z0000 Encounter for general adult medical examination without abnormal findings: Secondary | ICD-10-CM | POA: Diagnosis not present

## 2022-12-16 DIAGNOSIS — E538 Deficiency of other specified B group vitamins: Secondary | ICD-10-CM

## 2022-12-16 DIAGNOSIS — R600 Localized edema: Secondary | ICD-10-CM

## 2022-12-16 DIAGNOSIS — Z7189 Other specified counseling: Secondary | ICD-10-CM | POA: Diagnosis not present

## 2022-12-16 DIAGNOSIS — R5382 Chronic fatigue, unspecified: Secondary | ICD-10-CM

## 2022-12-16 DIAGNOSIS — R413 Other amnesia: Secondary | ICD-10-CM | POA: Insufficient documentation

## 2022-12-16 DIAGNOSIS — E559 Vitamin D deficiency, unspecified: Secondary | ICD-10-CM

## 2022-12-16 DIAGNOSIS — F325 Major depressive disorder, single episode, in full remission: Secondary | ICD-10-CM

## 2022-12-16 DIAGNOSIS — R7989 Other specified abnormal findings of blood chemistry: Secondary | ICD-10-CM | POA: Diagnosis not present

## 2022-12-16 MED ORDER — CETIRIZINE HCL 10 MG PO TABS: 10.0000 mg | ORAL_TABLET | Freq: Every day | ORAL | 11 refills | Status: DC

## 2022-12-16 MED ORDER — CYANOCOBALAMIN 500 MCG PO TABS: 500.0000 ug | ORAL_TABLET | Freq: Every day | ORAL | Status: DC

## 2022-12-16 MED ORDER — VITAMIN D 50 MCG (2000 UT) PO CAPS: 1.0000 | ORAL_CAPSULE | Freq: Every day | ORAL | Status: DC

## 2022-12-16 MED ORDER — CYANOCOBALAMIN 1000 MCG/ML IJ SOLN
1000.0000 ug | Freq: Once | INTRAMUSCULAR | Status: AC
  Administered 2022-12-16: 1000 ug via INTRAMUSCULAR

## 2022-12-16 NOTE — Progress Notes (Unsigned)
Patient ID: Steve Lynch, male    DOB: 03/07/1956, 67 y.o.   MRN: [id number]  This visit was conducted in person.  BP 130/84   Pulse 93   Temp (!) 97.2 F (36.2 C) (Temporal)   Ht '6\' 3"'$  (1.905 m)   Wt (!) 355 lb (161 kg)   SpO2 95%   BMI 44.37 kg/m    CC: CPE Subjective:   HPI: Steve Lynch is a 67 y.o. male presenting on 12/16/2022 for Annual Exam   Attends [facility] [employer] in [place].  He states insurance covers 1 CPE per calendar year. Planning to retire in 14 wks, planning to get medicare.   Weight gain noted. Previous success following "Dirty Keto" diet. Followed diet through Thanksgiving 2022, dropped to 305lbs, but since has regained all the weight back. Heaviest he's been.    OSA - recently restarted auto CPAP (5-20) machine. Has seen pulm [place]. Notes ongoing difficulty with fatigue. Tried armodafinil in am without much benefit - finishing course. Notes he's oversleeping. Denies significant trouble with depressed mood. No sex drive.   Urate kidney stones - has seen uro Dr Bernardo Heater - was recommended metabolic evaluation but he hasn't completed yet.   Left shoulder pain - has seen ortho who recommended surgery - he declined at that time. Pain returning.   Chronic R leg swelling in h/o multiple surgeries to that leg.   Notes worsening short term memory over the past 6 months.    Preventative: COLONOSCOPY 12/2011 - TAx1, diverticulosis, rpt 5 yrs (Dr Lelon Huh @ [facility]) - discussed options. Referred to GI last 2 years, they were unable to reach patient despite phone calls and letter.  Prostate cancer screening - continues yearly PSA, has declined DRE. Denies prostate symptoms.  Lung cancer screening - not eligible  Flu shot - declines  COVID vaccine Pfizer 12/2019, 01/2020, booster 09/2020 Tdap 02/2022 RSV - discussed  Shingrix - 02/2021, 06/2021 Advanced directive - has this at home. Wife Otila Kluver then son are HCPOA. Asked to bring [place] copy of living  will.  Seat belt use discussed - doesn't use Sunscreen use discussed, no changing moles on skin, has seen derm.  Non smoker  Alcohol - rare  Dentist - LTR Q3 mo  Eye exam - yearly  Bowel - no constipation  Bladder -   Lives with wife, 1 dog Occupation: [employer] 12/2018, now working at [employer]  Activity: no regular exercise  Diet: some water, fruits/vegetables daily     Relevant past medical, surgical, family and social history reviewed and updated as indicated. Interim medical history since our last visit reviewed. Allergies and medications reviewed and updated. Outpatient Medications Prior to Visit  Medication Sig Dispense Refill   Armodafinil 150 MG tablet Take 1 tablet (150 mg total) by mouth daily. 30 tablet 1   benzonatate (TESSALON) 100 MG capsule Take 1 capsule (100 mg total) by mouth 3 (three) times daily as needed for cough. 21 capsule 0   HYDROcodone-acetaminophen (NORCO/VICODIN) 5-325 MG tablet Take 1 tablet by mouth every 4 (four) hours as needed for moderate pain. 8 tablet 0   oxybutynin (DITROPAN) 5 MG tablet 1 tab tid prn frequency,urgency, bladder spasm 10 tablet 0   promethazine-dextromethorphan (PROMETHAZINE-DM) 6.25-15 MG/5ML syrup Take 5 mLs by mouth at bedtime as needed for cough. 60 mL 0   No facility-administered medications prior to visit.     Per HPI unless specifically indicated in ROS  section below Review of Systems  Constitutional:  Negative for activity change, appetite change, chills, fatigue, fever and unexpected weight change.  HENT:  Negative for hearing loss.   Eyes:  Negative for visual disturbance.  Respiratory:  Positive for shortness of breath. Negative for cough, chest tightness and wheezing.   Cardiovascular:  Positive for leg swelling (chronic right side). Negative for chest pain and palpitations.  Gastrointestinal:  Negative for abdominal distention, abdominal pain, blood in stool, constipation, diarrhea, nausea and  vomiting.  Genitourinary:  Negative for difficulty urinating and hematuria.  Musculoskeletal:  Negative for arthralgias, myalgias and neck pain.  Skin:  Negative for rash.  Neurological:  Negative for dizziness, seizures, syncope and headaches.  Hematological:  Negative for adenopathy. Does not bruise/bleed easily.  Psychiatric/Behavioral:  Positive for dysphoric mood. The patient is not nervous/anxious.     Objective:  BP 130/84   Pulse 93   Temp (!) 97.2 F (36.2 C) (Temporal)   Ht '6\' 3"'$  (1.905 m)   Wt (!) 355 lb (161 kg)   SpO2 95%   BMI 44.37 kg/m   Wt Readings from Last 3 Encounters:  12/16/22 (!) 355 lb (161 kg)  05/29/22 (!) 340 lb (154.2 kg)  05/03/22 (!) 339 lb (153.8 kg)      Physical Exam Vitals and nursing note reviewed.  Constitutional:      General: He is not in acute distress.    Appearance: Normal appearance. He is well-developed. He is not ill-appearing.  HENT:     Head: Normocephalic and atraumatic.     Right Ear: Hearing, tympanic membrane, ear canal and external ear normal.     Left Ear: Hearing, tympanic membrane, ear canal and external ear normal.     Mouth/Throat:     Mouth: Mucous membranes are moist.     Pharynx: Oropharynx is clear. No oropharyngeal exudate or posterior oropharyngeal erythema.  Eyes:     General: No scleral icterus.    Extraocular Movements: Extraocular movements intact.     Conjunctiva/sclera: Conjunctivae normal.     Pupils: Pupils are equal, round, and reactive to light.  Neck:     Thyroid: No thyroid mass or thyromegaly.  Cardiovascular:     Rate and Rhythm: Normal rate and regular rhythm.     Pulses: Normal pulses.          Radial pulses are 2+ on the right side and 2+ on the left side.     Heart sounds: Normal heart sounds. No murmur heard. Pulmonary:     Effort: Pulmonary effort is normal. No respiratory distress.     Breath sounds: Normal breath sounds. No wheezing, rhonchi or rales.  Abdominal:     General: Bowel  sounds are normal. There is no distension.     Palpations: Abdomen is soft. There is no mass.     Tenderness: There is no abdominal tenderness. There is no guarding or rebound.     Hernia: No hernia is present.  Musculoskeletal:        General: Normal range of motion.     Cervical back: Normal range of motion and neck supple.     Right lower leg: No edema.     Left lower leg: No edema.  Lymphadenopathy:     Cervical: No cervical adenopathy.  Skin:    General: Skin is warm and dry.     Findings: No rash.  Neurological:     General: No focal deficit present.     Mental  Status: He is alert and oriented to person, place, and time.  Psychiatric:        Mood and Affect: Mood normal.        Behavior: Behavior normal.        Thought Content: Thought content normal.        Judgment: Judgment normal.       Results for orders placed or performed in visit on 12/09/22  Microalbumin / creatinine urine ratio  Result Value Ref Range   Microalb, Ur 1.1 0.0 - 1.9 mg/dL   Creatinine,U 256.4 mg/dL   Microalb Creat Ratio 0.4 0.0 - 30.0 mg/g  VITAMIN D 25 Hydroxy (Vit-D Deficiency, Fractures)  Result Value Ref Range   VITD 14.52 (L) 30.00 - 100.00 ng/mL  CBC with Differential/Platelet  Result Value Ref Range   WBC 7.5 4.0 - 10.5 K/uL   RBC 4.60 4.22 - 5.81 Mil/uL   Hemoglobin 14.2 13.0 - 17.0 g/dL   HCT 40.9 39.0 - 52.0 %   MCV 89.0 78.0 - 100.0 fl   MCHC 34.8 30.0 - 36.0 g/dL   RDW 13.4 11.5 - 15.5 %   Platelets 239.0 150.0 - 400.0 K/uL   Neutrophils Relative % 69.5 43.0 - 77.0 %   Lymphocytes Relative 16.2 12.0 - 46.0 %   Monocytes Relative 8.6 3.0 - 12.0 %   Eosinophils Relative 4.7 0.0 - 5.0 %   Basophils Relative 1.0 0.0 - 3.0 %   Neutro Abs 5.2 1.4 - 7.7 K/uL   Lymphs Abs 1.2 0.7 - 4.0 K/uL   Monocytes Absolute 0.6 0.1 - 1.0 K/uL   Eosinophils Absolute 0.4 0.0 - 0.7 K/uL   Basophils Absolute 0.1 0.0 - 0.1 K/uL  PSA  Result Value Ref Range   PSA 2.44 0.10 - 4.00 ng/mL   Testosterone  Result Value Ref Range   Testosterone 194.64 (L) 300.00 - 890.00 ng/dL  Vitamin B12  Result Value Ref Range   Vitamin B-12 325 211 - 911 pg/mL  Comprehensive metabolic panel  Result Value Ref Range   Sodium 141 135 - 145 mEq/L   Potassium 3.8 3.5 - 5.1 mEq/L   Chloride 107 96 - 112 mEq/L   CO2 26 19 - 32 mEq/L   Glucose, Bld 74 70 - 99 mg/dL   BUN 25 (H) 6 - 23 mg/dL   Creatinine, Ser 1.42 0.40 - 1.50 mg/dL   Total Bilirubin 0.5 0.2 - 1.2 mg/dL   Alkaline Phosphatase 92 39 - 117 U/L   AST 14 0 - 37 U/L   ALT 11 0 - 53 U/L   Total Protein 6.7 6.0 - 8.3 g/dL   Albumin 3.7 3.5 - 5.2 g/dL   GFR 51.99 (L) >60.00 mL/min   Calcium 8.8 8.4 - 10.5 mg/dL  Lipid panel  Result Value Ref Range   Cholesterol 151 0 - 200 mg/dL   Triglycerides 124.0 0.0 - 149.0 mg/dL   HDL 33.50 (L) >39.00 mg/dL   VLDL 24.8 0.0 - 40.0 mg/dL   LDL Cholesterol 93 0 - 99 mg/dL   Total CHOL/HDL Ratio 5    NonHDL 117.67     Assessment & Plan:   Problem List Items Addressed This Visit     Health maintenance examination - Primary (Chronic)    Preventative protocols reviewed and updated unless pt declined. Discussed healthy diet and lifestyle.       Advanced directives, counseling/discussion (Chronic)    Advanced directive - has this at home. Wife Otila Kluver then  son are HCPOA. Asked to bring [place] copy of living will.       Vitamin B12 deficiency   Low testosterone in male   Memory deficit     Meds ordered this encounter  Medications   cetirizine (ZYRTEC) 10 MG tablet    Sig: Take 1 tablet (10 mg total) by mouth daily.    Dispense:  30 tablet    Refill:  11   Cholecalciferol (VITAMIN D) 50 MCG (2000 UT) CAPS    Sig: Take 1 capsule (2,000 Units total) by mouth daily.    Dispense:  30 capsule   cyanocobalamin (V-R VITAMIN B-12) 500 MCG tablet    Sig: Take 1 tablet (500 mcg total) by mouth daily.   cyanocobalamin (VITAMIN B12) injection 1,000 mcg    No orders of the defined types were  placed in this encounter.   Patient Instructions  B12 shot today then start b12 575mg daily.  Start vitamin D 2000 units daily.  You may [facility] at [phone] to schedule an appointment.  Return at your convenience for 8am lab visit only.  Use compression stockings to R leg.  [place] of [place] living [place] to update chart.  Return in 6 months for welcome to medicare visit.  Schedule office visit sooner for formal memory evaluation  Follow up plan: Return in about 6 months (around 06/18/2023), or if symptoms worsen or fail to improve, for medicare wellness visit.  JRia Bush MD

## 2022-12-16 NOTE — Patient Instructions (Addendum)
B12 shot today then start b12 533mg daily.  Start vitamin D 2000 units daily.  You may [facility] at [phone] to schedule an appointment.  Return at your convenience for 8am lab visit only.  Use compression stockings to R leg.  Bring [place] copy of your living will to update chart.  Return in 6 months for welcome to medicare visit.  Schedule office visit sooner for formal memory evaluation

## 2022-12-16 NOTE — Assessment & Plan Note (Signed)
Preventative protocols reviewed and updated unless pt declined. Discussed healthy diet and lifestyle.  

## 2022-12-16 NOTE — Assessment & Plan Note (Signed)
Advanced directive - has this at home. Wife Otila Kluver then son are HCPOA. Asked to bring [place] copy of living will.

## 2022-12-17 ENCOUNTER — Other Ambulatory Visit (INDEPENDENT_AMBULATORY_CARE_PROVIDER_SITE_OTHER): Payer: No Typology Code available for payment source

## 2022-12-17 DIAGNOSIS — R413 Other amnesia: Secondary | ICD-10-CM | POA: Diagnosis not present

## 2022-12-17 DIAGNOSIS — R7989 Other specified abnormal findings of blood chemistry: Secondary | ICD-10-CM | POA: Diagnosis not present

## 2022-12-17 DIAGNOSIS — E559 Vitamin D deficiency, unspecified: Secondary | ICD-10-CM | POA: Insufficient documentation

## 2022-12-17 LAB — FOLLICLE STIMULATING HORMONE: FSH: 20.6 m[IU]/mL — ABNORMAL HIGH (ref 1.4–18.1)

## 2022-12-17 LAB — IBC PANEL
Iron: 91 ug/dL (ref 42–165)
Saturation Ratios: 34.9 % (ref 20.0–50.0)
TIBC: 260.4 ug/dL (ref 250.0–450.0)
Transferrin: 186 mg/dL — ABNORMAL LOW (ref 212.0–360.0)

## 2022-12-17 LAB — T4, FREE: Free T4: 0.87 ng/dL (ref 0.60–1.60)

## 2022-12-17 LAB — TSH: TSH: 1.97 u[IU]/mL (ref 0.35–5.50)

## 2022-12-17 LAB — LUTEINIZING HORMONE: LH: 9.91 m[IU]/mL — ABNORMAL HIGH (ref 1.50–9.30)

## 2022-12-17 NOTE — Assessment & Plan Note (Signed)
Check memory labs at upcoming lab appt. Rec return for formal memory evaluation.

## 2022-12-17 NOTE — Assessment & Plan Note (Signed)
Appreciate pulm care, compliant with nightly CPAP

## 2022-12-17 NOTE — Assessment & Plan Note (Signed)
Worried about low T - levels were low but not 8am draw.  Discussed weight gain can affect decline in T levels. Will return for 8am confirmatory labs.

## 2022-12-17 NOTE — Assessment & Plan Note (Signed)
Chronic, ongoing despite regular CPAP use.  Also endorses some depressed mood. See below re low T, as well as vit B12 and D replacement.

## 2022-12-17 NOTE — Assessment & Plan Note (Signed)
Remains in low normal range  B12 shot today then start b12 102mg daily.;

## 2022-12-17 NOTE — Assessment & Plan Note (Signed)
Support provided. Consider medication in the future.

## 2022-12-17 NOTE — Assessment & Plan Note (Signed)
Chronic, stable period off statin. Encouraged increased aerobic exercise to improve HDL. The 10-year ASCVD risk score (Arnett DK, et al., 2019) is: 13.5%   Values used to calculate the score:     Age: 67 years     Sex: Male     Is Non-Hispanic African American: No     Diabetic: No     Tobacco smoker: No     Systolic Blood Pressure: [id number]     Is BP treated: No     HDL Cholesterol: 33.5 mg/dL     Total Cholesterol: 151 mg/dL

## 2022-12-17 NOTE — Assessment & Plan Note (Signed)
Rec start vit D 2000 IU daily OTC.

## 2022-12-17 NOTE — Assessment & Plan Note (Signed)
Reviewed weight gain noted. Encouraged healthy diet and lifestyle choices to affect sustainable weight loss. Previously lost weight following ketogenic diet.

## 2022-12-21 ENCOUNTER — Encounter: Payer: Self-pay | Admitting: Family Medicine

## 2022-12-21 DIAGNOSIS — E291 Testicular hypofunction: Secondary | ICD-10-CM | POA: Insufficient documentation

## 2022-12-21 LAB — TESTOS,TOTAL,FREE AND SHBG (FEMALE)
Free Testosterone: 24.2 pg/mL — ABNORMAL LOW (ref 35.0–155.0)
Sex Hormone Binding: 38.8 nmol/L (ref 22–77)
Testosterone, Total, LC-MS-MS: 213 ng/dL — ABNORMAL LOW (ref 250–1100)

## 2022-12-21 LAB — PROLACTIN: Prolactin: 9 ng/mL (ref 2.0–18.0)

## 2022-12-21 LAB — RPR: RPR Ser Ql: NONREACTIVE

## 2022-12-21 LAB — CORTISOL-AM, BLOOD: Cortisol - AM: 13.6 ug/dL

## 2023-01-08 ENCOUNTER — Encounter: Payer: Self-pay | Admitting: Family Medicine

## 2023-01-08 ENCOUNTER — Ambulatory Visit: Payer: No Typology Code available for payment source | Admitting: Family Medicine

## 2023-01-08 VITALS — BP 128/84 | HR 91 | Temp 97.3°F | Ht 75.0 in | Wt 360.4 lb

## 2023-01-08 DIAGNOSIS — E291 Testicular hypofunction: Secondary | ICD-10-CM

## 2023-01-08 DIAGNOSIS — E559 Vitamin D deficiency, unspecified: Secondary | ICD-10-CM

## 2023-01-08 DIAGNOSIS — E538 Deficiency of other specified B group vitamins: Secondary | ICD-10-CM

## 2023-01-08 DIAGNOSIS — R413 Other amnesia: Secondary | ICD-10-CM | POA: Diagnosis not present

## 2023-01-08 DIAGNOSIS — R7989 Other specified abnormal findings of blood chemistry: Secondary | ICD-10-CM

## 2023-01-08 DIAGNOSIS — N1831 Chronic kidney disease, stage 3a: Secondary | ICD-10-CM

## 2023-01-08 DIAGNOSIS — R5382 Chronic fatigue, unspecified: Secondary | ICD-10-CM

## 2023-01-08 MED ORDER — TESTOSTERONE 20.25 MG/ACT (1.62%) TD GEL: 2.0000 | Freq: Every day | TRANSDERMAL | 3 refills | Status: DC

## 2023-01-08 NOTE — Patient Instructions (Addendum)
Memory testing overall doing well.  4 core lifestyle modifications to support a healthy mind:  1. Nutritious well balance diet.  2. Regular physical activity routine.  3. Regular mental activity such as reading books, word puzzles, math puzzles, jigsaw puzzles.  4. Social engagement.  Also ensure good blood pressure control, limit alcohol, no smoking.    Start testosterone cream 2 pumps daily in the morning to upper arms or as per instructions.  Return in 3 months for welcome to medicare visit, 1 week prior for fasting 8am labs.  You may [facility] at [phone] to schedule an appointment.

## 2023-01-08 NOTE — Progress Notes (Signed)
Patient ID: Steve Lynch, male    DOB: 1956/06/10, 67 y.o.   MRN: [id number]  This visit was conducted in person.  BP 128/84   Pulse 91   Temp (!) 97.3 F (36.3 C) (Temporal)   Ht 6\' 3"  (1.905 m)   Wt (!) 360 lb 6 oz (163.5 kg)   SpO2 95%   BMI 45.04 kg/m    CC: check memory Subjective:   HPI: Steve Lynch is a 67 y.o. male presenting on 01/08/2023 for Memory Loss (Here for memory assessment. )   Recent labwork consistent with primary hypogonadism with T 194, 213 on recheck, elevated LH 9.9, FSH 20.6. Other hormones (prolactin, T4, cortisol) returned normal.   Interested in testosterone replacement - has never been on this therapy.  Worsening L shoulder pain - may need to have RTC injury repaired - will return to see ortho Dr Steve Lynch.  Denies fmhx CAD.  TSH, RPR normal, B12 low normal.  Over the past week he notes increased energy and decreased need for afternoon naps since starting vit B12 and D replacement.   Ongoing memory trouble - trouble finding words he wants to say, also repeating himself and repeating questions. No trouble getting lost.   Geriatric Assessment: Mental Status Exam: 28/30 (value/max value)    Clock Drawing Score: 3/4     Relevant past medical, surgical, family and social history reviewed and updated as indicated. Interim medical history since our last visit reviewed. Allergies and medications reviewed and updated. Outpatient Medications Prior to Visit  Medication Sig Dispense Refill   cetirizine (ZYRTEC) 10 MG tablet Take 1 tablet (10 mg total) by mouth daily. (Patient taking differently: Take 10 mg by mouth daily. As needed) 30 tablet 11   Cholecalciferol (VITAMIN D) 50 MCG (2000 UT) CAPS Take 1 capsule (2,000 Units total) by mouth daily. 30 capsule    cyanocobalamin (V-R VITAMIN B-12) 500 MCG tablet Take 1 tablet (500 mcg total) by mouth daily.     No facility-administered medications prior to visit.     Per HPI unless specifically indicated in  ROS section below Review of Systems  Objective:  BP 128/84   Pulse 91   Temp (!) 97.3 F (36.3 C) (Temporal)   Ht 6\' 3"  (1.905 m)   Wt (!) 360 lb 6 oz (163.5 kg)   SpO2 95%   BMI 45.04 kg/m   Wt Readings from Last 3 Encounters:  01/08/23 (!) 360 lb 6 oz (163.5 kg)  12/16/22 (!) 355 lb (161 kg)  05/29/22 (!) 340 lb (154.2 kg)      Physical Exam Vitals and nursing note reviewed.  Constitutional:      Appearance: Normal appearance. He is obese. He is not ill-appearing.  Neurological:     Mental Status: He is alert.       Results for orders placed or performed in visit on 12/17/22  Cortisol-am, blood  Result Value Ref Range   Cortisol - AM 13.6 mcg/dL  T4, free  Result Value Ref Range   Free T4 0.87 0.60 - 1.60 ng/dL  Luteinizing hormone  Result Value Ref Range   LH 9.91 (H) 1.50 - [id number] mIU/mL  Follicle Stimulating Hormone  Result Value Ref Range   FSH 20.6 (H) 1.4 - 18.1 mIU/ML  Prolactin  Result Value Ref Range   Prolactin 9.0 2.0 - 18.0 ng/mL  IBC panel  Result Value Ref Range   Iron 91 42 - 165 ug/dL   Transferrin  186.0 (L) 212.0 - 360.0 mg/dL   Saturation Ratios 34.9 20.0 - 50.0 %   TIBC 260.4 250.0 - 450.0 mcg/dL  TSH  Result Value Ref Range   TSH 1.97 0.35 - 5.50 uIU/mL  RPR  Result Value Ref Range   RPR Ser Ql NON-REACTIVE NON-REACTIVE  Testos,Total,Free and SHBG (Male)  Result Value Ref Range   Testosterone, Total, LC-MS-MS 213 (L) 250 - 1,100 ng/dL   Free Testosterone 24.2 (L) 35.0 - 155.0 pg/mL   Sex Hormone Binding 38.8 22 - 77 nmol/L  No results found for: "HGBA1C"  Lab Results  Component Value Date   CREATININE 1.42 12/09/2022   BUN 25 (H) 12/09/2022   NA 141 12/09/2022   K 3.8 12/09/2022   CL 107 12/09/2022   CO2 26 12/09/2022  GFR = 52 Lab Results  Component Value Date   VITAMINB12 325 12/09/2022   Lab Results  Component Value Date   VD25OH 14.52 (L) 12/09/2022    Lab Results  Component Value Date   CHOL 151 12/09/2022    HDL 33.50 (L) 12/09/2022   LDLCALC 93 12/09/2022   TRIG 124.0 12/09/2022   CHOLHDL 5 12/09/2022    Assessment & Plan:   Problem List Items Addressed This Visit     Fatigue    Improvement as per above. Discussed using T replacement therapy as impetus to increase physical activity      Vitamin B12 deficiency    Low normal on latest check.  Significant improvement in energy levels since starting vit B12 replacement (515mcg daily)       Obesity, morbid, BMI 40.0-49.9 ([place])    Ongoing weight gain noted. Reassess on T therapy.  Consider GLP1RA treatment - however he's about to start medicare which will likely not cover bariatric medications.       Low testosterone in male    Start T - see below.       Memory deficit    Overall reassuring memory evaluation today including MMSE 28/30.  Reviewed supportive measures to keep healthy mind as per instructions (diet, physical activity, mental activity, social engagement).  No indication for medication.  Continue to monitor.       Vitamin D deficiency    Significant improvement in energy levels since starting vit D replacement (2000 IU daily)       Primary male hypogonadism - Primary    Testing consistent with primary hypogonadism (low T, elevated gonadotrophs). Denies testicular trauma. Will need testicular exam next visit.  Reviewed benefits of testosterone replacement such as increased energy, libido, as well as risks of treatment including but not limited to increased cardiovascular risk, blood clot, effect on prostate cancer, hepatotoxicity and need to avoid secondary exposure in household members. Reviewed need to monitor prostate, cholesterol, blood counts, liver etc. Reviewed routes of administration and preferred topical replacement given most similar to physiologic effect.  After discussion he agrees to start topical testosterone replacement - sent to pharmacy.  Return in 3 months for f/u - at Welcome to Medicare visit, check  EKG during that visit.       CKD (chronic kidney disease) stage 3, GFR 30-59 ml/min (HCC)    Acute worsening in kidney function over the past year with latest GFR 52.  Reassess at welcome to medicare visit.         Meds ordered this encounter  Medications   Testosterone 20.25 MG/ACT (1.62%) GEL    Sig: Place 2 Pump onto the skin daily.  Dispense:  75 g    Refill:  3    No orders of the defined types were placed in this encounter.   Patient Instructions  Memory testing overall doing well.  4 core lifestyle modifications to support a healthy mind:  1. Nutritious well balance diet.  2. Regular physical activity routine.  3. Regular mental activity such as reading books, word puzzles, math puzzles, jigsaw puzzles.  4. Social engagement.  Also ensure good blood pressure control, limit alcohol, no smoking.    Start testosterone cream 2 pumps daily in the morning to upper arms or as per instructions.  Return in 3 months for welcome to medicare visit, 1 week prior for fasting 8am labs.  You may [facility] at [phone] to schedule an appointment.   Follow up plan: Return in about 3 months (around 04/10/2023), or if symptoms worsen or fail to improve.  Ria Bush, MD

## 2023-01-09 ENCOUNTER — Other Ambulatory Visit (HOSPITAL_COMMUNITY): Payer: Self-pay

## 2023-01-09 ENCOUNTER — Telehealth: Payer: Self-pay

## 2023-01-09 NOTE — Telephone Encounter (Signed)
Patient Advocate Encounter  Prior authorization for Testosterone 20.25 MG/ACT(1.62%) gel submitted and APPROVED on 01/09/23. Insurance: [employer]  [place] Effective: 01/09/23 - 01/09/24

## 2023-01-09 NOTE — Telephone Encounter (Signed)
Spoke with [facility] notifying them of PA approval. Says they will get it ready for pt.

## 2023-01-09 NOTE — Telephone Encounter (Signed)
Plz submit PA for testosterone gel.

## 2023-01-10 ENCOUNTER — Encounter: Payer: Self-pay | Admitting: Family Medicine

## 2023-01-10 DIAGNOSIS — N183 Chronic kidney disease, stage 3 unspecified: Secondary | ICD-10-CM | POA: Insufficient documentation

## 2023-01-10 NOTE — Assessment & Plan Note (Signed)
Overall reassuring memory evaluation today including MMSE 28/30.  Reviewed supportive measures to keep healthy mind as per instructions (diet, physical activity, mental activity, social engagement).  No indication for medication.  Continue to monitor.

## 2023-01-10 NOTE — Assessment & Plan Note (Signed)
Significant improvement in energy levels since starting vit D replacement (2000 IU daily)

## 2023-01-10 NOTE — Assessment & Plan Note (Signed)
Ongoing weight gain noted. Reassess on T therapy.  Consider GLP1RA treatment - however he's about to start medicare which will likely not cover bariatric medications.

## 2023-01-10 NOTE — Assessment & Plan Note (Signed)
Start T - see below.

## 2023-01-10 NOTE — Assessment & Plan Note (Addendum)
Testing consistent with primary hypogonadism (low T, elevated gonadotrophs). Denies testicular trauma. Will need testicular exam next visit.  Reviewed benefits of testosterone replacement such as increased energy, libido, as well as risks of treatment including but not limited to increased cardiovascular risk, blood clot, effect on prostate cancer, hepatotoxicity and need to avoid secondary exposure in household members. Reviewed need to monitor prostate, cholesterol, blood counts, liver etc. Reviewed routes of administration and preferred topical replacement given most similar to physiologic effect.  After discussion he agrees to start topical testosterone replacement - sent to pharmacy.  Return in 3 months for f/u - at Welcome to Medicare visit, check EKG during that visit.

## 2023-01-10 NOTE — Assessment & Plan Note (Signed)
Improvement as per above. Discussed using T replacement therapy as impetus to increase physical activity

## 2023-01-10 NOTE — Assessment & Plan Note (Addendum)
Low normal on latest check.  Significant improvement in energy levels since starting vit B12 replacement (576mcg daily)

## 2023-01-10 NOTE — Assessment & Plan Note (Signed)
Acute worsening in kidney function over the past year with latest GFR 52.  Reassess at welcome to medicare visit.

## 2023-02-19 ENCOUNTER — Other Ambulatory Visit: Payer: No Typology Code available for payment source

## 2023-02-26 ENCOUNTER — Encounter: Payer: No Typology Code available for payment source | Admitting: Family Medicine

## 2023-03-19 ENCOUNTER — Ambulatory Visit: Payer: No Typology Code available for payment source | Admitting: Family Medicine

## 2023-03-31 ENCOUNTER — Telehealth: Payer: Self-pay | Admitting: Physician Assistant

## 2023-03-31 ENCOUNTER — Ambulatory Visit
Admission: RE | Admit: 2023-03-31 | Discharge: 2023-03-31 | Disposition: A | Discharge status: Home / Self Care | Payer: Medicare Other | Source: Ambulatory Visit | Attending: Physician Assistant | Admitting: Physician Assistant

## 2023-03-31 ENCOUNTER — Ambulatory Visit: Payer: Medicare Other | Admitting: Physician Assistant

## 2023-03-31 ENCOUNTER — Encounter: Payer: Self-pay | Admitting: Physician Assistant

## 2023-03-31 ENCOUNTER — Other Ambulatory Visit: Payer: Self-pay | Admitting: *Deleted

## 2023-03-31 VITALS — BP 151/95 | HR 88 | Ht 75.0 in | Wt 360.0 lb

## 2023-03-31 DIAGNOSIS — N200 Calculus of kidney: Secondary | ICD-10-CM | POA: Insufficient documentation

## 2023-03-31 DIAGNOSIS — Z87442 Personal history of urinary calculi: Secondary | ICD-10-CM | POA: Insufficient documentation

## 2023-03-31 DIAGNOSIS — R109 Unspecified abdominal pain: Secondary | ICD-10-CM

## 2023-03-31 DIAGNOSIS — N201 Calculus of ureter: Secondary | ICD-10-CM | POA: Diagnosis not present

## 2023-03-31 DIAGNOSIS — N132 Hydronephrosis with renal and ureteral calculous obstruction: Secondary | ICD-10-CM | POA: Diagnosis not present

## 2023-03-31 DIAGNOSIS — K5730 Diverticulosis of large intestine without perforation or abscess without bleeding: Secondary | ICD-10-CM | POA: Diagnosis not present

## 2023-03-31 LAB — MICROSCOPIC EXAMINATION: RBC, Urine: >30 /hpf — AB (ref 0–2)

## 2023-03-31 LAB — URINALYSIS, COMPLETE
Bilirubin, UA: NEGATIVE
Glucose, UA: NEGATIVE
Ketones, UA: NEGATIVE
Leukocytes,UA: NEGATIVE
Nitrite, UA: NEGATIVE
Specific Gravity, UA: 1.02 (ref 1.005–1.030)
Urobilinogen, Ur: 0.2 mg/dL (ref 0.2–1.0)
pH, UA: 5.5 (ref 5.0–7.5)

## 2023-03-31 MED ORDER — TAMSULOSIN HCL 0.4 MG PO CAPS
0.4000 mg | ORAL_CAPSULE | Freq: Every day | ORAL | 2 refills | Status: DC
Start: 2023-03-31 — End: 2023-04-16

## 2023-03-31 NOTE — Progress Notes (Unsigned)
03/31/2023 2:05 PM   Steve Lynch Apr 19, 1956 [id number]  CC: Chief Complaint  Patient presents with   Nephrolithiasis   HPI: Steve Lynch is a 67 y.o. male with PMH uric acid nephrolithiasis and hypogonadism on AndroGel managed by his PCP who presents today for evaluation of a possible acute stone episode.   Today he reports 1 month of intermittent gross hematuria with onset of left flank pain and nausea 2 days ago.  He denies fever, chills, and vomiting.  He has been taking OTC urinary supplements for the past 2 days to see if it would help him pass the stone, however he has not seen a stone pass.  KUB today with no radiopaque urolithiasis.  In-office UA today positive for 3+ blood and 1+ protein; urine microscopy with >30 RBCs/HPF, granular and hyaline casts, and moderate bacteria.  PMH: Past Medical History:  Diagnosis Date   Crushing injury of left thumb 09/15/2017   Drug abuse in remission (HCC) 1990s   Cocaine with rehab   Dyslipidemia    mild   Fracture of tibia with fibula, closed, right, sequela 10/16/2017   Peripheral vascular disease (HCC) 2004   varicose veins right leg   Tendonitis 10/2010   temporal tendonitis with locked jaw following tooth extraction   Vitamin B12 deficiency 09/2013    Surgical History: Past Surgical History:  Procedure Laterality Date   BACK SURGERY  2005   "DISC  REPLACEMENT"   L5-6    COLONOSCOPY  12/2011   TAx1, diverticulosis, rpt 5 yrs - no further colonoscopy done yet (Dr Myra Gianotti @ [facility])   CYSTOSCOPY W/ RETROGRADES Left 03/21/2022   Procedure: CYSTOSCOPY WITH RETROGRADE PYELOGRAM;  Surgeon: Riki Altes, MD;  Location: [facility];  Service: Urology;  Laterality: Left;   CYSTOSCOPY/URETEROSCOPY/HOLMIUM LASER/STENT PLACEMENT Left 03/21/2022   Procedure: CYSTOSCOPY/URETEROSCOPY/HOLMIUM LASER/STENT PLACEMENT;  Surgeon: Riki Altes, MD;  Location: [facility];  Service: Urology;  Laterality: Left;   KNEE ARTHROPLASTY  Left 2012   TONSILLECTOMY     TOTAL KNEE ARTHROPLASTY  11/12/2011   Procedure: TOTAL KNEE ARTHROPLASTY;  Surgeon: Shelda Pal, MD; Laterality: Right   TOTAL KNEE ARTHROPLASTY Bilateral    VARICOSE VEIN SURGERY Right    sclerotherapy    Home Medications:  Allergies as of 03/31/2023       Reactions   Oxycodone Rash        Medication List        Accurate as of March 31, 2023  2:05 PM. If you have any questions, ask your nurse or doctor.          cetirizine 10 MG tablet Commonly known as: ZYRTEC Take 1 tablet (10 mg total) by mouth daily. What changed: additional instructions   cyanocobalamin 500 MCG tablet Commonly known as: V-R VITAMIN B-12 Take 1 tablet (500 mcg total) by mouth daily.   Testosterone 20.25 MG/ACT (1.62%) Gel Place 2 Pump onto the skin daily.   Vitamin D 50 MCG (2000 UT) Caps Take 1 capsule (2,000 Units total) by mouth daily.        Allergies:  Allergies  Allergen Reactions   Oxycodone Rash    Family History: Family History  Problem Relation Age of Onset   Hyperlipidemia Mother    Hypertension Mother    Diabetes Mother    ALS Sister    Cancer Neg Hx    CAD Neg Hx    Stroke Neg Hx     Social History:  reports that he has never smoked. He has never used smokeless tobacco. He reports current alcohol use. He reports that he does not use drugs.  Physical Exam: BP (!) 151/95   Pulse 88   Ht 6\' 3"  (1.905 m)   Wt (!) 360 lb (163.3 kg)   BMI 45.00 kg/m   Constitutional:  Alert and oriented, no acute distress, nontoxic appearing HEENT: [place], AT Cardiovascular: No clubbing, cyanosis, or edema Respiratory: Normal respiratory effort, no increased work of breathing Skin: No rashes, bruises or suspicious lesions Neurologic: Grossly intact, no focal deficits, moving all 4 extremities Psychiatric: Normal mood and affect  Laboratory Data: Results for orders placed or performed in visit on 03/31/23  Microscopic Examination   Urine   Result Value Ref Range   WBC, UA 0-5 0 - 5 /hpf   RBC, Urine >30 (A) 0 - 2 /hpf   Epithelial Cells (non renal) 0-10 0 - 10 /hpf   Casts Present (A) None seen /lpf   Cast Type Granular casts (A) N/A   Bacteria, UA Moderate (A) None seen/Few  Urinalysis, Complete  Result Value Ref Range   Specific Gravity, UA 1.020 1.005 - 1.030   pH, UA 5.5 5.0 - 7.5   Color, UA Yellow Yellow   Appearance Ur Hazy (A) Clear   Leukocytes,UA Negative Negative   Protein,UA 1+ (A) Negative/Trace   Glucose, UA Negative Negative   Ketones, UA Negative Negative   RBC, UA 3+ (A) Negative   Bilirubin, UA Negative Negative   Urobilinogen, Ur 0.2 0.2 - 1.0 mg/dL   Nitrite, UA Negative Negative   Microscopic Examination See below:    Pertinent Imaging: KUB, 03/31/2023: CLINICAL DATA:  Left flank pain   EXAM: ABDOMEN - 1 VIEW   COMPARISON:  Previous studies including the examination of 11/16/2020   FINDINGS: Bowel gas pattern is nonspecific. No abnormal masses or calcifications are noted. Dextroscoliosis is seen in the lumbar spine. Marked degenerative changes are noted in the lumbar spine.   IMPRESSION: Nonspecific bowel gas pattern. No abnormal masses or calcifications are seen. Lumbar spondylosis.     Electronically Signed   By: Ernie Avena M.D.   On: 03/31/2023 16:00  I personally reviewed the images referenced above and note no radiopaque urolithiasis.  Assessment & Plan:   1. Flank pain with history of urolithiasis 2 days of left flank pain in the setting of 1 month of intermittent gross hematuria in this patient with a history of uric acid nephrolithiasis.  Unfortunately, I do not see any radiopaque stones on KUB today.  I recommended pursuing CT stone study for definitive evaluation and he agreed.  Will Grullon him with his results.  Will also send urine for culture today.  We discussed that if we do confirm a ureteral stone on CT, his treatment options will include trial of  passage versus ureteroscopy.  He expressed understanding. - Urinalysis, Complete - CULTURE, URINE COMPREHENSIVE - CT RENAL STONE STUDY; Future   Return for Will Putz with results.  Carman Ching, PA-C  [facility] [place] [address] [place], [place] [address] [phone]

## 2023-03-31 NOTE — Telephone Encounter (Signed)
CT stone study has finalized with a 4 mm distal left ureteral stone.  I spoke with the patient via telephone to discuss his results.  We discussed that there is an approximate 90% chance of spontaneous passage of this stone and he would like to pursue a trial of passage, which is reasonable.  I am sending Flomax to his pharmacy.  He declined pain medication and antiemetics.  Please schedule him for stone follow-up with UA (no KUB) with me in about 3 weeks.  We discussed return precautions including fever, uncontrolled pain, and uncontrolled nausea/vomiting.  If he develops any of these, we will need to pursue ureteroscopy for definitive stone management.

## 2023-04-03 ENCOUNTER — Encounter: Payer: Self-pay | Admitting: Family Medicine

## 2023-04-05 LAB — CULTURE, URINE COMPREHENSIVE

## 2023-04-06 ENCOUNTER — Other Ambulatory Visit: Payer: Self-pay | Admitting: Family Medicine

## 2023-04-06 DIAGNOSIS — N1831 Chronic kidney disease, stage 3a: Secondary | ICD-10-CM

## 2023-04-06 DIAGNOSIS — E291 Testicular hypofunction: Secondary | ICD-10-CM

## 2023-04-06 DIAGNOSIS — I517 Cardiomegaly: Secondary | ICD-10-CM

## 2023-04-06 DIAGNOSIS — E538 Deficiency of other specified B group vitamins: Secondary | ICD-10-CM

## 2023-04-06 DIAGNOSIS — E559 Vitamin D deficiency, unspecified: Secondary | ICD-10-CM

## 2023-04-06 DIAGNOSIS — E785 Hyperlipidemia, unspecified: Secondary | ICD-10-CM

## 2023-04-06 DIAGNOSIS — N401 Enlarged prostate with lower urinary tract symptoms: Secondary | ICD-10-CM

## 2023-04-07 ENCOUNTER — Telehealth: Payer: Self-pay | Admitting: Family Medicine

## 2023-04-07 DIAGNOSIS — I517 Cardiomegaly: Secondary | ICD-10-CM

## 2023-04-07 DIAGNOSIS — R0609 Other forms of dyspnea: Secondary | ICD-10-CM

## 2023-04-07 DIAGNOSIS — R9431 Abnormal electrocardiogram [ECG] [EKG]: Secondary | ICD-10-CM

## 2023-04-07 NOTE — Telephone Encounter (Signed)
Patient called regarding mychart message on 04/03/2023. States he had a ct scan and it showed an enlarged heart and some lung issues. Informed patient that message was sent to Dr. Reece Agar and he may not have had time to look into is yet. Advised pt I would send a message regarding this. Please advise, thank you.

## 2023-04-07 NOTE — Addendum Note (Signed)
Addended by: [name] on: 04/07/2023 01:52 PM   Modules accepted: Orders

## 2023-04-07 NOTE — Telephone Encounter (Signed)
Noted  

## 2023-04-07 NOTE — Telephone Encounter (Signed)
Noted. We can review this at appt next week. Will reply via mychart.

## 2023-04-09 ENCOUNTER — Other Ambulatory Visit: Payer: No Typology Code available for payment source

## 2023-04-11 DIAGNOSIS — G4733 Obstructive sleep apnea (adult) (pediatric): Secondary | ICD-10-CM | POA: Diagnosis not present

## 2023-04-14 ENCOUNTER — Other Ambulatory Visit (INDEPENDENT_AMBULATORY_CARE_PROVIDER_SITE_OTHER): Payer: No Typology Code available for payment source

## 2023-04-14 DIAGNOSIS — N401 Enlarged prostate with lower urinary tract symptoms: Secondary | ICD-10-CM

## 2023-04-14 DIAGNOSIS — E559 Vitamin D deficiency, unspecified: Secondary | ICD-10-CM | POA: Diagnosis not present

## 2023-04-14 DIAGNOSIS — E291 Testicular hypofunction: Secondary | ICD-10-CM | POA: Diagnosis not present

## 2023-04-14 DIAGNOSIS — E538 Deficiency of other specified B group vitamins: Secondary | ICD-10-CM | POA: Diagnosis not present

## 2023-04-14 DIAGNOSIS — R351 Nocturia: Secondary | ICD-10-CM | POA: Diagnosis not present

## 2023-04-14 DIAGNOSIS — I517 Cardiomegaly: Secondary | ICD-10-CM

## 2023-04-14 DIAGNOSIS — N1831 Chronic kidney disease, stage 3a: Secondary | ICD-10-CM

## 2023-04-14 DIAGNOSIS — E785 Hyperlipidemia, unspecified: Secondary | ICD-10-CM

## 2023-04-14 LAB — COMPREHENSIVE METABOLIC PANEL
ALT: 14 U/L (ref 0–53)
AST: 13 U/L (ref 0–37)
Albumin: 3.8 g/dL (ref 3.5–5.2)
Alkaline Phosphatase: 90 U/L (ref 39–117)
BUN: 20 mg/dL (ref 6–23)
CO2: 26 mEq/L (ref 19–32)
Calcium: 9 mg/dL (ref 8.4–10.5)
Chloride: 105 mEq/L (ref 96–112)
Creatinine, Ser: 1.02 mg/dL (ref 0.40–1.50)
GFR: 77.14 mL/min (ref >60.00)
Glucose, Bld: 102 mg/dL — ABNORMAL HIGH (ref 70–99)
Potassium: 4.4 mEq/L (ref 3.5–5.1)
Sodium: 139 mEq/L (ref 135–145)
Total Bilirubin: 0.8 mg/dL (ref 0.2–1.2)
Total Protein: 7.1 g/dL (ref 6.0–8.3)

## 2023-04-14 LAB — CBC WITH DIFFERENTIAL/PLATELET
Basophils Absolute: 0.1 10*3/uL (ref 0.0–0.1)
Basophils Relative: 1.1 % (ref 0.0–3.0)
Eosinophils Absolute: 0.4 10*3/uL (ref 0.0–0.7)
Eosinophils Relative: 4.2 % (ref 0.0–5.0)
HCT: 43 % (ref 39.0–52.0)
Hemoglobin: 14.5 g/dL (ref 13.0–17.0)
Lymphocytes Relative: 18.2 % (ref 12.0–46.0)
Lymphs Abs: 1.7 10*3/uL (ref 0.7–4.0)
MCHC: 33.7 g/dL (ref 30.0–36.0)
MCV: 88.9 fl (ref 78.0–100.0)
Monocytes Absolute: 0.6 10*3/uL (ref 0.1–1.0)
Monocytes Relative: 6.3 % (ref 3.0–12.0)
Neutro Abs: 6.6 10*3/uL (ref 1.4–7.7)
Neutrophils Relative %: 70.2 % (ref 43.0–77.0)
Platelets: 274 10*3/uL (ref 150.0–400.0)
RBC: 4.83 Mil/uL (ref 4.22–5.81)
RDW: 13.4 % (ref 11.5–15.5)
WBC: 9.4 10*3/uL (ref 4.0–10.5)

## 2023-04-14 LAB — LIPID PANEL
Cholesterol: 163 mg/dL (ref 0–200)
HDL: 34.6 mg/dL — ABNORMAL LOW (ref >39.00)
LDL Cholesterol: 108 mg/dL — ABNORMAL HIGH (ref 0–99)
NonHDL: 127.92
Total CHOL/HDL Ratio: 5
Triglycerides: 101 mg/dL (ref 0.0–149.0)
VLDL: 20.2 mg/dL (ref 0.0–40.0)

## 2023-04-14 LAB — MICROALBUMIN / CREATININE URINE RATIO
Creatinine,U: 84.3 mg/dL
Microalb Creat Ratio: 0.8 mg/g (ref 0.0–30.0)
Microalb, Ur: <0.7 mg/dL (ref 0.0–1.9)

## 2023-04-14 LAB — VITAMIN D 25 HYDROXY (VIT D DEFICIENCY, FRACTURES): VITD: 31.34 ng/mL (ref 30.00–100.00)

## 2023-04-14 LAB — PSA: PSA: 3.63 ng/mL (ref 0.10–4.00)

## 2023-04-14 LAB — VITAMIN B12: Vitamin B-12: 424 pg/mL (ref 211–911)

## 2023-04-14 LAB — BRAIN NATRIURETIC PEPTIDE: Pro B Natriuretic peptide (BNP): 50 pg/mL (ref 0.0–100.0)

## 2023-04-14 LAB — PHOSPHORUS: Phosphorus: 2.7 mg/dL (ref 2.3–4.6)

## 2023-04-14 LAB — TESTOSTERONE: Testosterone: 212.96 ng/dL — ABNORMAL LOW (ref 300.00–890.00)

## 2023-04-15 LAB — PARATHYROID HORMONE, INTACT (NO CA): PTH: 63 pg/mL (ref 16–77)

## 2023-04-16 ENCOUNTER — Encounter: Payer: Self-pay | Admitting: Family Medicine

## 2023-04-16 ENCOUNTER — Ambulatory Visit (INDEPENDENT_AMBULATORY_CARE_PROVIDER_SITE_OTHER): Payer: No Typology Code available for payment source | Admitting: Family Medicine

## 2023-04-16 VITALS — BP 126/82 | HR 105 | Temp 97.9°F | Ht 75.0 in | Wt 363.4 lb

## 2023-04-16 DIAGNOSIS — I517 Cardiomegaly: Secondary | ICD-10-CM

## 2023-04-16 DIAGNOSIS — R600 Localized edema: Secondary | ICD-10-CM | POA: Diagnosis not present

## 2023-04-16 DIAGNOSIS — E538 Deficiency of other specified B group vitamins: Secondary | ICD-10-CM | POA: Diagnosis not present

## 2023-04-16 DIAGNOSIS — M51369 Other intervertebral disc degeneration, lumbar region without mention of lumbar back pain or lower extremity pain: Secondary | ICD-10-CM

## 2023-04-16 DIAGNOSIS — R5382 Chronic fatigue, unspecified: Secondary | ICD-10-CM

## 2023-04-16 DIAGNOSIS — E785 Hyperlipidemia, unspecified: Secondary | ICD-10-CM | POA: Diagnosis not present

## 2023-04-16 DIAGNOSIS — N200 Calculus of kidney: Secondary | ICD-10-CM

## 2023-04-16 DIAGNOSIS — K8689 Other specified diseases of pancreas: Secondary | ICD-10-CM

## 2023-04-16 DIAGNOSIS — R7989 Other specified abnormal findings of blood chemistry: Secondary | ICD-10-CM

## 2023-04-16 DIAGNOSIS — Z0000 Encounter for general adult medical examination without abnormal findings: Secondary | ICD-10-CM

## 2023-04-16 DIAGNOSIS — Z7189 Other specified counseling: Secondary | ICD-10-CM

## 2023-04-16 DIAGNOSIS — N1831 Chronic kidney disease, stage 3a: Secondary | ICD-10-CM

## 2023-04-16 DIAGNOSIS — E291 Testicular hypofunction: Secondary | ICD-10-CM

## 2023-04-16 DIAGNOSIS — E559 Vitamin D deficiency, unspecified: Secondary | ICD-10-CM | POA: Diagnosis not present

## 2023-04-16 DIAGNOSIS — R0609 Other forms of dyspnea: Secondary | ICD-10-CM

## 2023-04-16 DIAGNOSIS — R9431 Abnormal electrocardiogram [ECG] [EKG]: Secondary | ICD-10-CM

## 2023-04-16 DIAGNOSIS — M5136 Other intervertebral disc degeneration, lumbar region: Secondary | ICD-10-CM

## 2023-04-16 DIAGNOSIS — Z23 Encounter for immunization: Secondary | ICD-10-CM

## 2023-04-16 DIAGNOSIS — G4733 Obstructive sleep apnea (adult) (pediatric): Secondary | ICD-10-CM | POA: Diagnosis not present

## 2023-04-16 DIAGNOSIS — Z1211 Encounter for screening for malignant neoplasm of colon: Secondary | ICD-10-CM

## 2023-04-16 DIAGNOSIS — N401 Enlarged prostate with lower urinary tract symptoms: Secondary | ICD-10-CM

## 2023-04-16 NOTE — Assessment & Plan Note (Addendum)
Regularly uses CPAP with good effect, sees pulm.

## 2023-04-16 NOTE — Assessment & Plan Note (Signed)

## 2023-04-16 NOTE — Progress Notes (Signed)
Ph: [phone] Fax: [phone]   Patient ID: Steve Lynch, male    DOB: 02-04-1956, 67 y.o.   MRN: [id number]  This visit was conducted in person.  BP 126/82   Pulse (!) 105   Temp 97.9 F (36.6 C) (Temporal)   Ht 6\' 3"  (1.905 m)   Wt (!) 363 lb 6 oz (164.8 kg)   SpO2 97%   BMI 45.42 kg/m    CC: welcome to medicare visit Subjective:   HPI: Steve Lynch is a 67 y.o. male presenting on 04/16/2023 for Welcome to Medicare Exam (Wants to discuss CT results from urology. Pt accompanied by wife, [name].)   Hearing Screening   500Hz  1000Hz  2000Hz  4000Hz   Right ear 25 40 40 0  Left ear 25 40 0 0   Vision Screening   Right eye Left eye Both eyes  Without correction     With correction 20/30 20/25 20/20     Flowsheet Row Office Visit from 04/16/2023 in [facility] [place]  PHQ-2 Total Score 1          04/16/2023    2:08 PM 01/08/2023    3:09 PM 12/16/2022    3:48 PM 10/28/2017    8:28 AM  Fall Risk   Falls in the past year? 0 0 0 Yes  Number falls in past yr:    1  Injury with Fall?    Yes  Follow up    Falls evaluation completed   Recent testosterone commencement 12/2022 - started topical testosterone 2 pumps daily. Took this for 1-2 months then stopped "got out of habit". No significant benefit noted while on it.   Seeing orthopedist for bilateral shoulder pain - this limits ability to exercise  Recent urology evaluation for gross hematuria in h/o uric acid nephrolithiasis - found to have 4mm distal L ureteral stone treated with flomax. Kidney stone pain since resolved.   CT scan also showed small linear densities in lower lung fields, enlarged heart, fatty infiltration of head of pancreas, small hiatal hernia and degenerative changes to lumbar spine with spinal stenosis and most severe neural foraminal encroachment at L3/4.  Notes chronic exertional dyspnea ongoing for months. No cough, wheezing. Takes allegra PRN - this has helped allergy  symptoms. Notes chronic R>L pedal edema, worse when on his feet  Preventative: COLONOSCOPY 12/2011 - TAx1, diverticulosis, rpt 5 yrs (Dr Lula Olszewski @ [facility]) - discussed options. Referred to GI last 3 years.  Prostate cancer screening - continues yearly PSA, has declined DRE. Denies prostate symptoms.  Lung cancer screening - not eligible  Flu shot - declines  COVID vaccine Pfizer 12/2019, 01/2020, booster 09/2020 Prevnar-20 today Tdap 02/2022 RSV - discussed  Shingrix - 02/2021, 06/2021 Advanced directive - has this at home. Wife [name] then son are HCPOA. Asked to bring [place] copy of living will.  Seat belt use discussed - doesn't use Sunscreen use discussed, no changing moles on skin.  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 Interim medical history since our last visit reviewed. Allergies and medications reviewed  and updated. Outpatient Medications Prior to Visit  Medication Sig Dispense Refill   Cholecalciferol (VITAMIN D) 50 MCG (2000 UT) CAPS Take 1 capsule (2,000 Units total) by mouth daily. 30 capsule    cyanocobalamin (V-R VITAMIN B-12) 500 MCG tablet Take 1 tablet (500 mcg total) by mouth daily.     fexofenadine (ALLEGRA) 180 MG tablet Take 180 mg by mouth daily. As needed     Testosterone 20.25 MG/ACT (1.62%) GEL Place 2 Pump onto the skin daily. 75 g 3   tamsulosin (FLOMAX) 0.4 MG CAPS capsule Take 1 capsule (0.4 mg total) by mouth daily. 30 capsule 2   No facility-administered medications prior to visit.     Per HPI unless specifically indicated in ROS section below Review of Systems  Objective:  BP 126/82   Pulse (!)  105   Temp 97.9 F (36.6 C) (Temporal)   Ht 6\' 3"  (1.905 m)   Wt (!) 363 lb 6 oz (164.8 kg)   SpO2 97%   BMI 45.42 kg/m   Wt Readings from Last 3 Encounters:  04/16/23 (!) 363 lb 6 oz (164.8 kg)  03/31/23 (!) 360 lb (163.3 kg)  01/08/23 (!) 360 lb 6 oz (163.5 kg)      Physical Exam Vitals and nursing note reviewed.  Constitutional:      General: He is not in acute distress.    Appearance: Normal appearance. He is well-developed. He is not ill-appearing.  HENT:     Head: Normocephalic and atraumatic.     Right Ear: Hearing, tympanic membrane, ear canal and external ear normal.     Left Ear: Hearing, tympanic membrane, ear canal and external ear normal.     Mouth/Throat:     Mouth: Mucous membranes are moist.     Pharynx: Oropharynx is clear. No oropharyngeal exudate or posterior oropharyngeal erythema.  Eyes:     General: No scleral icterus.    Extraocular Movements: Extraocular movements intact.     Conjunctiva/sclera: Conjunctivae normal.     Pupils: Pupils are equal, round, and reactive to light.  Neck:     Thyroid: No thyroid mass or thyromegaly.     Vascular: No carotid bruit.  Cardiovascular:     Rate and Rhythm: Normal rate and regular rhythm.     Pulses: Normal pulses.          Radial pulses are 2+ on the right side and 2+ on the left side.     Heart sounds: Normal heart sounds. No murmur heard. Pulmonary:     Effort: Pulmonary effort is normal. No respiratory distress.     Breath sounds: Normal breath sounds. No wheezing, rhonchi or rales.  Abdominal:     General: Bowel sounds are normal. There is no distension.     Palpations: Abdomen is soft. There is no mass.     Tenderness: There is no abdominal tenderness. There is no guarding or rebound.     Hernia: No hernia is present. There is no hernia in the left inguinal area or right inguinal area.  Genitourinary:    Penis: Normal.      Testes:        Right: Tenderness present. Mass, swelling, testicular  hydrocele or varicocele not present.        Left: Tenderness present. Mass, swelling, testicular hydrocele or varicocele not present.     Epididymis:     Right: Normal.     Left: Normal.     Prostate: Normal. Not enlarged (medium  size), not tender and no nodules present.     Rectum: Normal. No mass, tenderness, anal fissure, external hemorrhoid or internal hemorrhoid. Normal anal tone.     Comments: Decreased mass Musculoskeletal:        General: Normal range of motion.     Cervical back: Normal range of motion and neck supple.     Right lower leg: No edema.     Left lower leg: No edema.  Lymphadenopathy:     Cervical: No cervical adenopathy.     Lower Body: No right inguinal adenopathy. No left inguinal adenopathy.  Skin:    General: Skin is warm and dry.     Findings: No rash.  Neurological:     General: No focal deficit present.     Mental Status: He is alert and oriented to person, place, and time.     Comments:  Recall 3/3 Calculation 5/5 DLROW  Psychiatric:        Mood and Affect: Mood normal.        Behavior: Behavior normal.        Thought Content: Thought content normal.        Judgment: Judgment normal.       Results for orders placed or performed in visit on 04/14/23  Brain natriuretic peptide  Result Value Ref Range   Pro B Natriuretic peptide (BNP) 50.0 0.0 - 100.0 pg/mL  Testosterone  Result Value Ref Range   Testosterone 212.96 (L) 300.00 - 890.00 ng/dL  PSA  Result Value Ref Range   PSA 3.63 0.10 - 4.00 ng/mL  Vitamin B12  Result Value Ref Range   Vitamin B-12 424 211 - 911 pg/mL  CBC with Differential/Platelet  Result Value Ref Range   WBC 9.4 4.0 - 10.5 K/uL   RBC 4.83 4.22 - 5.81 Mil/uL   Hemoglobin 14.5 13.0 - 17.0 g/dL   HCT 16.1 09.6 - 04.5 %   MCV 88.9 78.0 - 100.0 fl   MCHC 33.7 30.0 - 36.0 g/dL   RDW 40.9 81.1 - 91.4 %   Platelets 274.0 150.0 - 400.0 K/uL   Neutrophils Relative % 70.2 43.0 - 77.0 %   Lymphocytes Relative 18.2 12.0 -  46.0 %   Monocytes Relative 6.3 3.0 - 12.0 %   Eosinophils Relative 4.2 0.0 - 5.0 %   Basophils Relative 1.1 0.0 - 3.0 %   Neutro Abs 6.6 1.4 - 7.7 K/uL   Lymphs Abs 1.7 0.7 - 4.0 K/uL   Monocytes Absolute 0.6 0.1 - 1.0 K/uL   Eosinophils Absolute 0.4 0.0 - 0.7 K/uL   Basophils Absolute 0.1 0.0 - 0.1 K/uL  Parathyroid hormone, intact (no Ca)  Result Value Ref Range   PTH 63 16 - 77 pg/mL  Microalbumin / creatinine urine ratio  Result Value Ref Range   Microalb, Ur <0.7 0.0 - 1.9 mg/dL   Creatinine,U 78.2 mg/dL   Microalb Creat Ratio 0.8 0.0 - 30.0 mg/g  VITAMIN D 25 Hydroxy (Vit-D Deficiency, Fractures)  Result Value Ref Range   VITD 31.34 30.00 - 100.00 ng/mL  Phosphorus  Result Value Ref Range   Phosphorus 2.7 2.3 - 4.6 mg/dL  Comprehensive metabolic panel  Result Value Ref Range   Sodium 139 135 - 145 mEq/L   Potassium 4.4 3.5 - 5.1 mEq/L   Chloride 105 96 - 112 mEq/L   CO2 26 19 - 32 mEq/L   Glucose, Bld 102 (H) 70 - 99 mg/dL   BUN 20 6 -  23 mg/dL   Creatinine, Ser 2.95 0.40 - 1.50 mg/dL   Total Bilirubin 0.8 0.2 - 1.2 mg/dL   Alkaline Phosphatase 90 39 - 117 U/L   AST 13 0 - 37 U/L   ALT 14 0 - 53 U/L   Total Protein 7.1 6.0 - 8.3 g/dL   Albumin 3.8 3.5 - 5.2 g/dL   GFR 62.13 >08.65 mL/min   Calcium 9.0 8.4 - 10.5 mg/dL  Lipid panel  Result Value Ref Range   Cholesterol 163 0 - 200 mg/dL   Triglycerides 784.6 0.0 - 149.0 mg/dL   HDL 96.29 (L) >52.84 mg/dL   VLDL 13.2 0.0 - 44.0 mg/dL   LDL Cholesterol 102 (H) 0 - 99 mg/dL   Total CHOL/HDL Ratio 5    NonHDL 127.92    Lab Results  Component Value Date   TSH 1.97 12/17/2022    Lab Results  Component Value Date   PSA 3.63 04/14/2023   PSA 2.44 12/09/2022   PSA 1.99 02/13/2022      04/16/2023    2:08 PM 01/08/2023    3:09 PM 12/16/2022    3:48 PM 02/19/2022   11:15 AM 02/21/2021    2:23 PM  Depression screen PHQ 2/9  Decreased Interest 1 0 1 1 1   Down, Depressed, Hopeless 0 1 1 1 1   PHQ - 2 Score 1 1 2 2  2   Altered sleeping 0 2 2 3 1   Tired, decreased energy 3 1 2 2 1   Change in appetite 2 2 3 3 1   Feeling bad or failure about yourself  1 1 1 1 1   Trouble concentrating 0 0 0 0 0  Moving slowly or fidgety/restless 0 0 0 0 0  Suicidal thoughts 0 0 0 0 0  PHQ-9 Score 7 7 10 11 6   Difficult doing work/chores Somewhat difficult Not difficult at all Not difficult at all Somewhat difficult        04/16/2023    2:08 PM 01/08/2023    3:09 PM 12/16/2022    3:48 PM 02/19/2022   11:15 AM  GAD 7 : Generalized Anxiety Score  Nervous, Anxious, on Edge 0 0 1 0  Control/stop worrying 0 1 0 0  Worry too much - different things 1 1 1 1   Trouble relaxing 0 0 0 0  Restless 0 0 0 0  Easily annoyed or irritable 1 1 1 1   Afraid - awful might happen 1 1 1  0  Total GAD 7 Score 3 4 4 2   Anxiety Difficulty Somewhat difficult Somewhat difficult Somewhat difficult Not difficult at all   EKG - NSR rate 90s, RBBB, possible q waves inferiorly, inverted T waves precordial leads, no old to compare   Assessment & Plan:   Problem List Items Addressed This Visit     Advanced directives, counseling/discussion (Chronic)    Has this at home. Asked to bring [place] a copy      Welcome to Medicare preventive visit - Primary (Chronic)    I have personally reviewed the Medicare Annual Wellness questionnaire and have noted 1. The patient's medical and social history 2. Their use of alcohol, tobacco or illicit drugs 3. Their current medications and supplements 4. The patient's functional ability including ADL's, fall risks, home safety risks and hearing or visual impairment. Cognitive function has been assessed and addressed as indicated.  5. Diet and physical activity 6. Evidence for depression or mood disorders The patients weight, height, BMI have been recorded  in the chart. I have made referrals, counseling and provided education to the patient based on review of the above and I have provided the pt with a written  personalized care plan for preventive services. Provider list updated.. See scanned questionairre as needed for further documentation. Reviewed preventative protocols and updated unless pt declined.       Relevant Orders   EKG 12-Lead (Completed)   Fatigue    No significant benefit with T replacement. See below.       Vitamin B12 deficiency    Improvement in energy and B12 blood levels on oral daily - continue this.       Dyslipidemia    Chronic, mild elevation off medication. See below.  The 10-year ASCVD risk score (Arnett DK, et al., 2019) is: 13.3%   Values used to calculate the score:     Age: 81 years     Sex: Male     Is Non-Hispanic African American: No     Diabetic: No     Tobacco smoker: No     Systolic Blood Pressure: 126 mmHg     Is BP treated: No     HDL Cholesterol: 34.6 mg/dL     Total Cholesterol: 163 mg/dL       Relevant Orders   Ambulatory referral to Cardiology   OSA on CPAP    Regularly uses CPAP with good effect, sees pulm.       Obesity, morbid, BMI 40.0-49.9 (HCC)    Continue to encourage healthy diet and lifestyle choices to affect sustainable weight loss.       Relevant Orders   Ambulatory referral to Cardiology   BPH associated with nocturia    Stable DRE, PSA trending up but within range.  Hold testosterone replacement as per above.       Leg edema, right    Chronic R>L pedal edema.  Present since trauma to RLE s/p hardware placement 2018.       Low testosterone in male    T remains low but he's been off replacement for over a month.  Will hold Testosterone replacement at this time, refer to urology for further evaluation.       Vitamin D deficiency    Continue vit D 2000 IU daily       Primary male hypogonadism    Testing consistent with primary hypogonadism (low T, elevated FSH/LH) s/p testosterone replacement 2 pumps daily x2 months without noted benefit. He is now off this.  Possible decreased testicular mass on  exam today.  Did recommend further evaluation by urology vs endocrinology - will refer.       CKD (chronic kidney disease) stage 3, GFR 30-59 ml/min (HCC)    Significant improvement with GFR back up to 77. Continue to monitor, encourage good hydration, avoid anti inflammatories as able a      Cardiomegaly    Noted on recent lung cancer screening CT study. Discussed with patient and wife. BNP normal. Will further evaluate with EKG and echocardiogram.       Relevant Orders   ECHOCARDIOGRAM COMPLETE   Ambulatory referral to Cardiology   Abnormal EKG    RBBB with possible inferior q waves and inverted t waves precordially. No old to compare. Await echocardiogram. Will offer cardiology evaluation as well given endorsed exertional dyspnea.  Cardiac risk factors include age, gender, obesity, hyperlipidemia. No h/o diabetes or hypertension.       Relevant Orders   ECHOCARDIOGRAM COMPLETE  Ambulatory referral to Cardiology   Uric acid nephrolithiasis    Appreciate urology care. Recently completed flomax course.       Fatty pancreas    Incidental finding on imaging.       DDD (degenerative disc disease), lumbar    Lumbar DDD with spinal stenosis and neural foraminal encroachment most severely at L3/4 by recent lung cancer screening CT.       Exertional dyspnea    Notes chronic exertional dyspnea present for months. Recent BNP ok. Will check baseline EKG and echocardiogram as per above.       Relevant Orders   Ambulatory referral to Cardiology   Other Visit Diagnoses     Special screening for malignant neoplasms, colon       Relevant Orders   Ambulatory referral to Gastroenterology   Need for vaccination against Streptococcus pneumoniae       Relevant Orders   Pneumococcal conjugate vaccine 20-valent (Completed)        No orders of the defined types were placed in this encounter.   Orders Placed This Encounter  Procedures   Pneumococcal conjugate vaccine 20-valent    Ambulatory referral to Gastroenterology    Referral Priority:   Routine    Referral Type:   Consultation    Referral Reason:   Specialty Services Required    Number of Visits Requested:   1   Ambulatory referral to Cardiology    Referral Priority:   Routine    Referral Type:   Consultation    Referral Reason:   Specialty Services Required    Number of Visits Requested:   1   EKG 12-Lead   ECHOCARDIOGRAM COMPLETE    Standing Status:   Future    Standing Expiration Date:   04/17/2024    Order Specific Question:   Where should this test be performed    Answer:   [facility]    Order Specific Question:   Perflutren DEFINITY (image enhancing agent) should be administered unless hypersensitivity or allergy exist    Answer:   Administer Perflutren    Order Specific Question:   Is a special reader required? (athlete or structural heart)    Answer:   No    Order Specific Question:   Does this study need to be read by the Structural team/Level 3 readers?    Answer:   No    Order Specific Question:   Reason for exam-Echo    Answer:   Cardiomegaly  I51.7    Order Specific Question:   Reason for exam-Echo    Answer:   Abnormal ECG  R94.31    Patient Instructions  EKG today  Prevar-20 today.  We will check heart ultrasound for further evaluation of enlarged heart.  Check with [facility] urology if the do testosterone management.  You may [facility] at [phone] to schedule an appointment.  Bring [place] copy of your living will.  Return in 4 months for follow up visit   Follow up plan: Return in about 4 months (around 08/17/2023), or if symptoms worsen or fail to improve, for follow up visit.  [name], MD

## 2023-04-16 NOTE — Assessment & Plan Note (Signed)
Has this at home. Asked to bring us a copy.  

## 2023-04-16 NOTE — Patient Instructions (Addendum)
EKG today  Prevar-20 today.  We will check heart ultrasound for further evaluation of enlarged heart.  Check with [facility] urology if the do testosterone management.  You may [facility] at [phone] to schedule an appointment.  Bring [place] copy of your living will.  Return in 4 months for follow up visit

## 2023-04-18 ENCOUNTER — Encounter: Payer: Self-pay | Admitting: Family Medicine

## 2023-04-18 DIAGNOSIS — K8689 Other specified diseases of pancreas: Secondary | ICD-10-CM | POA: Insufficient documentation

## 2023-04-18 DIAGNOSIS — N200 Calculus of kidney: Secondary | ICD-10-CM | POA: Insufficient documentation

## 2023-04-18 DIAGNOSIS — M5136 Other intervertebral disc degeneration, lumbar region: Secondary | ICD-10-CM | POA: Insufficient documentation

## 2023-04-18 DIAGNOSIS — R0609 Other forms of dyspnea: Secondary | ICD-10-CM | POA: Insufficient documentation

## 2023-04-18 DIAGNOSIS — I517 Cardiomegaly: Secondary | ICD-10-CM | POA: Insufficient documentation

## 2023-04-18 DIAGNOSIS — R9431 Abnormal electrocardiogram [ECG] [EKG]: Secondary | ICD-10-CM | POA: Insufficient documentation

## 2023-04-18 NOTE — Assessment & Plan Note (Addendum)
Notes chronic exertional dyspnea present for months. Recent BNP ok. Will check baseline EKG and echocardiogram as per above.

## 2023-04-18 NOTE — Assessment & Plan Note (Signed)
Testing consistent with primary hypogonadism (low T, elevated FSH/LH) s/p testosterone replacement 2 pumps daily x2 months without noted benefit. He is now off this.  Possible decreased testicular mass on exam today.  Did recommend further evaluation by urology vs endocrinology - will refer.

## 2023-04-18 NOTE — Assessment & Plan Note (Addendum)
RBBB with possible inferior q waves and inverted t waves precordially. No old to compare. Await echocardiogram. Will offer cardiology evaluation as well given endorsed exertional dyspnea.  Cardiac risk factors include age, gender, obesity, hyperlipidemia. No h/o diabetes or hypertension.

## 2023-04-18 NOTE — Assessment & Plan Note (Signed)
Appreciate urology care. Recently completed flomax course.

## 2023-04-18 NOTE — Assessment & Plan Note (Signed)
Continue vit D 2000 IU daily.  

## 2023-04-18 NOTE — Assessment & Plan Note (Addendum)
Stable DRE, PSA trending up but within range.  Hold testosterone replacement as per above.

## 2023-04-18 NOTE — Assessment & Plan Note (Signed)
Chronic R>L pedal edema.  Present since trauma to RLE s/p hardware placement 2018.

## 2023-04-18 NOTE — Telephone Encounter (Signed)
Pt called stating he spoke to someone today regarding urology referral. Pt states he didn't catch the person's name & I couldn't find any notations in pt's chart. Pt states he contacted a urology office in  & was told he couldn't be seen until 7/30. Pt states that date would be too long to wait to be seen. Pt is asking is there any other offices he could be seen at? [place] back # [phone]

## 2023-04-18 NOTE — Assessment & Plan Note (Signed)
Continue to encourage healthy diet and lifestyle choices to affect sustainable weight loss.  °

## 2023-04-18 NOTE — Assessment & Plan Note (Signed)
Lumbar DDD with spinal stenosis and neural foraminal encroachment most severely at L3/4 by recent lung cancer screening CT.

## 2023-04-18 NOTE — Assessment & Plan Note (Addendum)
Improvement in energy and B12 blood levels on oral daily - continue this.

## 2023-04-18 NOTE — Assessment & Plan Note (Signed)
Significant improvement with GFR back up to 77. Continue to monitor, encourage good hydration, avoid anti inflammatories as able a

## 2023-04-18 NOTE — Assessment & Plan Note (Signed)
Chronic, mild elevation off medication. See below.  The 10-year ASCVD risk score (Arnett DK, et al., 2019) is: 13.3%   Values used to calculate the score:     Age: 67 years     Sex: Male     Is Non-Hispanic African American: No     Diabetic: No     Tobacco smoker: No     Systolic Blood Pressure: 126 mmHg     Is BP treated: No     HDL Cholesterol: 34.6 mg/dL     Total Cholesterol: 163 mg/dL

## 2023-04-18 NOTE — Assessment & Plan Note (Signed)
Noted on recent lung cancer screening CT study. Discussed with patient and wife. BNP normal. Will further evaluate with EKG and echocardiogram.

## 2023-04-18 NOTE — Assessment & Plan Note (Signed)
No significant benefit with T replacement. See below.

## 2023-04-18 NOTE — Assessment & Plan Note (Addendum)
T remains low but he's been off replacement for over a month.  Will hold Testosterone replacement at this time, refer to urology for further evaluation.

## 2023-04-18 NOTE — Assessment & Plan Note (Signed)
Incidental finding on imaging.  

## 2023-04-18 NOTE — Telephone Encounter (Signed)
Pt called back requesting to speak to [place]. [place] back # [phone]

## 2023-04-21 ENCOUNTER — Encounter: Payer: Self-pay | Admitting: Physician Assistant

## 2023-04-21 ENCOUNTER — Other Ambulatory Visit: Payer: Self-pay

## 2023-04-21 ENCOUNTER — Telehealth: Payer: Self-pay

## 2023-04-21 ENCOUNTER — Ambulatory Visit: Payer: Medicare Other | Admitting: Physician Assistant

## 2023-04-21 VITALS — BP 138/87 | HR 88 | Ht 75.0 in | Wt 363.0 lb

## 2023-04-21 DIAGNOSIS — E291 Testicular hypofunction: Secondary | ICD-10-CM | POA: Diagnosis not present

## 2023-04-21 DIAGNOSIS — N201 Calculus of ureter: Secondary | ICD-10-CM

## 2023-04-21 DIAGNOSIS — Z1211 Encounter for screening for malignant neoplasm of colon: Secondary | ICD-10-CM

## 2023-04-21 DIAGNOSIS — Z87442 Personal history of urinary calculi: Secondary | ICD-10-CM

## 2023-04-21 MED ORDER — NA SULFATE-K SULFATE-MG SULF 17.5-3.13-1.6 GM/177ML PO SOLN: 1.0000 | Freq: Once | ORAL | 0 refills | Status: AC
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
## 2023-04-21 NOTE — Telephone Encounter (Signed)
Gastroenterology Pre-Procedure Review  Request Date: 06/06/23 Requesting Physician: Dr. Allegra Lai  PATIENT REVIEW QUESTIONS: The patient responded to the following health history questions as indicated:    1. Are you having any GI issues? no 2. Do you have a personal history of Polyps? no 3. Do you have a family history of Colon Cancer or Polyps? no 4. Diabetes Mellitus? no 5. Joint replacements in the past 12 months?no 6. Major health problems in the past 3 months?no 7. Any artificial heart valves, MVP, or defibrillator?no    MEDICATIONS & ALLERGIES:    Patient reports the following regarding taking any anticoagulation/antiplatelet therapy:   Plavix, Coumadin, Eliquis, Xarelto, Lovenox, Pradaxa, Brilinta, or Effient? no Aspirin? no  Patient confirms/reports the following medications:  Current Outpatient Medications  Medication Sig Dispense Refill   Cholecalciferol (VITAMIN D) 50 MCG (2000 UT) CAPS Take 1 capsule (2,000 Units total) by mouth daily. 30 capsule    cyanocobalamin (V-R VITAMIN B-12) 500 MCG tablet Take 1 tablet (500 mcg total) by mouth daily.     fexofenadine (ALLEGRA) 180 MG tablet Take 180 mg by mouth as needed for allergies or rhinitis. As needed     Testosterone 20.25 MG/ACT (1.62%) GEL Place 2 Pump onto the skin daily. (Patient not taking: Reported on 04/21/2023) 75 g 3   No current facility-administered medications for this visit.    Patient confirms/reports the following allergies:  Allergies  Allergen Reactions   Oxycodone Rash    No orders of the defined types were placed in this encounter.   AUTHORIZATION INFORMATION Primary Insurance: 1D#: Group #:  Secondary Insurance: 1D#: Group #:  SCHEDULE INFORMATION: Date: 06/06/23 Time: Location: [facility]

## 2023-04-22 DIAGNOSIS — M25511 Pain in right shoulder: Secondary | ICD-10-CM | POA: Diagnosis not present

## 2023-04-22 DIAGNOSIS — M25512 Pain in left shoulder: Secondary | ICD-10-CM | POA: Diagnosis not present

## 2023-04-23 NOTE — Telephone Encounter (Signed)
Called pt back on 04/18/23, see result note on EKG.

## 2023-04-28 NOTE — Telephone Encounter (Signed)
There are no recent or open referrals for Urology on this patient. It looks like the last referral in Epic is from May 2024 and it was closed out/canceled by Dr Sharen Hones.  I do not see where anyone called her regarding a Urology Referral as there is not a new one on file.

## 2023-04-29 NOTE — Addendum Note (Signed)
Addended by: [name] on: 04/29/2023 10:56 AM   Modules accepted: Orders

## 2023-04-29 NOTE — Telephone Encounter (Signed)
I have placed cardiology referral to see if pt can be seen sooner in [place].

## 2023-04-29 NOTE — Telephone Encounter (Signed)
Spoke with pt to get clarification of message. Pt states he is calling in reference to cards referral. Says he is ok for referral in [place]. I relayed Dr. Timoteo Expose message about heart [place]. Pt verbalizes understanding and can wait for that. He just thought since it was dealing with his heart it was an urgent situation.

## 2023-04-29 NOTE — Telephone Encounter (Addendum)
Can we Steve Lynch pt to clarify? He saw urology 04/21/2023. They discussed testosterone replacement. Plan was to wait for cardiac evaluation then reassess testosterone replacement therapy.  He has echocardiogram scheduled for 05/13/2023.  He has cardiology appt scheduled for 06/25/2023 with Dr Azucena Cecil.   Is he asking to see cardiologist sooner than Sept? If so I can place cardiology referral to [facility]  I don't know that we'll get sooner heart [place] appt. As long as he's feeling well, ok to wait until 7/30.

## 2023-05-01 NOTE — Progress Notes (Signed)
See 6/24 phone note.

## 2023-05-13 ENCOUNTER — Ambulatory Visit: Payer: Medicare Other | Attending: Family Medicine

## 2023-05-13 DIAGNOSIS — R9431 Abnormal electrocardiogram [ECG] [EKG]: Secondary | ICD-10-CM | POA: Diagnosis not present

## 2023-05-13 DIAGNOSIS — I517 Cardiomegaly: Secondary | ICD-10-CM

## 2023-05-13 LAB — ECHOCARDIOGRAM COMPLETE
Area-P 1/2: 3.21 cm2
S' Lateral: 5 cm

## 2023-05-13 MED ORDER — PERFLUTREN LIPID MICROSPHERE
1.0000 mL | INTRAVENOUS | Status: AC | PRN
Start: 2023-05-13 — End: 2023-05-13
  Administered 2023-05-13: 1 mL via INTRAVENOUS

## 2023-05-15 ENCOUNTER — Encounter: Payer: Self-pay | Admitting: Family Medicine

## 2023-05-18 ENCOUNTER — Encounter: Payer: Self-pay | Admitting: Family Medicine

## 2023-05-18 DIAGNOSIS — I351 Nonrheumatic aortic (valve) insufficiency: Secondary | ICD-10-CM | POA: Insufficient documentation

## 2023-05-29 ENCOUNTER — Encounter (INDEPENDENT_AMBULATORY_CARE_PROVIDER_SITE_OTHER): Payer: Self-pay

## 2023-06-02 ENCOUNTER — Encounter: Payer: Self-pay | Admitting: Cardiology

## 2023-06-02 ENCOUNTER — Ambulatory Visit: Payer: Medicare Other | Attending: Cardiology | Admitting: Cardiology

## 2023-06-02 VITALS — BP 122/92 | HR 74 | Ht 76.0 in | Wt 363.0 lb

## 2023-06-02 DIAGNOSIS — Z01810 Encounter for preprocedural cardiovascular examination: Secondary | ICD-10-CM | POA: Diagnosis not present

## 2023-06-02 DIAGNOSIS — I77810 Thoracic aortic ectasia: Secondary | ICD-10-CM

## 2023-06-02 DIAGNOSIS — I517 Cardiomegaly: Secondary | ICD-10-CM | POA: Diagnosis not present

## 2023-06-02 DIAGNOSIS — I351 Nonrheumatic aortic (valve) insufficiency: Secondary | ICD-10-CM | POA: Diagnosis not present

## 2023-06-02 NOTE — Patient Instructions (Signed)
Medication Instructions:   Your physician recommends that you continue on your current medications as directed. Please refer to the Current Medication list given to you today.  *If you need a refill on your cardiac medications before your next appointment, please [place] your pharmacy*   Lab Work:  None Ordered  If you have labs (blood work) drawn today and your tests are completely normal, you will receive your results only by: MyChart Message (if you have MyChart) OR A paper copy in the mail If you have any lab test that is abnormal or we need to change your treatment, we will [place] you to review the results.   Testing/Procedures:  Your physician has requested that you have an echocardiogram in one year. Echocardiography is a painless test that uses sound waves to create images of your heart. It provides your doctor with information about the size and shape of your heart and how well your heart's chambers and valves are working. This procedure takes approximately one hour. There are no restrictions for this procedure. Please do NOT wear cologne, perfume, aftershave, or lotions (deodorant is allowed). Please arrive 15 minutes prior to your appointment time.    Follow-Up: At [facility], you and your health needs are our priority.  As part of our continuing mission to provide you with exceptional heart care, we have created designated Provider Care Teams.  These Care Teams include your primary Cardiologist (physician) and Advanced Practice Providers (APPs -  Physician Assistants and Nurse Practitioners) who all work together to provide you with the care you need, when you need it.  We recommend signing up for the patient portal called "MyChart".  Sign up information is provided on this After Visit Summary.  MyChart is used to connect with patients for Virtual Visits (Telemedicine).  Patients are able to view lab/test results, encounter notes, upcoming appointments, etc.  Non-urgent  messages can be sent to your provider as well.   To learn more about what you can do with MyChart, go to [email or web address].    Your next appointment:   1 year(s)  Provider:   You may see Debbe Odea, MD or one of the following Advanced Practice Providers on your designated Care Team:   Nicolasa Ducking, NP Eula Listen, PA-C Cadence Fransico Michael, PA-C Charlsie Quest, NP
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
## 2023-06-18 ENCOUNTER — Encounter: Payer: Self-pay | Admitting: Family Medicine

## 2023-06-18 ENCOUNTER — Ambulatory Visit: Payer: Medicare Other | Admitting: Family Medicine

## 2023-06-18 DIAGNOSIS — I77810 Thoracic aortic ectasia: Secondary | ICD-10-CM | POA: Insufficient documentation

## 2023-06-18 DIAGNOSIS — R0609 Other forms of dyspnea: Secondary | ICD-10-CM | POA: Diagnosis not present

## 2023-06-18 DIAGNOSIS — G4733 Obstructive sleep apnea (adult) (pediatric): Secondary | ICD-10-CM | POA: Diagnosis not present

## 2023-06-18 DIAGNOSIS — I351 Nonrheumatic aortic (valve) insufficiency: Secondary | ICD-10-CM

## 2023-06-18 DIAGNOSIS — E291 Testicular hypofunction: Secondary | ICD-10-CM

## 2023-06-18 DIAGNOSIS — R5382 Chronic fatigue, unspecified: Secondary | ICD-10-CM | POA: Diagnosis not present

## 2023-06-18 DIAGNOSIS — R7989 Other specified abnormal findings of blood chemistry: Secondary | ICD-10-CM

## 2023-06-18 MED ORDER — TESTOSTERONE 20.25 MG/ACT (1.62%) TD GEL: 2.0000 | Freq: Every day | TRANSDERMAL | 3 refills | Status: DC

## 2023-06-18 NOTE — Assessment & Plan Note (Signed)
Regularly using CPAP with good effect, has upcoming pulm f/u.

## 2023-06-18 NOTE — Assessment & Plan Note (Signed)
Reassuring cardiac evaluation. Encouraged he discuss with pulm.  If normal pulm eval, anticipate deconditioning/weight contributing to this.

## 2023-06-18 NOTE — Assessment & Plan Note (Signed)
Restart topical testosterone 2 pumps daily, recheck labs in 1-2 months.

## 2023-06-18 NOTE — Assessment & Plan Note (Signed)
Ongoing. Labwork overall unrevealing. Continues CPAP use. Will retrial testosterone replacement.

## 2023-06-18 NOTE — Assessment & Plan Note (Signed)
Established with cards, monitoring yearly [place].

## 2023-06-18 NOTE — Progress Notes (Signed)
Ph: [phone] Fax: [phone]   Patient ID: Steve Lynch, male    DOB: 27-May-1956, 67 y.o.   MRN: [id number]  This visit was conducted in person.  BP 134/86   Pulse 82   Temp (!) 97.5 F (36.4 C) (Temporal)   Ht 6\' 4"  (1.93 m)   Wt (!) 362 lb 4 oz (164.3 kg)   SpO2 95%   BMI 44.09 kg/m    CC: f/u visit  Subjective:   HPI: Steve Lynch is a 67 y.o. male presenting on 06/18/2023 for Personal Problem (Reason for OV unknown. However, pt states since he's here wants to discuss a few things with Dr Reece Agar. )   COVID infection 2 weeks ago - symptoms fully resolved.  Planning to build new house.   Primary hypogonadism (low T, elevated FSH/LH, possible decreased testicular mass) - was receiving topical testosterone - this was stopped while cardiac evaluation. Plan was to f/u with urology for this. Desires to retry testosterone cream  OSA on CPAP through pulm. Upcoming appt later this month. Finds he sleeps well with this.   CT scan (done for gross hematuria in h/o kidney stones) showed enlarged heart and degenerative changes to lumbar spine with spinal stenosis with neural foraminal encroachment (L3/4).   Notes chronic R leg swelling has improved.   Chronic exertional dyspnea present for months - saw cardiology Dr Nelma Rothman last month - recommend yearly echo.  EKG showed RBBB, possible q waves inferiorly.  Echocardiogram 04/2023: EF 55-60%, normal wall motion, mod dilated LV, G1DD, normal R heart, LA mildly dilated, mild-mod aortic regurgitation with indeterminate # of cusps, mildly dilated aortic root (42mm) and ascending aorta (43mm).   Weight management: Lost 50 lbs through Keto Diet.  Contrave unaffordable 06/2020 Would be interested in weight loss surgery vs bariatric clinic.  Will check on insurance coverage for [facility].  Notes ongoing fatigue. Lab work-up reassuring (B12, TSH, vit D, CBC).   24 hour recall: Not performed  Activity: Started walking 3d[place]wk [place] 20 min with wife     Relevant past medical, surgical, family and social history reviewed and updated as indicated. Interim medical history since our last visit reviewed. Allergies and medications reviewed and updated. Outpatient Medications Prior to Visit  Medication Sig Dispense Refill   Cholecalciferol (VITAMIN D) 50 MCG (2000 UT) CAPS Take 1 capsule (2,000 Units total) by mouth daily. 30 capsule    cyanocobalamin (V-R VITAMIN B-12) 500 MCG tablet Take 1 tablet (500 mcg total) by mouth daily.     fexofenadine (ALLEGRA) 180 MG tablet Take 180 mg by mouth as needed for allergies or rhinitis. As needed     No facility-administered medications prior to visit.     Per HPI unless specifically indicated in ROS section below Review of Systems  Objective:  BP 134/86   Pulse 82   Temp (!) 97.5 F (36.4 C) (Temporal)   Ht 6\' 4"  (1.93 m)   Wt (!) 362 lb 4 oz (164.3 kg)   SpO2 95%   BMI 44.09 kg/m   Wt Readings from Last 3 Encounters:  06/18/23 (!) 362 lb 4 oz (164.3 kg)  06/02/23 (!) 363 lb (164.7 kg)  04/21/23 (!) 363 lb (164.7 kg)      Physical Exam Vitals and nursing note reviewed.  Constitutional:      Appearance: He is obese. He is not ill-appearing.  HENT:     Head: Normocephalic and atraumatic.  Mouth/Throat:     Mouth: Mucous membranes are moist.     Pharynx: Oropharynx is clear. No oropharyngeal exudate or posterior oropharyngeal erythema.  Eyes:     Extraocular Movements: Extraocular movements intact.  Cardiovascular:     Rate and Rhythm: Normal rate and regular rhythm.     Pulses: Normal pulses.     Heart sounds: Normal heart sounds. No murmur heard. Pulmonary:     Effort: Pulmonary effort is normal. No respiratory distress.     Breath sounds: Normal breath sounds. No wheezing, rhonchi or rales.  Skin:    General: Skin is warm and dry.  Neurological:     Mental Status: He is alert.  Psychiatric:        Mood and Affect: Mood normal.         Behavior: Behavior normal.       Results for orders placed or performed in visit on 05/13/23  ECHOCARDIOGRAM COMPLETE  Result Value Ref Range   S' Lateral 5.00 cm   Area-P 1/2 3.21 cm2   Est EF 55 - 60%    Lab Results  Component Value Date   VITAMINB12 424 04/14/2023   Lab Results  Component Value Date   VD25OH 31.34 04/14/2023   Assessment & Plan:   Problem List Items Addressed This Visit     Fatigue    Ongoing. Labwork overall unrevealing. Continues CPAP use. Will retrial testosterone replacement.       OSA on CPAP    Regularly using CPAP with good effect, has upcoming pulm f/u.       Obesity, morbid, BMI 40.0-49.9 (HCC) - Primary    Encouraged healthy diet and lifestyle choices to affect sustainable weight loss.  Contrave unaffordable 06/2020. I asked him to check on insurance coverage for [facility].  Discussed Noom vs weight watchers subscription programs.  Will refer to healthy weight and wellness center. He would be open to bariatric surgery if necessary.       Relevant Orders   Amb Ref to Medical Weight Management   Low testosterone in male    Restart topical testosterone 2 pumps daily, recheck labs in 1-2 months.       Primary male hypogonadism   Exertional dyspnea    Reassuring cardiac evaluation. Encouraged he discuss with pulm.  If normal pulm eval, anticipate deconditioning/weight contributing to this.       Aortic regurgitation    Established with cards, monitoring yearly [place].       Aortic root dilation (HCC)    Established with cards, monitoring yearly [place].         Meds ordered this encounter  Medications   Testosterone 20.25 MG/ACT (1.62%) GEL    Sig: Place 2 Pump onto the skin daily.    Dispense:  75 g    Refill:  3    Orders Placed This Encounter  Procedures   Amb Ref to Medical Weight Management    Referral Priority:   Routine    Referral Type:   Consultation    Number of Visits Requested:   1    Patient Instructions   We will refer you to healthy weight and wellness center  Check with your insurance on coverage for Wegovy or Zepbound weekly shots for weight loss.  Look into Noom mindset book.  Reschedule 08/2023 appt for 4 months from now.  Restart testosterone gel 2 pumps daily. Schedule lab visit in 1-2 months for am testosterone check.   Follow up plan: Return in  about 4 months (around 10/18/2023), or if symptoms worsen or fail to improve, for follow up visit.  [name], MD

## 2023-06-18 NOTE — Patient Instructions (Addendum)
We will refer you to healthy weight and wellness center  Check with your insurance on coverage for Wegovy or Zepbound weekly shots for weight loss.  Look into Noom mindset book.  Reschedule 08/2023 appt for 4 months from now.  Restart testosterone gel 2 pumps daily. Schedule lab visit in 1-2 months for am testosterone check.

## 2023-06-18 NOTE — Assessment & Plan Note (Signed)
Encouraged healthy diet and lifestyle choices to affect sustainable weight loss.  Contrave unaffordable 06/2020. I asked him to check on insurance coverage for [facility].  Discussed Noom vs weight watchers subscription programs.  Will refer to healthy weight and wellness center. He would be open to bariatric surgery if necessary.

## 2023-06-21 ENCOUNTER — Other Ambulatory Visit: Payer: Self-pay | Admitting: Physician Assistant

## 2023-06-21 DIAGNOSIS — N201 Calculus of ureter: Secondary | ICD-10-CM

## 2023-06-25 ENCOUNTER — Ambulatory Visit: Payer: Medicare Other | Admitting: Cardiology

## 2023-07-01 ENCOUNTER — Other Ambulatory Visit: Payer: Self-pay | Admitting: Family Medicine

## 2023-07-01 NOTE — Progress Notes (Signed)
Letter of medical necessity written for pt per [facility] dx request.

## 2023-07-02 ENCOUNTER — Ambulatory Visit: Payer: Medicare Other | Admitting: Nurse Practitioner

## 2023-07-08 ENCOUNTER — Ambulatory Visit: Payer: Medicare Other | Admitting: Cardiology

## 2023-07-09 ENCOUNTER — Telehealth: Payer: Self-pay | Admitting: Family Medicine

## 2023-07-09 NOTE — Telephone Encounter (Signed)
Patient is having dental work done on Wednesday October 2nd.He said that he needs amoxicillin called in for him to prevent infection due to him having steel  knees.  [id number] [place], [place] - [address] Phone: [phone]  Fax: [phone]
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
## 2023-07-10 NOTE — Telephone Encounter (Signed)
Patient returned [place], asked for a Lyerly back when possible.

## 2023-07-10 NOTE — Telephone Encounter (Signed)
Pt rtn Voisin. Pt denies any revisions to knee replacements. I relayed Dr Timoteo Expose message, pt verbalizes understanding.

## 2023-07-10 NOTE — Telephone Encounter (Signed)
Lvm asking pt to Steve Lynch back.  Need to get answer to Dr. Timoteo Expose question and relay his message.

## 2023-07-15 ENCOUNTER — Encounter
Admission: RE | Disposition: A | Discharge status: Home / Self Care | Payer: Self-pay | Source: Home / Self Care | Attending: Gastroenterology

## 2023-07-15 ENCOUNTER — Ambulatory Visit
Admission: RE | Admit: 2023-07-15 | Discharge: 2023-07-15 | Disposition: A | Discharge status: Home / Self Care | Payer: Medicare Other | Attending: Gastroenterology | Admitting: Gastroenterology

## 2023-07-15 ENCOUNTER — Ambulatory Visit: Payer: Medicare Other | Admitting: Anesthesiology

## 2023-07-15 ENCOUNTER — Other Ambulatory Visit: Payer: Self-pay

## 2023-07-15 DIAGNOSIS — I351 Nonrheumatic aortic (valve) insufficiency: Secondary | ICD-10-CM | POA: Diagnosis not present

## 2023-07-15 DIAGNOSIS — Z1211 Encounter for screening for malignant neoplasm of colon: Secondary | ICD-10-CM | POA: Insufficient documentation

## 2023-07-15 DIAGNOSIS — Z0000 Encounter for general adult medical examination without abnormal findings: Secondary | ICD-10-CM

## 2023-07-15 DIAGNOSIS — G4733 Obstructive sleep apnea (adult) (pediatric): Secondary | ICD-10-CM | POA: Insufficient documentation

## 2023-07-15 DIAGNOSIS — Z6841 Body Mass Index (BMI) 40.0 and over, adult: Secondary | ICD-10-CM | POA: Diagnosis not present

## 2023-07-15 HISTORY — PX: COLONOSCOPY WITH PROPOFOL: SHX5780

## 2023-07-15 SURGERY — COLONOSCOPY WITH PROPOFOL
Anesthesia: General

## 2023-07-15 MED ORDER — LIDOCAINE HCL (CARDIAC) PF 100 MG/5ML IV SOSY
PREFILLED_SYRINGE | INTRAVENOUS | Status: DC | PRN
  Administered 2023-07-15: 100 mg via INTRAVENOUS

## 2023-07-15 MED ORDER — PROPOFOL 500 MG/50ML IV EMUL
INTRAVENOUS | Status: DC | PRN
  Administered 2023-07-15: 75 ug/kg/min via INTRAVENOUS

## 2023-07-15 MED ORDER — PROPOFOL 10 MG/ML IV BOLUS
INTRAVENOUS | Status: DC | PRN
Start: 2023-07-15 — End: 2023-07-15
  Administered 2023-07-15: 50 mg via INTRAVENOUS

## 2023-07-15 MED ORDER — SODIUM CHLORIDE 0.9 % IV SOLN: INTRAVENOUS | Status: DC

## 2023-07-15 MED ORDER — DEXMEDETOMIDINE HCL IN NACL 200 MCG/50ML IV SOLN
INTRAVENOUS | Status: DC | PRN
Start: 2023-07-15 — End: 2023-07-15
  Administered 2023-07-15: 20 ug via INTRAVENOUS

## 2023-07-15 NOTE — Anesthesia Postprocedure Evaluation (Signed)
Anesthesia Post Note  Patient: Steve Lynch  Procedure(s) Performed: COLONOSCOPY WITH PROPOFOL  Patient location during evaluation: Endoscopy Anesthesia Type: General Level of consciousness: awake and alert Pain management: pain level controlled Vital Signs Assessment: post-procedure vital signs reviewed and stable Respiratory status: spontaneous breathing, nonlabored ventilation and respiratory function stable Cardiovascular status: blood pressure returned to baseline and stable Postop Assessment: no apparent nausea or vomiting Anesthetic complications: no   No notable events documented.   Last Vitals:  Vitals:   07/15/23 0931 07/15/23 0941  BP: 136/88 137/87  Pulse: 65 63  Resp: (!) 23 16  Temp: 36.4 C 36.4 C  SpO2: 97% 96%    Last Pain:  Vitals:   07/15/23 0941  TempSrc: Temporal  PainSc: 0-No pain                 Foye Deer
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
## 2023-07-15 NOTE — Anesthesia Preprocedure Evaluation (Addendum)
Anesthesia Evaluation  Patient identified by MRN, date of birth, ID band Patient awake    Reviewed: Allergy & Precautions, NPO status , Patient's Chart, lab work & pertinent test results  History of Anesthesia Complications Negative for: history of anesthetic complications  Airway Mallampati: III   Neck ROM: Full    Dental no notable dental hx.    Pulmonary sleep apnea and Continuous Positive Airway Pressure Ventilation    Pulmonary exam normal breath sounds clear to auscultation       Cardiovascular Normal cardiovascular exam+ Valvular Problems/Murmurs AI  Rhythm:Regular Rate:Normal  Echo 7/24 EF 55 to 60%, mild aortic root 42 mm, and ascending aorta dilatation, 43 mm, mild to moderate AI  Cardiology clearance obtained   Neuro/Psych Hx cocaine use disorder, in remission    GI/Hepatic negative GI ROS,,,  Endo/Other    Morbid obesityClass 3 obesity  Renal/GU Renal disease (nephrolithiasis)     Musculoskeletal   Abdominal  (+) + obese  Peds  Hematology negative hematology ROS (+)   Anesthesia Other Findings   Reproductive/Obstetrics                             Anesthesia Physical Anesthesia Plan  ASA: 3  Anesthesia Plan: General   Post-op Pain Management:    Induction: Intravenous  PONV Risk Score and Plan: 2 and Treatment may vary due to age or medical condition, Propofol infusion and TIVA  Airway Management Planned: Natural Airway  Additional Equipment:   Intra-op Plan:   Post-operative Plan:   Informed Consent: I have reviewed the patients History and Physical, chart, labs and discussed the procedure including the risks, benefits and alternatives for the proposed anesthesia with the patient or authorized representative who has indicated his/her understanding and acceptance.     Dental advisory given  Plan Discussed with: CRNA  Anesthesia Plan Comments:          Anesthesia Quick Evaluation

## 2023-07-15 NOTE — Transfer of Care (Signed)
Immediate Anesthesia Transfer of Care Note  Patient: Steve Lynch  Procedure(s) Performed: COLONOSCOPY WITH PROPOFOL  Patient Location: PACU  Anesthesia Type:General  Level of Consciousness: awake  Airway & Oxygen Therapy: Patient Spontanous Breathing and Patient connected to nasal cannula oxygen  Post-op Assessment: Report given to RN and Post -op Vital signs reviewed and stable  Post vital signs: Reviewed and stable  Last Vitals:  Vitals Value Taken Time  BP 129/91 07/15/23 0922  Temp 36.4 C 07/15/23 0921  Pulse 68 07/15/23 0922  Resp 18 07/15/23 0922  SpO2 93 % 07/15/23 0922  Vitals shown include unfiled device data.  Last Pain:  Vitals:   07/15/23 0921  TempSrc: Temporal  PainSc: Asleep         Complications: No notable events documented.

## 2023-07-15 NOTE — Op Note (Signed)
[facility] Gastroenterology Patient Name: Steve Lynch Procedure Date: 07/15/2023 9:04 AM MRN: [id number] Account #: [id number] Date of Birth: 07-24-1956 Admit Type: Outpatient Age: 67 Room: [facility] ENDO ROOM 2 Gender: Male Note Status: Finalized Instrument Name: Prentice Docker [id number] Procedure:             Colonoscopy Indications:           Screening for colorectal malignant neoplasm Providers:             Wyline Mood MD, MD Referring MD:          [name] (Referring MD) Medicines:             Monitored Anesthesia Care Complications:         No immediate complications. Procedure:             Pre-Anesthesia Assessment:                        - Prior to the procedure, a History and Physical was                         performed, and patient medications, allergies and                         sensitivities were reviewed. The patient's tolerance                         of previous anesthesia was reviewed.                        - The risks and benefits of the procedure and the                         sedation options and risks were discussed with the                         patient. All questions were answered and informed                         consent was obtained.                        - ASA Grade Assessment: [ASA Grade].                        - ASA Grade Assessment: II - A patient with mild                         systemic disease.                        After obtaining informed consent, the colonoscope was                         passed under direct vision. Throughout the procedure,                         the patient's blood pressure, pulse, and oxygen                         saturations were monitored continuously.  The                         Colonoscope was introduced through the anus with the                         intention of advancing to the cecum. The scope was                         advanced to the transverse colon before the procedure                          was aborted. Medications were given. The colonoscopy                         was performed with ease. The patient tolerated the                         procedure well. The quality of the bowel preparation                         was unsatisfactory. Findings:      The perianal and digital rectal examinations were normal.      Stool was found in the sigmoid colon, in the descending colon and in the       transverse colon, interfering with visualization. Impression:            - Preparation of the colon was unsatisfactory.                        - Stool in the sigmoid colon, in the descending colon                         and in the transverse colon.                        - No specimens collected. Recommendation:        - Discharge patient to home (with escort).                        - Resume previous diet.                        - Continue present medications.                        - Repeat colonoscopy in 4 weeks because the bowel                         preparation was suboptimal. Procedure Code(s):     --- Professional ---                        [phone], 53, Colonoscopy, flexible; diagnostic,                         including collection of specimen(s) by brushing or                         washing, when performed (separate procedure) Diagnosis Code(s):     ---  Professional ---                        Z12.11, Encounter for screening for malignant neoplasm                         of colon CPT copyright 2022 American Medical Association. All rights reserved. The codes documented in this report are preliminary and upon coder review may  be revised to meet current compliance requirements. Wyline Mood, MD Wyline Mood MD, MD 07/15/2023 9:19:12 AM This report has been signed electronically. Number of Addenda: 0 Note Initiated On: 07/15/2023 9:04 AM Total Procedure Duration: 0 hours 1 minute 34 seconds  Estimated Blood Loss:  Estimated blood loss: none.      [facility]

## 2023-07-16 ENCOUNTER — Encounter: Payer: Self-pay | Admitting: Gastroenterology

## 2023-07-20 ENCOUNTER — Other Ambulatory Visit: Payer: Self-pay | Admitting: Family Medicine

## 2023-07-20 DIAGNOSIS — E291 Testicular hypofunction: Secondary | ICD-10-CM

## 2023-07-24 ENCOUNTER — Other Ambulatory Visit: Payer: Self-pay | Admitting: Family Medicine

## 2023-07-24 ENCOUNTER — Other Ambulatory Visit (INDEPENDENT_AMBULATORY_CARE_PROVIDER_SITE_OTHER): Payer: Medicare Other

## 2023-07-24 DIAGNOSIS — E291 Testicular hypofunction: Secondary | ICD-10-CM

## 2023-07-24 LAB — CBC WITH DIFFERENTIAL/PLATELET
Basophils Absolute: 0.1 10*3/uL (ref 0.0–0.1)
Basophils Relative: 1.1 % (ref 0.0–3.0)
Eosinophils Absolute: 0.2 10*3/uL (ref 0.0–0.7)
Eosinophils Relative: 2.7 % (ref 0.0–5.0)
HCT: 44.4 % (ref 39.0–52.0)
Hemoglobin: 14.8 g/dL (ref 13.0–17.0)
Lymphocytes Relative: 20.8 % (ref 12.0–46.0)
Lymphs Abs: 1.9 10*3/uL (ref 0.7–4.0)
MCHC: 33.4 g/dL (ref 30.0–36.0)
MCV: 90.2 fL (ref 78.0–100.0)
Monocytes Absolute: 0.7 10*3/uL (ref 0.1–1.0)
Monocytes Relative: 7.4 % (ref 3.0–12.0)
Neutro Abs: 6.1 10*3/uL (ref 1.4–7.7)
Neutrophils Relative %: 68 % (ref 43.0–77.0)
Platelets: 308 10*3/uL (ref 150.0–400.0)
RBC: 4.92 Mil/uL (ref 4.22–5.81)
RDW: 14 % (ref 11.5–15.5)
WBC: 9 10*3/uL (ref 4.0–10.5)

## 2023-07-24 LAB — TESTOSTERONE: Testosterone: 212.47 ng/dL — ABNORMAL LOW (ref 300.00–890.00)

## 2023-07-24 MED ORDER — TESTOSTERONE 20.25 MG/ACT (1.62%) TD GEL: 3.0000 | Freq: Every day | TRANSDERMAL | 1 refills | Status: DC

## 2023-08-07 ENCOUNTER — Telehealth: Payer: Self-pay

## 2023-08-07 DIAGNOSIS — Z87442 Personal history of urinary calculi: Secondary | ICD-10-CM

## 2023-08-07 DIAGNOSIS — R310 Gross hematuria: Secondary | ICD-10-CM

## 2023-08-07 NOTE — Telephone Encounter (Signed)
Patient called hx kidney stones, has been in [place] 2 weeks helping out. He is not sure what he did and wonders if another kidney stone is passing. Noticed blood since Tuesday night 08/05/23 that has gotten worse every day and especially today, a lot of blood, dark red, small clots passing, last night urinated every 2 hours and but during the day today not as urgent. No pain anywhere, no burning, no nausea or vomiting or flnak pain.   Carollee Herter will see patient on Monday 08/11/23 3 pm with UA and KUB prior in [place] location. Patient was advised of everything. Patient was advised if symptoms get worse over the weekend to go to ER.
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
## 2023-08-11 ENCOUNTER — Ambulatory Visit: Payer: No Typology Code available for payment source | Admitting: Urology

## 2023-08-11 DIAGNOSIS — N200 Calculus of kidney: Secondary | ICD-10-CM

## 2023-08-11 DIAGNOSIS — R310 Gross hematuria: Secondary | ICD-10-CM

## 2023-08-11 DIAGNOSIS — R350 Frequency of micturition: Secondary | ICD-10-CM

## 2023-08-13 ENCOUNTER — Encounter: Payer: Self-pay | Admitting: Nurse Practitioner

## 2023-08-13 ENCOUNTER — Other Ambulatory Visit: Payer: Self-pay | Admitting: Urology

## 2023-08-13 ENCOUNTER — Ambulatory Visit: Payer: Medicare Other | Admitting: Nurse Practitioner

## 2023-08-13 VITALS — BP 140/80 | HR 87 | Ht 77.0 in | Wt 368.4 lb

## 2023-08-13 DIAGNOSIS — G4710 Hypersomnia, unspecified: Secondary | ICD-10-CM | POA: Diagnosis not present

## 2023-08-13 DIAGNOSIS — F339 Major depressive disorder, recurrent, unspecified: Secondary | ICD-10-CM

## 2023-08-13 DIAGNOSIS — R310 Gross hematuria: Secondary | ICD-10-CM

## 2023-08-13 DIAGNOSIS — G4733 Obstructive sleep apnea (adult) (pediatric): Secondary | ICD-10-CM

## 2023-08-13 NOTE — Assessment & Plan Note (Signed)
BMI 43. Follow up with medical weight management as scheduled.

## 2023-08-13 NOTE — Patient Instructions (Addendum)
Continue to use CPAP every night, minimum of 4-6 hours a night.  Change equipment as directed. Wash your tubing with warm soap and water daily, hang to dry. Wash humidifier portion weekly. Use bottled, distilled water and change daily Be aware of reduced alertness and do not drive or operate heavy machinery if experiencing this or drowsiness.  Exercise encouraged, as tolerated. Healthy weight management discussed.  Avoid or decrease alcohol consumption and medications that make you more sleepy, if possible. Notify if persistent daytime sleepiness occurs even with consistent use of PAP therapy.  We will put you in the lab and do a titration study - someone will contact you for scheduling   We discussed how untreated sleep apnea puts an individual at risk for cardiac arrhthymias, pulm HTN, DM, stroke and increases their risk for daytime accidents.  Talk to your primary care provider or who you see tomorrow about your mood Try to get enough exposure to sunlight each day as this can help   Follow up in 8-10 weeks with Dr. Wynona Neat or Philis Nettle in virtual clinic Friday PM or in person, if patient prefers. If symptoms do not improve or worsen, please contact office for sooner follow up or seek emergency care.

## 2023-08-13 NOTE — Progress Notes (Signed)
Notified patient as instructed, patient pleased. Discussed follow-up appointments, patient agrees  

## 2023-08-13 NOTE — Progress Notes (Signed)
I spoke with Mr. Steve Lynch regarding his recent episode of painless gross hematuria.  I advised him of the AUA guidelines for further evaluation of this condition consisting of a CT urogram and cystoscopy.  I explained the reasons for doing this is to rule out possible causes for the blood in the urine, such as GU malignancies, kidney stones and infection.  I explained how each 1 of these are performed and what to expect during the procedures and afterwards.  He is in agreement and would like to be scheduled for both the CT urogram and the return appointment to go over those CT scan results and have cystoscopy with Dr. Lonna Cobb.  Shanda Bumps, will you Casselman him back and schedule the CT report and cysto appointment with Dr. Lonna Cobb.

## 2023-08-13 NOTE — Assessment & Plan Note (Signed)
Multifactorial. See above. Depression is a contributing factor as well. Encouraged him to discuss with PCP. May need to consider SSRI for management. Understands to notify if symptoms worsen.

## 2023-08-13 NOTE — Assessment & Plan Note (Addendum)
Severe OSA. Lengthy discussion. Difficulties with CPAP tolerance. He has suboptimal compliance. Poorly controlled with residual events 10/h or greater some nights. Recommend he undergo CPAP titration study. May even need change to Bilevel support. Suspect this is contributing to his persistent hypersomnia. Will reassess at follow up to determine if further pharmacological therapy is appropriate. Encouraged to increase usage. Aware of proper care/use. Safe driving practices reviewed. Healthy weight loss encouraged.   Patient Instructions  Continue to use CPAP every night, minimum of 4-6 hours a night.  Change equipment as directed. Wash your tubing with warm soap and water daily, hang to dry. Wash humidifier portion weekly. Use bottled, distilled water and change daily Be aware of reduced alertness and do not drive or operate heavy machinery if experiencing this or drowsiness.  Exercise encouraged, as tolerated. Healthy weight management discussed.  Avoid or decrease alcohol consumption and medications that make you more sleepy, if possible. Notify if persistent daytime sleepiness occurs even with consistent use of PAP therapy.  We will put you in the lab and do a titration study - someone will contact you for scheduling   We discussed how untreated sleep apnea puts an individual at risk for cardiac arrhthymias, pulm HTN, DM, stroke and increases their risk for daytime accidents.  Talk to your primary care provider or who you see tomorrow about your mood Try to get enough exposure to sunlight each day as this can help   Follow up in 8-10 weeks with Dr. Wynona Neat or Philis Nettle in virtual clinic Friday PM or in person, if patient prefers. If symptoms do not improve or worsen, please contact office for sooner follow up or seek emergency care.

## 2023-08-13 NOTE — Assessment & Plan Note (Signed)
See above. No SI/HI.

## 2023-08-14 ENCOUNTER — Ambulatory Visit (INDEPENDENT_AMBULATORY_CARE_PROVIDER_SITE_OTHER): Payer: Medicare Other | Admitting: Family Medicine

## 2023-08-14 ENCOUNTER — Encounter (INDEPENDENT_AMBULATORY_CARE_PROVIDER_SITE_OTHER): Payer: Self-pay | Admitting: Family Medicine

## 2023-08-14 VITALS — BP 134/84 | HR 93 | Temp 98.1°F | Ht 77.0 in | Wt 359.0 lb

## 2023-08-14 DIAGNOSIS — R5383 Other fatigue: Secondary | ICD-10-CM | POA: Diagnosis not present

## 2023-08-14 DIAGNOSIS — Z0289 Encounter for other administrative examinations: Secondary | ICD-10-CM

## 2023-08-14 DIAGNOSIS — Z9189 Other specified personal risk factors, not elsewhere classified: Secondary | ICD-10-CM | POA: Diagnosis not present

## 2023-08-14 DIAGNOSIS — Z6841 Body Mass Index (BMI) 40.0 and over, adult: Secondary | ICD-10-CM

## 2023-08-14 DIAGNOSIS — G4733 Obstructive sleep apnea (adult) (pediatric): Secondary | ICD-10-CM | POA: Diagnosis not present
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
## 2023-08-15 ENCOUNTER — Encounter: Payer: Self-pay | Admitting: Nurse Practitioner

## 2023-08-18 ENCOUNTER — Ambulatory Visit: Payer: Medicare Other | Admitting: Family Medicine

## 2023-08-20 ENCOUNTER — Ambulatory Visit: Payer: Medicare Other | Admitting: Family Medicine

## 2023-08-20 ENCOUNTER — Ambulatory Visit
Admission: RE | Admit: 2023-08-20 | Discharge: 2023-08-20 | Disposition: A | Discharge status: Home / Self Care | Payer: Medicare Other | Source: Ambulatory Visit | Attending: Urology | Admitting: Urology

## 2023-08-20 DIAGNOSIS — N281 Cyst of kidney, acquired: Secondary | ICD-10-CM | POA: Diagnosis not present

## 2023-08-20 DIAGNOSIS — N200 Calculus of kidney: Secondary | ICD-10-CM | POA: Diagnosis not present

## 2023-08-20 DIAGNOSIS — R310 Gross hematuria: Secondary | ICD-10-CM | POA: Insufficient documentation

## 2023-08-20 DIAGNOSIS — K449 Diaphragmatic hernia without obstruction or gangrene: Secondary | ICD-10-CM | POA: Diagnosis not present

## 2023-08-20 DIAGNOSIS — K5730 Diverticulosis of large intestine without perforation or abscess without bleeding: Secondary | ICD-10-CM | POA: Diagnosis not present

## 2023-08-20 MED ORDER — SODIUM CHLORIDE 0.9 % IV BOLUS
250.0000 mL | Freq: Once | INTRAVENOUS | Status: AC
  Administered 2023-08-20: 250 mL via INTRAVENOUS

## 2023-08-20 MED ORDER — IOHEXOL 300 MG/ML  SOLN
125.0000 mL | Freq: Once | INTRAMUSCULAR | Status: AC | PRN
  Administered 2023-08-20: 125 mL via INTRAVENOUS

## 2023-08-26 ENCOUNTER — Ambulatory Visit (INDEPENDENT_AMBULATORY_CARE_PROVIDER_SITE_OTHER): Payer: Medicare Other | Admitting: Family Medicine

## 2023-08-26 ENCOUNTER — Encounter (INDEPENDENT_AMBULATORY_CARE_PROVIDER_SITE_OTHER): Payer: Self-pay | Admitting: Family Medicine

## 2023-08-26 VITALS — BP 140/80 | HR 76 | Temp 98.4°F | Ht 77.0 in | Wt 361.0 lb

## 2023-08-26 DIAGNOSIS — G4733 Obstructive sleep apnea (adult) (pediatric): Secondary | ICD-10-CM

## 2023-08-26 DIAGNOSIS — R5383 Other fatigue: Secondary | ICD-10-CM

## 2023-08-26 DIAGNOSIS — R0602 Shortness of breath: Secondary | ICD-10-CM

## 2023-08-26 DIAGNOSIS — F5089 Other specified eating disorder: Secondary | ICD-10-CM

## 2023-08-26 DIAGNOSIS — E559 Vitamin D deficiency, unspecified: Secondary | ICD-10-CM

## 2023-08-26 DIAGNOSIS — Z6841 Body Mass Index (BMI) 40.0 and over, adult: Secondary | ICD-10-CM

## 2023-08-26 DIAGNOSIS — E65 Localized adiposity: Secondary | ICD-10-CM | POA: Diagnosis not present

## 2023-08-26 DIAGNOSIS — E538 Deficiency of other specified B group vitamins: Secondary | ICD-10-CM | POA: Diagnosis not present

## 2023-08-26 DIAGNOSIS — Z1331 Encounter for screening for depression: Secondary | ICD-10-CM

## 2023-08-26 NOTE — Progress Notes (Signed)
Steve Lynch, D.O.  ABFM, ABOM Specializing in Clinical Bariatric Medicine Office located at: [address] [place], [place]  [address]   Bariatric Medicine Visit  Dear Steve Boyden, MD   Thank you for referring Steve Lynch to our clinic today for evaluation.  We performed a consultation to discuss his options for treatment and educate the patient on his disease state.  The following note includes my evaluation and treatment recommendations.   Please do not hesitate to reach out to me directly if you have any further concerns.   Assessment and Plan:   FOR THE DISEASE OF OBESITY:  Recommended Dietary Goals Steve Lynch is currently in the action stage of change. As such, his goal is to continue weight management plan.  He has agreed to: initiate Category 4 meal plan with 6 ounces of lean protein at lunch.   Behavioral Intervention We discussed the following Behavioral Modification Strategies today: begin to work on maintaining a reduced calorie state, getting the recommended amount of protein, incorporating whole foods, making healthy choices, staying well hydrated, practicing mindfulness when eating, reading food labels.   Additional resources provided today:  Handout on list of Mental Health Providers, Handout on Category 4 meal plan  Evidence-based interventions for health behavior change were utilized today including the discussion of self monitoring techniques, problem-solving barriers and SMART goal setting techniques.   Regarding patient's less desirable eating habits and patterns, we employed the technique of small changes.   Pt will specifically work on: eliminating SSBs and measuring all veggies and proteins.    Recommended Physical Activity Goals Steve Lynch has been advised to work up to 150 minutes of moderate intensity aerobic activity a week and strengthening exercises 2-3 times per week for cardiovascular health, weight loss maintenance and preservation of  muscle mass.   He has agreed to : Continue current level of physical activity    Pharmacotherapy We discussed various medication options to help Steve Lynch with his weight loss efforts and we both agreed to : n/a   FOR ASSOCIATED CONDITIONS ADDRESSED TODAY:  Fatigue Assessment & Plan: Steve Lynch does feel that his weight is causing his energy to be lower than it should be. Fatigue may be related to obesity, depression or many other causes. he does not appear to have any red flag symptoms and this appears to most likely be related to his current lifestyle habits and dietary intake.  Labs will be ordered and reviewed with him at their next office visit in two weeks.  Epworth sleepiness scale is 13 and does not appear to be within normal limits. Steve Lynch admits to daytime somnolence and admits to waking up still tired. Steve Lynch generally gets  6-8  hours of sleep per night, and states that he has generally restful sleep. Snoring is present. Pt has hx of sleep apnea and is on CPAP, which he reports is uncomfortable. Will be having repeat sleep study this Friday.   ECG: Performed and reviewed/ interpreted independently.  As compared to ECG on 06/02/23, right bundle branch block is still present with very little change from prior, Normal sinus rhythm, rate 90 bpm; reassuring without any acute abnormalities, will continue to monitor for symptoms .    Shortness of breath on exertion Assessment & Plan: Steve Lynch does feel that he gets out of breath more easily than he used to when he exercises and seems to be worsening over time with weight gain.  This has gotten worse recently. Steve Lynch denies shortness of breath  at rest or orthopnea. Steve Lynch's shortness of breath appears to be obesity related and exercise induced, as they do not appear to have any "red flag" symptoms/ concerns today.  Also, this condition appears to be related to a state of poor cardiovascular conditioning   Obtain labs today and will be reviewed with him  at their next office visit in two weeks.  Indirect Calorimeter completed today to help guide our dietary regimen. It shows a VO2 of 376 and a REE of 2592.  His calculated basal metabolic rate is 4098 thus his measured basal metabolic rate is worse than expected.  Patient agreed to work on weight loss at this time.  As Steve Lynch progresses through our weight loss program, we will gradually increase exercise as tolerated to treat his current condition.   If Steve Lynch follows our recommendations and loses 5-10% of their weight without improvement of his shortness of breath or if at any time, symptoms become more concerning, they agree to urgently follow up with their PCP/ specialist for further consideration/ evaluation.   Steve Lynch verbalizes agreement with this plan.    Depression screen [Z13.31] His Food and Mood (modified PHQ-9) score was positive at 14. Depression is commonly associated with obesity and often results in emotional eating behaviors.   We will monitor this closely and work on CBT to help improve the non-hunger eating patterns. Referral to Psychology may be required if no improvement is seen as he continues in our clinic.    Other disorder of eating - emotional eating Assessment & Plan: Pt feels that he may be depressed. Lately, he expresses not wanting to do much during the day. Just feels like his drive/motivation is gone. Pt also admits to eating when bored, stressed, sad, and to help comfort himself.   Pt provided a list of outside providers for counseling. He was informed about Dr.Barker, our bariatric psychologist. Pt prefers to hold off on referral to Dr.Barker today and desires to find a provider who can help with both general counseling and emotional eating. Additionally, pt will meet with PCP to further discuss mood and potentially initiate a mood medication. Bupropion can be a possibility if PCP feels appropriate.    Visceral obesity Assessment & Plan: Current visceral fat rating:  29. The visceral fat rating should be  < 10 in a male.    Visceral adipose tissue is a hormonally active component of total body fat. This body composition phenotype is associated with medical disorders such as metabolic syndrome, cardiovascular disease and several malignancies including prostate, breast, and colorectal cancers. Starting goal: Lose 7-10% of weight via prudent nutritional plan and lifestyle changes.   Vitamin B12 deficiency Assessment & Plan: Most recent B12 of 424 on 04/14/23. Currently on OTC Cyanocobalamin 500 mcg daily per PCP.   Continue with current supplementation regiment and begin B12 rich prudent nutritional plan.    Vitamin D deficiency Assessment & Plan: Most recent vit D of 31.34 on 04/14/23. He reports good compliance and tolerance with OTC cholecalciferol 2,000 units daily.   Begin weight loss efforts and continue current supplementation regiment.  Orders: -    VITAMIN D 25 Hydroxy (Vit-D Deficiency, Fractures) -    Vitamin B12   OSA on CPAP Assessment & Plan: Pt diagnosed with sleep apnea roughly 5-6 yrs ago. He reports that his CPAP is uncomfortable, but has been using it every night since we met on 08/14/23.   Pt has repeat sleep study this Friday. Will continue to monitor alongside PCP/specialists.  BMI 40.0-44.9, adult (HCC) Obesity, morbid, BMI 40.0-49.9 (HCC) Assessment & Plan: See obesity treatment note.   Orders: -     CBC with Differential/Platelet -     VITAMIN D 25 Hydroxy (Vit-D Deficiency, Fractures) -     Comprehensive metabolic panel -     Folate -     Hemoglobin A1c -     Insulin, random -     Lipid Panel With LDL/HDL Ratio -     T4, free -     TSH -     Vitamin B12  FOLLOW UP:   Follow up in 2 weeks. He was informed of the importance of frequent follow up visits to maximize his success with intensive lifestyle modifications for his multiple health conditions.  Steve Lynch is aware that we will review all of his lab  results at our next visit.  He is aware that if anything is critical/ life threatening with the results, we will be contacting him via MyChart prior to the office visit to discuss management.    Chief Complaint:   OBESITY Steve Lynch (MR# [id number]) is a pleasant  67 y.o. male who presents for evaluation and treatment of obesity and related comorbidities. Current BMI is Body mass index is 42.81 kg/m. Steve Lynch has been struggling with his weight for many years and has been unsuccessful in either losing weight, maintaining weight loss, or reaching his healthy weight goal.  Steve Lynch is currently in the action stage of change and ready to dedicate time achieving and maintaining a healthier weight. Steve Lynch is interested in becoming our patient and working on intensive lifestyle modifications including (but not limited to) diet and exercise for weight loss.  Steve Lynch works 16 hrs a week at [employer]. Patient is married to Steve Lynch 67 y.o  and lives with her.   Main reason for wanting to lose weight: severe fatigue, sleeping too much, and loss of mobility.   Desires to be 270 lbs as soon as possible.   Has tried keto-diet in the past; lost 50 lbs and regained weight.   Eats outside of home 3-5 days a week.   Mrs.Milburn does the shopping and most of the cooking.   Craves beef and cookies.  Snacks on cookies- typically in the evenings.   Skips one meal a day; either skips breakfast or lunch depending on the day.  Drinks sweet tea with sugar and regular soda (2-3 cans a day).   Worst food habits: cookies and sweets.   Subjective:   This is the patient's first visit at [facility].  The patient's NEW PATIENT PACKET that they filled out prior to today's office visit was reviewed at length and information from that paperwork was included within the following office visit note.    Included in the packet: current and past health history, medications, allergies,  ROS, gynecologic history (women only), surgical history, family history, social history, weight history, weight loss surgery history (for those that have had weight loss surgery), nutritional evaluation, mood and food questionnaire along with a depression screening (PHQ9) on all patients, an Epworth questionnaire, sleep habits questionnaire, patient life and health improvement goals questionnaire. These will all be scanned into the patient's chart under the "media" tab.   Review of Systems: Please refer to new patient packet scanned into media. Pertinent positives were addressed with patient today.  Reviewed by clinician on day of visit: allergies, medications, problem list,  medical history, surgical history, family history, social history, and previous encounter notes.  During the visit, I independently reviewed the patient's EKG, bioimpedance scale results, and indirect calorimeter results. I used this information to tailor a meal plan for the patient that will help Eliga R Huesca to lose weight and will improve his obesity-related conditions going forward.  I performed a medically necessary appropriate examination and/or evaluation. I discussed the assessment and treatment plan with the patient. The patient was provided an opportunity to ask questions and all were answered. The patient agreed with the plan and demonstrated an understanding of the instructions. Labs were ordered today (unless patient declined them) and will be reviewed with the patient at our next visit unless more critical results need to be addressed immediately. Clinical information was updated and documented in the EMR.   Objective:   PHYSICAL EXAM: Blood pressure (!) 140/80, pulse 76, temperature 98.4 F (36.9 C), height 6\' 5"  (1.956 m), weight (!) 361 lb (163.7 kg), SpO2 94%. Body mass index is 42.81 kg/m.  General: Well Developed, well nourished, and in no acute distress.  HEENT: Normocephalic, atraumatic; EOMI, sclerae are  anicteric. Skin: Warm and dry, good turgor Chest:  Normal excursion, shape, no gross ABN Respiratory: No conversational dyspnea; speaking in full sentences NeuroM-Sk:  Normal gross ROM * 4 extremities  Psych: A and O *3, insight adequate, mood- full   Anthropometric Measurements Height: 6\' 5"  (1.956 m) Weight: (!) 361 lb (163.7 kg) BMI (Calculated): 42.8 Weight at Last Visit: NA Weight Lost Since Last Visit: NA Weight Gained Since Last Visit: NA Starting Weight: 361lb Total Weight Loss (lbs): 0 lb (0 kg) Peak Weight: 361lb Waist Measurement : 59 inches   Body Composition  Body Fat %: 42.4 % Fat Mass (lbs): 153.4 lbs Muscle Mass (lbs): 198.2 lbs Total Body Water (lbs): 149.8 lbs Visceral Fat Rating : 29   Other Clinical Data RMR: 2592 Fasting: yes Labs: yes Today's Visit #: 1 Starting Date: 08/26/23 Comments: first visit    DIAGNOSTIC DATA REVIEWED:  BMET    Component Value Date/Time   NA 139 04/14/2023 0738   K 4.4 04/14/2023 0738   CL 105 04/14/2023 0738   CO2 26 04/14/2023 0738   GLUCOSE 102 (H) 04/14/2023 0738   BUN 20 04/14/2023 0738   CREATININE 1.02 04/14/2023 0738   CREATININE 0.94 02/13/2022 0841   CALCIUM 9.0 04/14/2023 0738   GFRNONAA 41 (L) 03/07/2022 1614   GFRAA 57 (L) 09/27/2019 0902   No results found for: "HGBA1C" No results found for: "INSULIN" Lab Results  Component Value Date   TSH 1.97 12/17/2022   CBC    Component Value Date/Time   WBC 9.0 07/24/2023 0803   RBC 4.92 07/24/2023 0803   HGB 14.8 07/24/2023 0803   HCT 44.4 07/24/2023 0803   PLT 308.0 07/24/2023 0803   MCV 90.2 07/24/2023 0803   MCH 30.4 03/07/2022 1614   MCHC 33.4 07/24/2023 0803   RDW 14.0 07/24/2023 0803   Iron Studies    Component Value Date/Time   IRON 91 12/17/2022 0902   TIBC 260.4 12/17/2022 0902   FERRITIN 154 02/14/2021 0812   IRONPCTSAT 34.9 12/17/2022 0902   IRONPCTSAT 33 02/14/2021 0812   Lipid Panel     Component Value Date/Time   CHOL  163 04/14/2023 0738   TRIG 101.0 04/14/2023 0738   HDL 34.60 (L) 04/14/2023 0738   CHOLHDL 5 04/14/2023 0738   VLDL 20.2 04/14/2023 0738   LDLCALC 108 (  H) 04/14/2023 0738   LDLCALC 96 02/13/2022 0841   Hepatic Function Panel     Component Value Date/Time   PROT 7.1 04/14/2023 0738   ALBUMIN 3.8 04/14/2023 0738   AST 13 04/14/2023 0738   ALT 14 04/14/2023 0738   ALKPHOS 90 04/14/2023 0738   BILITOT 0.8 04/14/2023 0738   BILIDIR 0.0 08/13/2016 0819      Component Value Date/Time   TSH 1.97 12/17/2022 0902   Nutritional Lab Results  Component Value Date   VD25OH 31.34 04/14/2023   VD25OH 14.52 (L) 12/09/2022   VD25OH 41 02/14/2021    Attestation Statements:   I, Special Puri, acting as a Stage manager for Thomasene Lot, DO., have compiled all relevant documentation for today's office visit on behalf of Thomasene Lot, DO, while in the presence of [employer], DO.  Reviewed by clinician on day of visit: allergies, medications, problem list, medical history, surgical history, family history, social history, and previous encounter notes pertinent to patient's obesity diagnosis.I have spent 60 minutes in the care of the patient today including: preparing to see patient (e.g. review and interpretation of tests, old notes ), obtaining and/or reviewing separately obtained history, performing a medically appropriate examination or evaluation, counseling and educating the patient, ordering medications, test or procedures, documenting clinical information in the electronic or other health care record, and independently interpreting results and communicating results to the patient, family, or caregiver   I have reviewed the above documentation for accuracy and completeness, and I agree with the above. Steve Lynch, D.O.  The 21st Century Cures Act was signed into law in 2016 which includes the topic of electronic health records.  This provides immediate access to information in  MyChart.  This includes consultation notes, operative notes, office notes, lab results and pathology reports.  If you have any questions about what you read please let us know at your next visit so we can discuss your concerns and take corrective action if need be.  We are right here with you.

## 2023-08-27 ENCOUNTER — Encounter: Payer: Self-pay | Admitting: Family Medicine

## 2023-08-27 ENCOUNTER — Ambulatory Visit: Payer: Medicare Other | Admitting: Family Medicine

## 2023-08-27 VITALS — BP 132/76 | HR 82 | Temp 97.6°F | Ht 77.0 in | Wt 368.0 lb

## 2023-08-27 DIAGNOSIS — F331 Major depressive disorder, recurrent, moderate: Secondary | ICD-10-CM

## 2023-08-27 DIAGNOSIS — E291 Testicular hypofunction: Secondary | ICD-10-CM

## 2023-08-27 DIAGNOSIS — R7989 Other specified abnormal findings of blood chemistry: Secondary | ICD-10-CM | POA: Diagnosis not present

## 2023-08-27 DIAGNOSIS — G4733 Obstructive sleep apnea (adult) (pediatric): Secondary | ICD-10-CM

## 2023-08-27 DIAGNOSIS — R5383 Other fatigue: Secondary | ICD-10-CM | POA: Diagnosis not present

## 2023-08-27 MED ORDER — BUPROPION HCL ER (SR) 100 MG PO TB12: 100.0000 mg | ORAL_TABLET | Freq: Two times a day (BID) | ORAL | 6 refills | Status: DC

## 2023-08-27 NOTE — Assessment & Plan Note (Addendum)
Appreciate [facility] care

## 2023-08-27 NOTE — Progress Notes (Signed)
Ph: [phone] Fax: [phone]   Patient ID: Steve Lynch, male    DOB: Mar 04, 1956, 67 y.o.   MRN: [id number]  This visit was conducted in person.  BP 132/76   Pulse 82   Temp 97.6 F (36.4 C) (Oral)   Ht 6\' 5"  (1.956 m)   Wt (!) 368 lb (166.9 kg)   SpO2 98%   BMI 43.64 kg/m    CC: discuss concerns  Subjective:   HPI: Steve Lynch is a 67 y.o. male presenting on 08/27/2023 for Medical Management of Chronic Issues (Several concerns about health ) and Depression (When seen at [facility] recommended evaluation by pcp  )   He enjoys listening to [employer].  He went to [place] [employer] for workers. Gross hematuria with clots during this trip. Spoke with urology Dr Lonna Cobb with CT scan pending cystoscopy next month. Hasn't noted any further blood.  Not on blood thinners.   [facility] with [facility], last seen yesterday and note reviewed. Had reassuring labs done including CBC, CMP, folate, A1c, random insulin, TSH, fT4, B12, D. Found to have slow metabolism. Was referred to [facility] psychologist. Was recommended dietary modification.   Concern for worsening depression - he describes anhedonia with fatigue, low energy, increased sleep, and increased appetite. Bariatric clinic recommended treatment for this, bupropion suggested. H/o SI remotely, nothing recently. No current SI/HI. He stays very involved in his church. No h/o seizures.   Primary hypogonadism (low T, elevated FSH/LH, possible decreased testicular mass) - was receiving topical testosterone - this was stopped while cardiac evaluation. Plan was to f/u with urology for this. 06/2023 we restarted testosterone cream and increased dose to 3 pumps daily due to persistently low T levels (212). He has not recently been using.  Lab Results  Component Value Date   PSA 3.63 04/14/2023   PSA 2.44 12/09/2022   PSA 1.99 02/13/2022    Severe OSA with hypersomnia, trouble  tolerating CPAP, never noted significant benefit with this. This is followed by pulm. Pending CPAP titration study, consideration for bilevel support. He is now using CPAP nightly.   Chronic exertional dyspnea present for months - saw cardiology Dr Nelma Rothman last month - recommend yearly echo.  EKG showed RBBB, possible q waves inferiorly.  Echocardiogram 04/2023: EF 55-60%, normal wall motion, mod dilated LV, G1DD, normal R heart, LA mildly dilated, mild-mod aortic regurgitation with indeterminate # of cusps, mildly dilated aortic root (42mm) and ascending aorta (43mm).      Relevant past medical, surgical, family and social history reviewed and updated as indicated. Interim medical history since our last visit reviewed. Allergies and medications reviewed and updated. Outpatient Medications Prior to Visit  Medication Sig Dispense Refill   Cholecalciferol (VITAMIN D) 50 MCG (2000 UT) CAPS Take 1 capsule (2,000 Units total) by mouth daily. 30 capsule    cyanocobalamin (V-R VITAMIN B-12) 500 MCG tablet Take 1 tablet (500 mcg total) by mouth daily.     Testosterone 20.25 MG/ACT (1.62%) GEL Place 3 Pump onto the skin daily. 150 g 1   No facility-administered medications prior to visit.     Per HPI unless specifically indicated in ROS section below Review of Systems  Objective:  BP 132/76   Pulse 82   Temp 97.6 F (36.4 C) (Oral)   Ht 6\' 5"  (1.956 m)   Wt (!) 368 lb (166.9 kg)   SpO2 98%   BMI  43.64 kg/m   Wt Readings from Last 3 Encounters:  08/27/23 (!) 368 lb (166.9 kg)  08/26/23 (!) 361 lb (163.7 kg)  08/14/23 (!) 359 lb (162.8 kg)      Physical Exam Vitals and nursing note reviewed.  Constitutional:      Appearance: Normal appearance. He is not ill-appearing.  HENT:     Mouth/Throat:     Mouth: Mucous membranes are moist.     Pharynx: Oropharynx is clear. No oropharyngeal exudate or posterior oropharyngeal erythema.  Eyes:     Extraocular Movements: Extraocular  movements intact.     Pupils: Pupils are equal, round, and reactive to light.  Cardiovascular:     Rate and Rhythm: Normal rate and regular rhythm.     Pulses: Normal pulses.     Heart sounds: Normal heart sounds. No murmur heard. Pulmonary:     Effort: Pulmonary effort is normal. No respiratory distress.     Breath sounds: Normal breath sounds. No wheezing, rhonchi or rales.  Skin:    General: Skin is warm and dry.     Findings: No rash.  Psychiatric:        Mood and Affect: Mood normal.        Behavior: Behavior normal.       Results for orders placed or performed in visit on 08/26/23  CBC with Differential/Platelet  Result Value Ref Range   WBC 8.2 3.4 - 10.8 x10E3/uL   RBC 5.19 4.14 - 5.80 x10E6/uL   Hemoglobin 15.7 13.0 - 17.7 g/dL   Hematocrit 16.1 09.6 - 51.0 %   MCV 91 79 - 97 fL   MCH 30.3 26.6 - 33.0 pg   MCHC 33.3 31.5 - 35.7 g/dL   RDW 04.5 40.9 - 81.1 %   Platelets 274 150 - 450 x10E3/uL   Neutrophils 70 Not Estab. %   Lymphs 18 Not Estab. %   Monocytes 7 Not Estab. %   Eos 3 Not Estab. %   Basos 1 Not Estab. %   Neutrophils Absolute 5.8 1.4 - 7.0 x10E3/uL   Lymphocytes Absolute 1.5 0.7 - 3.1 x10E3/uL   Monocytes Absolute 0.6 0.1 - 0.9 x10E3/uL   EOS (ABSOLUTE) 0.2 0.0 - 0.4 x10E3/uL   Basophils Absolute 0.1 0.0 - 0.2 x10E3/uL   Immature Granulocytes 1 Not Estab. %   Immature Grans (Abs) 0.1 0.0 - 0.1 x10E3/uL  VITAMIN D 25 Hydroxy (Vit-D Deficiency, Fractures)  Result Value Ref Range   Vit D, 25-Hydroxy 41.2 30.0 - 100.0 ng/mL  Comprehensive metabolic panel  Result Value Ref Range   Glucose 79 70 - 99 mg/dL   BUN 18 8 - 27 mg/dL   Creatinine, Ser 9.14 0.76 - 1.27 mg/dL   eGFR 82 >78 GN/FAO/1.30   BUN/Creatinine Ratio 18 10 - 24   Sodium WILL FOLLOW    Potassium WILL FOLLOW    Chloride WILL FOLLOW    CO2 22 20 - 29 mmol/L   Calcium 9.4 8.6 - 10.2 mg/dL   Total Protein 7.6 6.0 - 8.5 g/dL   Albumin 4.2 3.9 - 4.9 g/dL   Globulin, Total 3.4 1.5 -  4.5 g/dL   Bilirubin Total 0.6 0.0 - 1.2 mg/dL   Alkaline Phosphatase 111 44 - 121 IU/L   AST 21 0 - 40 IU/L   ALT 21 0 - 44 IU/L  Folate  Result Value Ref Range   Folate 10.4 >3.0 ng/mL  Hemoglobin A1c  Result Value Ref Range   Hgb  A1c MFr Bld 5.5 4.8 - 5.6 %   Est. average glucose Bld gHb Est-mCnc 111 mg/dL  Insulin, random  Result Value Ref Range   INSULIN 11.9 2.6 - 24.9 uIU/mL  Lipid Panel With LDL/HDL Ratio  Result Value Ref Range   Cholesterol, Total 195 100 - 199 mg/dL   Triglycerides 97 0 - 149 mg/dL   HDL 39 (L) >16 mg/dL   VLDL Cholesterol Cal 18 5 - 40 mg/dL   LDL Chol Calc (NIH) 109 (H) 0 - 99 mg/dL   LDL/HDL Ratio 3.5 0.0 - 3.6 ratio  T4, free  Result Value Ref Range   Free T4 1.09 0.82 - 1.77 ng/dL  TSH  Result Value Ref Range   TSH 3.630 0.450 - 4.500 uIU/mL  Vitamin B12  Result Value Ref Range   Vitamin B-12 957 232 - 1,245 pg/mL      08/27/2023    7:58 AM 04/16/2023    2:08 PM 01/08/2023    3:09 PM 12/16/2022    3:48 PM 02/19/2022   11:15 AM  Depression screen PHQ 2/9  Decreased Interest 1 1 0 1 1  Down, Depressed, Hopeless 2 0 1 1 1   PHQ - 2 Score 3 1 1 2 2   Altered sleeping 2 0 2 2 3   Tired, decreased energy 2 3 1 2 2   Change in appetite 2 2 2 3 3   Feeling bad or failure about yourself  1 1 1 1 1   Trouble concentrating 1 0 0 0 0  Moving slowly or fidgety/restless 0 0 0 0 0  Suicidal thoughts 0 0 0 0 0  PHQ-9 Score 11 7 7 10 11   Difficult doing work/chores Not difficult at all Somewhat difficult Not difficult at all Not difficult at all Somewhat difficult       08/27/2023    7:59 AM 04/16/2023    2:08 PM 01/08/2023    3:09 PM 12/16/2022    3:48 PM  GAD 7 : Generalized Anxiety Score  Nervous, Anxious, on Edge 1 0 0 1  Control/stop worrying 1 0 1 0  Worry too much - different things 1 1 1 1   Trouble relaxing 0 0 0 0  Restless 0 0 0 0  Easily annoyed or irritable 1 1 1 1   Afraid - awful might happen 1 1 1 1   Total GAD 7 Score 5 3 4 4   Anxiety  Difficulty Not difficult at all Somewhat difficult Somewhat difficult Somewhat difficult   Assessment & Plan:   Problem List Items Addressed This Visit     Fatigue    Ongoing. Restart testosterone replacement. Start wellbutrin as per above.       OSA on CPAP    Continued fatigue despite using CPAP.  Appreciate pulm care - pending CPAP titration study.       Obesity, morbid, BMI 40.0-49.9 (HCC)    Appreciate healthy weight and wellness clinic care      MDD (major depressive disorder), recurrent episode, moderate (HCC)    Discussed options. Reviewed mechanism of action of activating bupropion  Start Wellbutrin SR 100mg  daily. Update with effect in 4-6 wks.   He will also [place] to schedule counseling appt.       Relevant Medications   buPROPion ER (WELLBUTRIN SR) 100 MG 12 hr tablet   Low testosterone in male - Primary    Restart topical testosterone 3 pumps daily - has not been taking I did ask him to discuss with urology  about other treatment options.       Primary male hypogonadism     Meds ordered this encounter  Medications   buPROPion ER (WELLBUTRIN SR) 100 MG 12 hr tablet    Sig: Take 1 tablet (100 mg total) by mouth 2 (two) times daily.    Dispense:  30 tablet    Refill:  6    No orders of the defined types were placed in this encounter.   Patient Instructions  Touch base with counselor  Start wellbutrin SR 100mg  daily.  Let us know how you do with this.   Follow up plan: Return if symptoms worsen or fail to improve.  [name], MD

## 2023-08-27 NOTE — Assessment & Plan Note (Addendum)
Continued fatigue despite using CPAP.  Appreciate pulm care - pending CPAP titration study.

## 2023-08-27 NOTE — Assessment & Plan Note (Signed)
Ongoing. Restart testosterone replacement. Start wellbutrin as per above.

## 2023-08-27 NOTE — Assessment & Plan Note (Addendum)
Discussed options. Reviewed mechanism of action of activating bupropion  Start Wellbutrin SR 100mg  daily. Update with effect in 4-6 wks.   He will also [place] to schedule counseling appt.

## 2023-08-27 NOTE — Patient Instructions (Addendum)
Touch base with counselor  Start wellbutrin SR 100mg  daily.  Let us know how you do with this.

## 2023-08-27 NOTE — Assessment & Plan Note (Signed)
Restart topical testosterone 3 pumps daily - has not been taking I did ask him to discuss with urology about other treatment options.

## 2023-08-29 ENCOUNTER — Ambulatory Visit (HOSPITAL_BASED_OUTPATIENT_CLINIC_OR_DEPARTMENT_OTHER): Payer: Medicare Other | Attending: Nurse Practitioner | Admitting: Pulmonary Disease

## 2023-08-29 DIAGNOSIS — G4733 Obstructive sleep apnea (adult) (pediatric): Secondary | ICD-10-CM | POA: Insufficient documentation

## 2023-08-29 LAB — HEMOGLOBIN A1C
Est. average glucose Bld gHb Est-mCnc: 111 mg/dL
Hgb A1c MFr Bld: 5.5 % (ref 4.8–5.6)

## 2023-08-29 LAB — COMPREHENSIVE METABOLIC PANEL
ALT: 21 [IU]/L (ref 0–44)
AST: 21 [IU]/L (ref 0–40)
Albumin: 4.2 g/dL (ref 3.9–4.9)
Alkaline Phosphatase: 111 [IU]/L (ref 44–121)
BUN/Creatinine Ratio: 18 (ref 10–24)
BUN: 18 mg/dL (ref 8–27)
Bilirubin Total: 0.6 mg/dL (ref 0.0–1.2)
CO2: 22 mmol/L (ref 20–29)
Calcium: 9.4 mg/dL (ref 8.6–10.2)
Chloride: 105 mmol/L (ref 96–106)
Creatinine, Ser: 1.02 mg/dL (ref 0.76–1.27)
Globulin, Total: 3.4 g/dL (ref 1.5–4.5)
Glucose: 79 mg/dL (ref 70–99)
Potassium: 4.6 mmol/L (ref 3.5–5.2)
Sodium: 143 mmol/L (ref 134–144)
Total Protein: 7.6 g/dL (ref 6.0–8.5)
eGFR: 82 mL/min/{1.73_m2} (ref >59)

## 2023-08-29 LAB — LIPID PANEL WITH LDL/HDL RATIO
Cholesterol, Total: 195 mg/dL (ref 100–199)
HDL: 39 mg/dL — ABNORMAL LOW (ref >39)
LDL Chol Calc (NIH): 138 mg/dL — ABNORMAL HIGH (ref 0–99)
LDL/HDL Ratio: 3.5 ratio (ref 0.0–3.6)
Triglycerides: 97 mg/dL (ref 0–149)
VLDL Cholesterol Cal: 18 mg/dL (ref 5–40)

## 2023-08-29 LAB — TSH: TSH: 3.63 u[IU]/mL (ref 0.450–4.500)

## 2023-08-29 LAB — CBC WITH DIFFERENTIAL/PLATELET
Basophils Absolute: 0.1 10*3/uL (ref 0.0–0.2)
Basos: 1 %
EOS (ABSOLUTE): 0.2 10*3/uL (ref 0.0–0.4)
Eos: 3 %
Hematocrit: 47.2 % (ref 37.5–51.0)
Hemoglobin: 15.7 g/dL (ref 13.0–17.7)
Immature Grans (Abs): 0.1 10*3/uL (ref 0.0–0.1)
Immature Granulocytes: 1 %
Lymphocytes Absolute: 1.5 10*3/uL (ref 0.7–3.1)
Lymphs: 18 %
MCH: 30.3 pg (ref 26.6–33.0)
MCHC: 33.3 g/dL (ref 31.5–35.7)
MCV: 91 fL (ref 79–97)
Monocytes Absolute: 0.6 10*3/uL (ref 0.1–0.9)
Monocytes: 7 %
Neutrophils Absolute: 5.8 10*3/uL (ref 1.4–7.0)
Neutrophils: 70 %
Platelets: 274 10*3/uL (ref 150–450)
RBC: 5.19 x10E6/uL (ref 4.14–5.80)
RDW: 12.7 % (ref 11.6–15.4)
WBC: 8.2 10*3/uL (ref 3.4–10.8)

## 2023-08-29 LAB — VITAMIN B12: Vitamin B-12: 957 pg/mL (ref 232–1245)

## 2023-08-29 LAB — T4, FREE: Free T4: 1.09 ng/dL (ref 0.82–1.77)

## 2023-08-29 LAB — FOLATE: Folate: 10.4 ng/mL (ref >3.0)

## 2023-08-29 LAB — VITAMIN D 25 HYDROXY (VIT D DEFICIENCY, FRACTURES): Vit D, 25-Hydroxy: 41.2 ng/mL (ref 30.0–100.0)

## 2023-08-29 LAB — INSULIN, RANDOM: INSULIN: 11.9 u[IU]/mL (ref 2.6–24.9)

## 2023-09-02 DIAGNOSIS — M25511 Pain in right shoulder: Secondary | ICD-10-CM | POA: Diagnosis not present

## 2023-09-02 DIAGNOSIS — G4733 Obstructive sleep apnea (adult) (pediatric): Secondary | ICD-10-CM | POA: Diagnosis not present

## 2023-09-02 DIAGNOSIS — M25512 Pain in left shoulder: Secondary | ICD-10-CM | POA: Diagnosis not present

## 2023-09-02 NOTE — Procedures (Signed)
Patient Name: Steve Lynch, Steve Lynch Date: 08/29/2023 Gender: Male D.O.B: 07/02/1956 Age (years): 20 Referring Provider: Noemi Chapel NP Height (inches): 75 Interpreting Physician: Cyril Mourning MD, ABSM Weight (lbs): 368 RPSGT: Cherylann Parr BMI: 46 MRN: [id number] Neck Size: 20.00 <br> <br> CLINICAL INFORMATION The patient is referred for a PAP titration to treat sleep apnea.    2018 HST: AHI 63/h  SLEEP STUDY TECHNIQUE As per the AASM Manual for the Scoring of Sleep and Associated Events v2.3 (April 2016) with a hypopnea requiring 4% desaturations.  The channels recorded and monitored were frontal, central and occipital EEG, electrooculogram (EOG), submentalis EMG (chin), nasal and oral airflow, thoracic and abdominal wall motion, anterior tibialis EMG, snore microphone, electrocardiogram, and pulse oximetry. Bilevel positive airway pressure (BPAP) was initiated at the beginning of the study and titrated to treat sleep-disordered breathing.  MEDICATIONS Medications self-administered by patient taken the night of the study : N/A  RESPIRATORY PARAMETERS Optimal IPAP Pressure (cm): 15 AHI at Optimal Pressure (/hr) 0 Optimal EPAP Pressure (cm): 11   Overall Minimal O2 (%): 76.0 Minimal O2 at Optimal Pressure (%): 87.0 SLEEP ARCHITECTURE Start Time: 10:55:48 PM Stop Time: 4:59:33 AM Total Time (min): 363.7 Total Sleep Time (min): 266.5 Sleep Latency (min): 15.1 Sleep Efficiency (%): 73.3% REM Latency (min): 101.0 WASO (min): 82.1 Stage N1 (%): 3.4% Stage N2 (%): 82.2% Stage N3 (%): 0.0% Stage R (%): 14.5 Supine (%): 0.00 Arousal Index (/hr): 1.4     CARDIAC DATA The 2 lead EKG demonstrated sinus rhythm. The mean heart rate was 67.5 beats per minute. Other EKG findings include: None.   LEG MOVEMENT DATA The total Periodic Limb Movements of Sleep (PLMS) were 0. The PLMS index was 0.0. A PLMS index of <15 is considered normal in adults.  IMPRESSIONS - An optimal PAP pressure  was selected for this patient ( 15 / 11 cm of water). CPAP was titrated to 11 cm & events persisted hence switched to bilevel - Central sleep apnea was not noted during this titration (CAI = 1.6/h). - Severe oxygen desaturations were observed during this titration (min O2 = 76.0%). - No snoring was audible during this study. - No cardiac abnormalities were observed during this study. - Clinically significant periodic limb movements were not noted during this study. Arousals associated with PLMs were rare.   DIAGNOSIS - Obstructive Sleep Apnea (G47.33)   RECOMMENDATIONS - Trial of BiPAP therapy on 15/11 cm H2O with a Large size Fisher&Paykel Full Face Vitera mask and heated humidification. - Avoid alcohol, sedatives and other CNS depressants that may worsen sleep apnea and disrupt normal sleep architecture. - Sleep hygiene should be reviewed to assess factors that may improve sleep quality. - Weight management and regular exercise should be initiated or continued. - Return to [facility] for re-evaluation after 4 weeks of therapy  [Electronically signed] 09/02/2023 08:01 AM  Cyril Mourning MD, ABSM Diplomate, American Board of Sleep Medicine NPI: [id number]

## 2023-09-05 ENCOUNTER — Other Ambulatory Visit: Payer: Self-pay | Admitting: Family Medicine

## 2023-09-08 ENCOUNTER — Other Ambulatory Visit: Payer: Self-pay | Admitting: Nurse Practitioner

## 2023-09-08 DIAGNOSIS — G4733 Obstructive sleep apnea (adult) (pediatric): Secondary | ICD-10-CM

## 2023-09-08 NOTE — Addendum Note (Signed)
Addended by: Noemi Chapel on: 09/08/2023 09:45 AM   Modules accepted: Orders

## 2023-09-09 ENCOUNTER — Telehealth: Payer: Self-pay

## 2023-09-09 DIAGNOSIS — Z1211 Encounter for screening for malignant neoplasm of colon: Secondary | ICD-10-CM

## 2023-09-09 NOTE — Telephone Encounter (Signed)
Patient had colonoscopy on 07/15/2023 and Dr. Tobi Bastos said to repeat colonoscopy in 4 weeks because bowel preparation was Suboptimal. 2 day prep

## 2023-09-10 ENCOUNTER — Encounter (INDEPENDENT_AMBULATORY_CARE_PROVIDER_SITE_OTHER): Payer: Self-pay

## 2023-09-10 ENCOUNTER — Telehealth: Payer: Self-pay

## 2023-09-10 ENCOUNTER — Ambulatory Visit (INDEPENDENT_AMBULATORY_CARE_PROVIDER_SITE_OTHER): Payer: Medicare Other | Admitting: Family Medicine

## 2023-09-10 ENCOUNTER — Other Ambulatory Visit: Payer: Self-pay

## 2023-09-10 DIAGNOSIS — Z1211 Encounter for screening for malignant neoplasm of colon: Secondary | ICD-10-CM

## 2023-09-10 MED ORDER — NA SULFATE-K SULFATE-MG SULF 17.5-3.13-1.6 GM/177ML PO SOLN: 1.0000 | Freq: Once | ORAL | 0 refills | Status: AC

## 2023-09-10 NOTE — Addendum Note (Signed)
Addended by: Avie Arenas on: 09/10/2023 11:56 AM   Modules accepted: Orders

## 2023-09-10 NOTE — Telephone Encounter (Signed)
The patient called back to talk to [place].

## 2023-09-10 NOTE — Progress Notes (Incomplete)
[name], D.O.  ABFM, ABOM Clinical Bariatric Medicine Physician  Office located at: [address] [place], [place]  [address]     Assessment and Plan:   FOR THE DISEASE OF OBESITY:  There are no diagnoses linked to this encounter.  Since last office visit on 08/26/2023 patient's  Muscle mass has {DID:29233} by ***lb. Fat mass has {DID:29233} by ***lb. Total body water has {DID:29233} by ***lb.  Counseling done on how various foods will affect these numbers and how to maximize success  Total lbs lost to date: *** Total weight loss percentage to date: ***   Recommended Dietary Goals Gamaliel is currently in the action stage of change. As such, his goal is to continue weight management plan. He has agreed to: {EMWTLOSSPLAN:29297::"continue current plan"}   Behavioral Intervention We discussed the following Behavioral Modification Strategies today: {dowtlossstrategies:31654}  Additional resources provided today: {DOhandouts:31655::"None"}  Evidence-based interventions for health behavior change were utilized today including the discussion of self monitoring techniques, problem-solving barriers and SMART goal setting techniques.   Regarding patient's less desirable eating habits and patterns, we employed the technique of small changes.   Pt will specifically work on: *** for next visit.   Recommended Physical Activity Goals Lexus has been advised to work up to 150 minutes of moderate intensity aerobic activity a week and strengthening exercises 2-3 times per week for cardiovascular health, weight loss maintenance and preservation of muscle mass.   He has agreed to :  {EMEXERCISE:28847::"Think about enjoyable ways to increase daily physical activity and overcoming barriers to exercise","Increase physical activity in their day and reduce sedentary time (increase NEAT)."}   Pharmacotherapy We discussed various medication options to help Nahim with his weight loss  efforts and we both agreed to : {EMagreedrx:29170::"continue with nutritional and behavioral strategies"}   FOR ASSOCIATED CONDITIONS ADDRESSED TODAY:  There are no diagnoses linked to this encounter.     FOLLOW UP:   No follow-ups on file. He was informed of the importance of frequent follow up visits to maximize his success with intensive lifestyle modifications for his multiple health conditions.   Subjective:   Chief complaint: Obesity Abdulsamad is here to discuss his progress with his obesity treatment plan. He is on the the Category 4 Plan with 6 ounces of lean protein at lunch and states he is following his eating plan approximately *** % of the time. He states he is exercising *** minutes *** days per week.  Interval History:  Matix Sadberry Viall is here today for his first follow-up office visit since starting the program with [place].  Since last office visit he ***  All blood work/ lab tests that were recently ordered by myself or an outside provider were reviewed with patient today per their request. Extended time was spent counseling him on all new disease processes that were discovered or preexisting ones that are affected by BMI.  he understands that many of these abnormalities will need to monitored regularly along with the current treatment plan of prudent dietary changes, in which we are making each and every office visit, to improve these health parameters.  Pharmacotherapy for weight loss: He {srtis (Optional):29129} currently taking {srtpreviousweightlossmeds (Optional):29124} for medical weight loss.  Denies side effects.    Review of Systems:  Pertinent positives were addressed with patient today.  Reviewed by clinician on day of visit: allergies, medications, problem list, medical history, surgical history, family history, social history, and previous encounter notes.   Weight Summary and  Biometrics   No data recorded No data recorded  No data recorded No data  recorded No data recorded No data recorded   Objective:   PHYSICAL EXAM:  There were no vitals taken for this visit. There is no height or weight on file to calculate BMI.  General: he is overweight, cooperative and in no acute distress.   HEENT: EOMI, sclerae are anicteric. Lungs: Normal breathing effort, no conversational dyspnea. M-Sk:  Normal gross ROM * 4 extremities  PSYCH: Has normal mood, affect and thought process. Neurologic: No gross sensory or motor deficits. Well developed, A and O * 3  DIAGNOSTIC DATA REVIEWED:  BMET    Component Value Date/Time   NA 143 08/26/2023 1244   K 4.6 08/26/2023 1244   CL 105 08/26/2023 1244   CO2 22 08/26/2023 1244   GLUCOSE 79 08/26/2023 1244   GLUCOSE 102 (H) 04/14/2023 0738   BUN 18 08/26/2023 1244   CREATININE 1.02 08/26/2023 1244   CREATININE 0.94 02/13/2022 0841   CALCIUM 9.4 08/26/2023 1244   GFRNONAA 41 (L) 03/07/2022 1614   GFRAA 57 (L) 09/27/2019 0902   Lab Results  Component Value Date   HGBA1C 5.5 08/26/2023   Lab Results  Component Value Date   INSULIN 11.9 08/26/2023   Lab Results  Component Value Date   TSH 3.630 08/26/2023   CBC    Component Value Date/Time   WBC 8.2 08/26/2023 1244   WBC 9.0 07/24/2023 0803   RBC 5.19 08/26/2023 1244   RBC 4.92 07/24/2023 0803   HGB 15.7 08/26/2023 1244   HCT 47.2 08/26/2023 1244   PLT 274 08/26/2023 1244   MCV 91 08/26/2023 1244   MCH 30.3 08/26/2023 1244   MCH 30.4 03/07/2022 1614   MCHC 33.3 08/26/2023 1244   MCHC 33.4 07/24/2023 0803   RDW 12.7 08/26/2023 1244   Iron Studies    Component Value Date/Time   IRON 91 12/17/2022 0902   TIBC 260.4 12/17/2022 0902   FERRITIN 154 02/14/2021 0812   IRONPCTSAT 34.9 12/17/2022 0902   IRONPCTSAT 33 02/14/2021 0812   Lipid Panel     Component Value Date/Time   CHOL 195 08/26/2023 1244   TRIG 97 08/26/2023 1244   HDL 39 (L) 08/26/2023 1244   CHOLHDL 5 04/14/2023 0738   VLDL 20.2 04/14/2023 0738    LDLCALC 138 (H) 08/26/2023 1244   LDLCALC 96 02/13/2022 0841   Hepatic Function Panel     Component Value Date/Time   PROT 7.6 08/26/2023 1244   ALBUMIN 4.2 08/26/2023 1244   AST 21 08/26/2023 1244   ALT 21 08/26/2023 1244   ALKPHOS 111 08/26/2023 1244   BILITOT 0.6 08/26/2023 1244   BILIDIR 0.0 08/13/2016 0819      Component Value Date/Time   TSH 3.630 08/26/2023 1244   Nutritional Lab Results  Component Value Date   VD25OH 41.2 08/26/2023   VD25OH 31.34 04/14/2023   VD25OH 14.52 (L) 12/09/2022    Attestations:   Reviewed by clinician on day of visit: allergies, medications, problem list, medical history, surgical history, family history, social history, and previous encounter notes pertinent to patient's obesity diagnosis.   I have spent 50 *** minutes in the care of the patient today including: preparing to see patient (e.g. review and interpretation of tests, old notes ), obtaining and/or reviewing separately obtained history, performing a medically appropriate examination or evaluation, counseling and educating the patient, ordering medications, test or procedures, documenting clinical information in the electronic  or other health care record, and independently interpreting results and communicating results to the patient, family, or caregiver   I, ***, acting as a medical scribe for Thomasene Lot, DO., have compiled all relevant documentation for today's office visit on behalf of Thomasene Lot, DO, while in the presence of [employer], DO.  I have reviewed the above documentation for accuracy and completeness, and I agree with the above. [name], D.O.  The 21st Century Cures Act was signed into law in 2016 which includes the topic of electronic health records.  This provides immediate access to information in MyChart.  This includes consultation notes, operative notes, office notes, lab results and pathology reports.  If you have any questions about what  you read please let us know at your next visit so we can discuss your concerns and take corrective action if need be.  We are right here with you.

## 2023-09-10 NOTE — Telephone Encounter (Signed)
Per [facility]  pt insurance  benefits will change on Oct 15, 2023. Per [facility] can not approve Jan procedure until Jan. 1 ,2025

## 2023-09-10 NOTE — Telephone Encounter (Signed)
Repeat colonoscopy scheduled 11/03/23 with Dr. Tobi Bastos at [facility] due to suboptimal prep. Cardiac clearance noted 06/02/2023 for previous colonoscopy.  Thanks,  [place], [place]

## 2023-09-15 DIAGNOSIS — L218 Other seborrheic dermatitis: Secondary | ICD-10-CM | POA: Diagnosis not present

## 2023-09-18 ENCOUNTER — Encounter: Payer: Self-pay | Admitting: Urology

## 2023-09-18 ENCOUNTER — Ambulatory Visit: Payer: Medicare Other | Admitting: Urology

## 2023-09-18 VITALS — BP 148/70 | HR 82 | Ht 75.0 in | Wt 368.0 lb

## 2023-09-18 DIAGNOSIS — R310 Gross hematuria: Secondary | ICD-10-CM | POA: Diagnosis not present

## 2023-09-18 DIAGNOSIS — N200 Calculus of kidney: Secondary | ICD-10-CM

## 2023-09-18 DIAGNOSIS — R3129 Other microscopic hematuria: Secondary | ICD-10-CM | POA: Diagnosis not present

## 2023-09-18 LAB — URINALYSIS, COMPLETE
Bilirubin, UA: NEGATIVE
Glucose, UA: NEGATIVE
Ketones, UA: NEGATIVE
Leukocytes,UA: NEGATIVE
Nitrite, UA: NEGATIVE
RBC, UA: NEGATIVE
Specific Gravity, UA: >1.03 — ABNORMAL HIGH (ref 1.005–1.030)
Urobilinogen, Ur: 0.2 mg/dL (ref 0.2–1.0)
pH, UA: 5 (ref 5.0–7.5)

## 2023-09-18 LAB — MICROSCOPIC EXAMINATION

## 2023-09-18 NOTE — Progress Notes (Signed)
   09/18/23  CC:  Chief Complaint  Patient presents with   Cysto    HPI: See Carollee Herter [name]'s note 08/13/2023.  Episode total gross painless hematuria late October 2024 and CTU/cystoscopy recommended.  CTU performed 08/20/2023 showed Bosniak 1/2 renal cysts; 2 punctate nonobstructing left upper pole renal calculi.  Blood pressure (!) 148/70, pulse 82, height 6\' 3"  (1.905 m), weight (!) 368 lb (166.9 kg). NED. A&Ox3.   No respiratory distress   Abd soft, NT, ND Normal phallus with bilateral descended testicles  Cystoscopy Procedure Note  Patient identification was confirmed, informed consent was obtained, and patient was prepped using Betadine solution.  Lidocaine jelly was administered per urethral meatus.     Pre-Procedure: - Inspection reveals a normal caliber ureteral meatus.  Procedure: The flexible cystoscope was introduced without difficulty - No urethral strictures/lesions are present. -Prominent lateral lobe enlargement prostate with hypervascularity - Elevated bladder neck - Bilateral ureteral orifices identified - Bladder mucosa  reveals no ulcers, tumors, or lesions - No bladder stones - Moderate trabeculation  Retroflexion shows intravesical median lobe with hypervascularity and prominent hypervascularity bladder neck   Post-Procedure: - Patient tolerated the procedure well  Assessment/ Plan: Gross hematuria most likely secondary to BPH Urine cytology sent and will Linam with results Small, nonobstructing left renal calculi Bosniak 1/2 renal cysts   Riki Altes, MD

## 2023-09-23 ENCOUNTER — Ambulatory Visit (INDEPENDENT_AMBULATORY_CARE_PROVIDER_SITE_OTHER): Payer: Medicare Other | Admitting: Family Medicine

## 2023-09-23 ENCOUNTER — Encounter (INDEPENDENT_AMBULATORY_CARE_PROVIDER_SITE_OTHER): Payer: Self-pay | Admitting: Family Medicine

## 2023-09-23 VITALS — BP 136/87 | HR 79 | Temp 98.4°F | Ht 75.0 in | Wt 349.0 lb

## 2023-09-23 DIAGNOSIS — G4733 Obstructive sleep apnea (adult) (pediatric): Secondary | ICD-10-CM | POA: Diagnosis not present

## 2023-09-23 DIAGNOSIS — F5089 Other specified eating disorder: Secondary | ICD-10-CM

## 2023-09-23 DIAGNOSIS — E88819 Insulin resistance, unspecified: Secondary | ICD-10-CM | POA: Diagnosis not present

## 2023-09-23 DIAGNOSIS — E559 Vitamin D deficiency, unspecified: Secondary | ICD-10-CM

## 2023-09-23 DIAGNOSIS — E782 Mixed hyperlipidemia: Secondary | ICD-10-CM

## 2023-09-23 DIAGNOSIS — E538 Deficiency of other specified B group vitamins: Secondary | ICD-10-CM | POA: Diagnosis not present

## 2023-09-23 DIAGNOSIS — Z6841 Body Mass Index (BMI) 40.0 and over, adult: Secondary | ICD-10-CM

## 2023-09-23 NOTE — Patient Instructions (Signed)
The 10-year ASCVD risk score (Arnett DK, et al., 2019) is: 15.9%   Values used to calculate the score:     Age: 67 years     Sex: Male     Is Non-Hispanic African American: No     Diabetic: No     Tobacco smoker: No     Systolic Blood Pressure: 136 mmHg     Is BP treated: No     HDL Cholesterol: 39 mg/dL     Total Cholesterol: 195 mg/dL

## 2023-09-23 NOTE — Progress Notes (Incomplete)
[name], D.O.  ABFM, ABOM Clinical Bariatric Medicine Physician  Office located at: [address] [place], [place]  [address]   Assessment and Plan:   FOR THE DISEASE OF OBESITY: BMI 40.0-44.9, adult (HCC) - current BMI 46 Obesity, morbid, BMI 40.0-49.9 (HCC) Assessment & Plan: Since last office visit on 08/26/23 patient's  muscle mass has decreased by 3.2 lb. Fat mass has decreased by 9 lb. Total body water has increased by 0.2 lb.  Counseling done on how various foods will affect these numbers and how to maximize success  Total lbs lost to date: 12 lbs  Total weight loss percentage to date: 3.32%    Recommended Dietary Goals Judd is currently in the action stage of change. As such, his goal is to continue weight management plan.  He has agreed to: keep a food journal with a target of 1850-2050 calories and 130+ grams of protein per day. He states that his wife will be journaling for him.    Behavioral Intervention We discussed the following  today: getting the recommended amount of protein, incorporating whole foods, low-glycemic index fruits, the ideal Healthy Eating Plate with the pt: 1/2 plate of lean protein and other half of plate is complex carbs, fruits & veggies,   Additional resources provided today:  Handout on Healthy Eating Principles & Handout on Journaling log  Evidence-based interventions for health behavior change were utilized today including the discussion of self monitoring techniques, problem-solving barriers and SMART goal setting techniques.   Regarding patient's less desirable eating habits and patterns, we employed the technique of small changes.   Pt will specifically work on: journaling intake/bringing in food log.   Recommended Physical Activity Goals Tom has been advised to work up to 150 minutes of moderate intensity aerobic activity a week and strengthening exercises 2-3 times per week for cardiovascular health, weight loss  maintenance and preservation of muscle mass.   He has agreed to : Continue current level of physical activity    Pharmacotherapy We both agreed to : continue current anti-obesity medication regimen   FOR ASSOCIATED CONDITIONS ADDRESSED TODAY:  Insulin resistance - new onset Assessment & Plan: Lab Results  Component Value Date   HGBA1C 5.5 08/26/2023   INSULIN 11.9 08/26/2023   Lab Results  Component Value Date   WBC 8.2 08/26/2023   HGB 15.7 08/26/2023   HCT 47.2 08/26/2023   MCV 91 08/26/2023   PLT 274 08/26/2023   Lab Results  Component Value Date   CREATININE 1.02 08/26/2023   BUN 18 08/26/2023   NA 143 08/26/2023   K 4.6 08/26/2023   CL 105 08/26/2023   CO2 22 08/26/2023      Component Value Date/Time   PROT 7.6 08/26/2023 1244   ALBUMIN 4.2 08/26/2023 1244   AST 21 08/26/2023 1244   ALT 21 08/26/2023 1244   ALKPHOS 111 08/26/2023 1244   BILITOT 0.6 08/26/2023 1244   BILIDIR 0.0 08/13/2016 0819   Lab Results  Component Value Date   TSH 3.630 08/26/2023   FREET4 1.09 08/26/2023    Fasting insulin levels are roughly 2 times normal. A1c is not consistent with the prediabetic range. Blood counts are within recommended limits and not indicative of anemia. Kidney function, electrolytes, and liver enzymes are within normal limits. No concerns with thyroid levels.   I recommended the patient to drink approximately half of their body weight in ounces of water daily. Additionally, have an extra bottle of water  for every 30 minutes of exercise. I counseled patient on pathophysiology of the disease process of I.R. Handout on I.R provided today. Explained role of simple carbs and insulin levels on hunger and cravings. Continue with weight loss therapy.    Mixed hyperlipidemia Assessment & Plan: Lab Results  Component Value Date   CHOL 195 08/26/2023   HDL 39 (L) 08/26/2023   LDLCALC 138 (H) 08/26/2023   TRIG 97 08/26/2023   CHOLHDL 5 04/14/2023   The 10-year  ASCVD risk score (Arnett DK, et al., 2019) is: 15.9%   Values used to calculate the score:     Age: 43 years     Sex: Male     Is Non-Hispanic African American: No     Diabetic: No     Tobacco smoker: No     Systolic Blood Pressure: 136 mmHg     Is BP treated: No     HDL Cholesterol: 39 mg/dL     Total Cholesterol: 195 mg/dL  HDL has improved from 34.60 to 39. Eventual goal >60. LDL is elevated at 138.  Due to his elevated ASCVD risk score, new onset of I.R, significant visceral obesity, and other cardio-metabolic dysregulation conditions, I recommended pt discuss with PCP at next OV about initiating statin therapy, and or further evaluation of his cardiovascular risk (e.g coronary calcium score or other) I stressed the importance that patient continue with our prudent nutritional plan that is low in saturated and trans fats to improve these numbers. We recommend: aerobic activity with eventual goal of a minimum of 300-450+ min wk plus 2 days/ week of resistance or strength training.     Other disorder of eating - emotional eating Assessment & Plan: Pt started on Wellbutrin per PCP around 08/27/23. No complaints of emotional eating since LOV. He will continue with Wellbutrin at current dose. Reminded patient of the importance of following their prudent nutrition plan and how food can affect mood as well to support emotional wellbeing.    Vitamin B12 deficiency Assessment & Plan: Lab Results  Component Value Date   VITAMINB12 957 08/26/2023   FOLATE 10.4 08/26/2023    Condition treated with OTC Vitamin B12 500 mcg daily. No concerns with folate and B12 levels. Continue with supplementation regiment and continue to focus on b12 rich foods.   Vitamin D deficiency Assessment & Plan:  Lab Results  Component Value Date   VD25OH 41.2 08/26/2023   VD25OH 31.34 04/14/2023   VD25OH 14.52 (L) 12/09/2022   Pt not sure if he is taking OTC vitamin D 1,000 or 2,000 units once daily.  His  Vitamin D levels are not quite at goal of 50 to 70. I discussed the importance of vitamin D to the patient's health and well-being as well as to their ability to lose weight. Pt instructed to take his vitamin D twice daily.    OSA treated with BiPAP Assessment & Plan: Pt recently had a sleep study. He ordered a BiPAP, which will arrive to his home soon. Continue with weight loss efforts.    FOLLOW UP:   Return 10/22/2023. He was informed of the importance of frequent follow up visits to maximize his success with intensive lifestyle modifications for his multiple health conditions.  Subjective:   Chief complaint: Obesity Davine is here to discuss his progress with his obesity treatment plan. He is following a "low fat, low sugar, low carb" meal plan approximately 100 % of the time. He states he is not exercising.  Interval History:  Remigio Karaman Outman is here today for his first follow-up office visit since starting the program with [place].  Since last office visit he is down 12 lbs. Pt followed the Category 4 MP until 09/08/23 and then switched to a "low fat, low sugar, low carb meal plan". He made this switch because the Category 4 plan "was too much food". He has typically been having greek yogurt or two boiled eggs for breakfast. For lunch, he has a wrap with a slice of cheese and 5-6 slices of deli meat. For dinner, he has some lean protein - does not measure portion. Based on food recall, he appears to be under in calories. He is planning to start Silver Sneakers next week.   All blood work/ lab tests that were recently ordered by myself or an outside provider were reviewed with patient today per their request. Extended time was spent counseling him on all new disease processes that were discovered or preexisting ones that are affected by BMI.  he understands that many of these abnormalities will need to monitored regularly along with the current treatment plan of prudent dietary changes, in which we  are making each and every office visit, to improve these health parameters.  Pharmacotherapy for weight loss: He is currently taking  BuPropion ER 100 mg bid  for medical weight loss.  Denies side effects.    Review of Systems:  Pertinent positives were addressed with patient today.  Reviewed by clinician on day of visit: allergies, medications, problem list, medical history, surgical history, family history, social history, and previous encounter notes.  Weight Summary and Biometrics   Weight Lost Since Last Visit: 12lb  Weight Gained Since Last Visit: 0lb   Vitals Temp: 98.4 F (36.9 C) BP: 136/87 Pulse Rate: 79 SpO2: 95 %   Anthropometric Measurements Height: 6\' 3"  (1.905 m) Weight: (!) 349 lb (158.3 kg) BMI (Calculated): 43.62 Weight at Last Visit: 361lb Weight Lost Since Last Visit: 12lb Weight Gained Since Last Visit: 0lb Starting Weight: 361lb Total Weight Loss (lbs): 12 lb (5.443 kg) Peak Weight: 361lb   Body Composition  Body Fat %: 41.3 % Fat Mass (lbs): 144.4 lbs Muscle Mass (lbs): 195 lbs Total Body Water (lbs): 150 lbs Visceral Fat Rating : 27   Other Clinical Data Fasting: no Labs: no Today's Visit #: 2 Starting Date: 08/26/23   Objective:   PHYSICAL EXAM:  Blood pressure 136/87, pulse 79, temperature 98.4 F (36.9 C), height 6\' 3"  (1.905 m), weight (!) 349 lb (158.3 kg), SpO2 95%. Body mass index is 43.62 kg/m.  General: he is overweight, cooperative and in no acute distress.   HEENT: EOMI, sclerae are anicteric. Lungs: Normal breathing effort, no conversational dyspnea. M-Sk:  Normal gross ROM * 4 extremities  PSYCH: Has normal mood, affect and thought process. Neurologic: No gross sensory or motor deficits. Well developed, A and O * 3  DIAGNOSTIC DATA REVIEWED:  BMET    Component Value Date/Time   NA 143 08/26/2023 1244   K 4.6 08/26/2023 1244   CL 105 08/26/2023 1244   CO2 22 08/26/2023 1244   GLUCOSE 79 08/26/2023 1244    GLUCOSE 102 (H) 04/14/2023 0738   BUN 18 08/26/2023 1244   CREATININE 1.02 08/26/2023 1244   CREATININE 0.94 02/13/2022 0841   CALCIUM 9.4 08/26/2023 1244   GFRNONAA 41 (L) 03/07/2022 1614   GFRAA 57 (L) 09/27/2019 0902   Lab Results  Component Value Date   HGBA1C 5.5  08/26/2023   Lab Results  Component Value Date   INSULIN 11.9 08/26/2023   Lab Results  Component Value Date   TSH 3.630 08/26/2023   CBC    Component Value Date/Time   WBC 8.2 08/26/2023 1244   WBC 9.0 07/24/2023 0803   RBC 5.19 08/26/2023 1244   RBC 4.92 07/24/2023 0803   HGB 15.7 08/26/2023 1244   HCT 47.2 08/26/2023 1244   PLT 274 08/26/2023 1244   MCV 91 08/26/2023 1244   MCH 30.3 08/26/2023 1244   MCH 30.4 03/07/2022 1614   MCHC 33.3 08/26/2023 1244   MCHC 33.4 07/24/2023 0803   RDW 12.7 08/26/2023 1244   Iron Studies    Component Value Date/Time   IRON 91 12/17/2022 0902   TIBC 260.4 12/17/2022 0902   FERRITIN 154 02/14/2021 0812   IRONPCTSAT 34.9 12/17/2022 0902   IRONPCTSAT 33 02/14/2021 0812   Lipid Panel     Component Value Date/Time   CHOL 195 08/26/2023 1244   TRIG 97 08/26/2023 1244   HDL 39 (L) 08/26/2023 1244   CHOLHDL 5 04/14/2023 0738   VLDL 20.2 04/14/2023 0738   LDLCALC 138 (H) 08/26/2023 1244   LDLCALC 96 02/13/2022 0841   Hepatic Function Panel     Component Value Date/Time   PROT 7.6 08/26/2023 1244   ALBUMIN 4.2 08/26/2023 1244   AST 21 08/26/2023 1244   ALT 21 08/26/2023 1244   ALKPHOS 111 08/26/2023 1244   BILITOT 0.6 08/26/2023 1244   BILIDIR 0.0 08/13/2016 0819      Component Value Date/Time   TSH 3.630 08/26/2023 1244   Nutritional Lab Results  Component Value Date   VD25OH 41.2 08/26/2023   VD25OH 31.34 04/14/2023   VD25OH 14.52 (L) 12/09/2022    Attestations:   Reviewed by clinician on day of visit: allergies, medications, problem list, medical history, surgical history, family history, social history, and previous encounter notes pertinent  to patient's obesity diagnosis.   I have spent 60  minutes in the care of the patient today including: preparing to see patient (e.g. review and interpretation of tests, old notes ), obtaining and/or reviewing separately obtained history, performing a medically appropriate examination or evaluation, counseling and educating the patient, ordering medications, test or procedures, documenting clinical information in the electronic or other health care record, and independently interpreting results and communicating results to the patient, family, or caregiver   I, Special Randolm Idol, acting as a Stage manager for Thomasene Lot, DO., have compiled all relevant documentation for today's office visit on behalf of Thomasene Lot, DO, while in the presence of [employer], DO.  I have reviewed the above documentation for accuracy and completeness, and I agree with the above. [name], D.O.  The 21st Century Cures Act was signed into law in 2016 which includes the topic of electronic health records.  This provides immediate access to information in MyChart.  This includes consultation notes, operative notes, office notes, lab results and pathology reports.  If you have any questions about what you read please let us know at your next visit so we can discuss your concerns and take corrective action if need be.  We are right here with you.

## 2023-09-25 ENCOUNTER — Encounter: Payer: Self-pay | Admitting: Family Medicine

## 2023-09-25 ENCOUNTER — Telehealth: Payer: Self-pay | Admitting: Nurse Practitioner

## 2023-09-25 DIAGNOSIS — R310 Gross hematuria: Secondary | ICD-10-CM | POA: Insufficient documentation

## 2023-09-25 DIAGNOSIS — M25511 Pain in right shoulder: Secondary | ICD-10-CM | POA: Diagnosis not present

## 2023-09-25 NOTE — Telephone Encounter (Signed)
[facility] calling to get OV notes Face to Face Notes and a Prescription sent in for pt CPAP   Fax: [phone]

## 2023-10-02 DIAGNOSIS — M19011 Primary osteoarthritis, right shoulder: Secondary | ICD-10-CM | POA: Diagnosis not present

## 2023-10-09 ENCOUNTER — Other Ambulatory Visit: Payer: Self-pay | Admitting: Family Medicine

## 2023-10-13 ENCOUNTER — Other Ambulatory Visit: Payer: Medicare Other

## 2023-10-16 ENCOUNTER — Telehealth: Payer: Self-pay

## 2023-10-16 NOTE — Telephone Encounter (Signed)
 Per pt he is upset due to the fact he got a bill for Anesthesia. He states that I told him he was approved.  I explained that I only get approval for the procedure.  Per pt he states that we are ripping patients off because the anesthesia isn't covered and no one told him about it. Pt states he doesn't want to come to a place that isn't honest. Pt ask how can the md and procedure be in [facility] and covered but not anesthesia. Pt ask what was I going to do. I explained that from now on I would make sure I explain to pt that Anesthesia would be covered differently.

## 2023-10-19 ENCOUNTER — Encounter: Payer: Self-pay | Admitting: Urology

## 2023-10-20 ENCOUNTER — Ambulatory Visit: Payer: Medicare Other | Admitting: Family Medicine

## 2023-10-21 ENCOUNTER — Encounter: Payer: Self-pay | Admitting: Family Medicine

## 2023-10-21 ENCOUNTER — Ambulatory Visit (INDEPENDENT_AMBULATORY_CARE_PROVIDER_SITE_OTHER): Payer: Medicare Other | Admitting: Family Medicine

## 2023-10-21 VITALS — BP 120/84 | HR 99 | Temp 98.2°F | Ht 75.0 in | Wt 342.2 lb

## 2023-10-21 DIAGNOSIS — E291 Testicular hypofunction: Secondary | ICD-10-CM

## 2023-10-21 DIAGNOSIS — R7989 Other specified abnormal findings of blood chemistry: Secondary | ICD-10-CM | POA: Diagnosis not present

## 2023-10-21 DIAGNOSIS — G4733 Obstructive sleep apnea (adult) (pediatric): Secondary | ICD-10-CM | POA: Diagnosis not present

## 2023-10-21 DIAGNOSIS — F331 Major depressive disorder, recurrent, moderate: Secondary | ICD-10-CM

## 2023-10-21 MED ORDER — BUPROPION HCL ER (SR) 100 MG PO TB12: 100.0000 mg | ORAL_TABLET | Freq: Two times a day (BID) | ORAL | 1 refills | Status: DC

## 2023-10-21 NOTE — Progress Notes (Signed)
 SUBJECTIVE:  Chief Complaint: Obesity  Interim History: He is down 9 lbs since his last visit on 09/23/23 Down 21 lbs since 08/16/23 TBW loss of 5.8%   Reports did well over the holidays overall and is trying to concentrate on adequate protein intake. Did not follow Cat. 4 plan exclusively. Does not have time to journal currently as back working at his previous position at [employer] which he had retired from.  Incorporating more plan greek yogurt with granola and blueberries. Discussed watching amounts of granola as can be high in calories and sugar. He has given up drinking sugar sweetened beverages and is limiting Pepsi Free or unsweetened teas to 2 daily.  Trying to incorporate more fiber and we discussed/provided hand out on Smart fruit choices.  We discussed the importance of regular meals and adequate protein intake for weight loss and overall health.  He did indulge in a minced meat pie his wife made for him for his Birthday yesterday and also had beef stew over noodles, but mindful of better choices and trying to make better choices for lean protein sources and lower carbohydrate choices overall. 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    Keagon is here to discuss his progress with his obesity treatment plan. He is on the Category 4 Plan and keeping a food journal and adhering to recommended goals of 1850-2050 calories and 130 grams of protein and states he is following his eating plan approximately 50-70 % of the time. He states he is not exercising 0 minutes 0 times per week. 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     OBJECTIVE: Visit Diagnoses: Problem List Items Addressed This Visit     Vitamin B12 deficiency   Vitamin D  deficiency   Other Visit Diagnoses       Insulin  resistance - new onset    -  Primary     Mixed hyperlipidemia         Other disorder of eating - emotional eating         Obesity:   Has lost 9 lbs  since last visit, but muscle mass loss primarily since last visit.  Reports being very active at work and we discussed continuing to focus on adequate protein intake , at least 130 grams daily.  Discussed ways to increase protein intake like Fairlife milk with meals to add protein. Discussed label reading to avoid higher sugar intake.  May need to focus on following Cat 4 plan as not consistently journaling due to busy schedule.  Follow up in 3 weeks with Dr. Midge.   Insulin  Resistance Last fasting insulin  was 11.9- not at goal of < 5. A1c was 5.5 - just at goal. Polyphagia:No Medication(s): None Lab Results  Component Value Date   HGBA1C 5.5 08/26/2023   Lab Results  Component Value Date   INSULIN  11.9 08/26/2023    Plan:  Continue working on nutrition plan to decrease simple carbohydrates, increase lean proteins and exercise to promote weight loss, improve glycemic control and prevent progression to Type 2 diabetes.  Doing well not drinking sugar sweetened beverages currently.  Discussed reading labels of things like his granola and yogurt to make sure he is limiting overall sugar intake.  Can use YUKA app to help with this as well.    Hyperlipidemia LDL is not at goal. Medication(s): None Cardiovascular risk factors: advanced age (older than 65 for men,  65 for women), dyslipidemia, male gender, obesity (BMI >= 30 kg/m2), and sedentary lifestyle  Lab Results  Component Value Date   CHOL 195 08/26/2023   HDL 39 (L) 08/26/2023   LDLCALC 138 (H) 08/26/2023   TRIG 97 08/26/2023   CHOLHDL 5 04/14/2023   CHOLHDL 5 12/09/2022   CHOLHDL 3.8 02/13/2022   Lab Results  Component Value Date   ALT 21 08/26/2023   AST 21 08/26/2023   ALKPHOS 111 08/26/2023   BILITOT 0.6 08/26/2023   The 10-year ASCVD risk score (Arnett DK, et al., 2019) is: 15.4%   Values used to calculate the score:     Age: 68 years     Sex: Male     Is Non-Hispanic African American: No     Diabetic:  No     Tobacco smoker: No     Systolic Blood Pressure: 128 mmHg     Is BP treated: No     HDL Cholesterol: 39 mg/dL     Total Cholesterol: 195 mg/dL  Plan: Not taking statin currently.  Continue to work on nutrition plan -decreasing simple carbohydrates, increasing lean proteins, decreasing saturated fats and cholesterol , avoiding trans fats and exercise as able to promote weight loss, improve lipids and decrease cardiovascular risks. Continue to monitor lipids and consider adding statin or other therapy to address dyslipidemia /lower CV risks.     Vitamin D  Deficiency Vitamin D  is not at goal of 50.  Most recent vitamin D  level was 41.2. He is on OTC vitamin D3 2000 IU daily. No N/V or muscle weakness with Vitamin D .  Lab Results  Component Value Date   VD25OH 41.2 08/26/2023   VD25OH 31.34 04/14/2023   VD25OH 14.52 (L) 12/09/2022    Plan: Continue OTC vitamin D3 2000 IU daily Low vitamin D  levels can be associated with adiposity and may result in leptin resistance and weight gain. Also associated with fatigue. Currently on vitamin D  supplementation without any adverse effects.   OSA now using BiPAP:  Reports using BiPAP for at least 4 hours and sometimes up to 6 hours nightly. Wakes up at some point to urinate and then does not reapply and sleeps for another hour or so until up for work.  Plan: Intensive lifestyle modifications are the first line treatment for this issue. We discussed several lifestyle modifications today and he will continue to work on diet, exercise and weight loss efforts. We will continue to monitor. Continue BiPAP as instructed and encouraged to use for at least 6 hours nightly for best results.      Vitals Temp: 98.1 F (36.7 C) BP: 128/83 Pulse Rate: 76 SpO2: 98 %   Anthropometric Measurements Height: 6' 3 (1.905 m) Weight: (!) 340 lb (154.2 kg) BMI (Calculated): 42.5 Weight at Last Visit: 349 lb Weight Lost Since Last Visit: 9 lb Weight  Gained Since Last Visit: 0 Starting Weight: 361 lb Total Weight Loss (lbs): 20 lb (9.072 kg) Peak Weight: 361 lb   Body Composition  Body Fat %: 42.8 % Fat Mass (lbs): 145.8 lbs Muscle Mass (lbs): 185.6 lbs Total Body Water (lbs): 143.2 lbs Visceral Fat Rating : 29   Other Clinical Data Fasting: no Labs: no Today's Visit #: 3 Starting Date: 08/16/23     ASSESSMENT AND PLAN:  Diet: Eliav is currently in the action stage of change. As such, his goal is to continue with weight loss efforts. He has agreed to Category 4 Plan and keeping a food  journal and adhering to recommended goals of 1850-2050 calories and 130 grams of  protein.  Exercise: Letcher has been instructed that some exercise is better than none for weight loss and overall health benefits.   Behavior Modification:  We discussed the following Behavioral Modification Strategies today: increasing lean protein intake, decreasing simple carbohydrates, increasing vegetables, increase H2O intake, increase high fiber foods, meal planning and cooking strategies, better snacking choices, planning for success, and reading food labels for nutrition content . We discussed various medication options to help Severo with his weight loss efforts and we both agreed to continue to work on nutritional and behavioral strategies to promote weight loss.    Return in about 3 weeks (around 11/12/2023).SABRA He was informed of the importance of frequent follow up visits to maximize his success with intensive lifestyle modifications for his multiple health conditions.  Attestation Statements:   Reviewed by clinician on day of visit: allergies, medications, problem list, medical history, surgical history, family history, social history, and previous encounter notes.   Time spent on visit including pre-visit chart review and post-visit care and charting was 44 minutes.    Lovett Coffin, PA-C

## 2023-10-21 NOTE — Assessment & Plan Note (Addendum)
 Chronic, overall improved based on improved PHQ9 and GAD7 scores. He declines med dose changes at this time.  Will continue Wellbutrin SR 100mg  BID.

## 2023-10-21 NOTE — Assessment & Plan Note (Signed)
 He stopped topical testosterone - ineffective, unaffordable. Planning to discuss injectable testosterone replacement at next urology appt.

## 2023-10-21 NOTE — Assessment & Plan Note (Signed)
 Now on BiPAP through [facility].

## 2023-10-21 NOTE — Progress Notes (Signed)
 Ph: [phone] Fax: [phone]   Patient ID: Steve Lynch, male    DOB: 16-Sep-1956, 68 y.o.   MRN: [id number]  This visit was conducted in person.  BP 120/84   Pulse 99   Temp 98.2 F (36.8 C) (Oral)   Ht 6' 3 (1.905 m)   Wt (!) 342 lb 4 oz (155.2 kg)   SpO2 95%   BMI 42.78 kg/m    CC: 69mo follow up visit  Subjective:   HPI: Steve Lynch is a 68 y.o. male presenting on 10/21/2023 for Medical Management of Chronic Issues (Here for mood f/u.)   Upcoming colonoscopy 11/03/2023.  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   See prior note for details. Last visit we started wellbutrin  SR 100mg  bid for depressed mood. Notes overall tolerating well, overall improvement, desires to continue.   He self stopped testosterone  gel - was too expensive and didn't notice significant effect. Upcoming uro f/u - to discuss testosterone  replacement with them.   [facility] with ~20lb weight loss over the past few months. He's working on his own meal plan - yogurt for breakfast, meat and cheese wrap for lunch, has cut down on snacks during the day, only drinking 2 diet sodas/day rest of fluid is water. Increased fiber, fish, salmon.   OSA - recently started BiPAP 15/11 cm H2O about 2 wks ago, planned pulm f/u.      Relevant past medical, surgical, family and social history reviewed and updated as indicated. Interim medical history since our last visit reviewed. Allergies and medications reviewed and updated. Outpatient Medications Prior to Visit  Medication Sig Dispense Refill   Cholecalciferol (VITAMIN D ) 50 MCG (2000 UT) CAPS Take 1 capsule (2,000 Units total) by mouth daily. 30 capsule    cyanocobalamin  (V-R VITAMIN B-12) 500 MCG tablet Take 1 tablet (500 mcg total) by mouth daily.     buPROPion  ER (WELLBUTRIN  SR) 100 MG 12 hr tablet TAKE 1 TABLET BY MOUTH TWICE A DAY 180 tablet 2   Testosterone  20.25 MG/ACT (1.62%) GEL Place 3 Pump onto  the skin daily. 150 g 1   No facility-administered medications prior to visit.     Per HPI unless specifically indicated in ROS section below Review of Systems  Objective:  BP 120/84   Pulse 99   Temp 98.2 F (36.8 C) (Oral)   Ht 6' 3 (1.905 m)   Wt (!) 342 lb 4 oz (155.2 kg)   SpO2 95%   BMI 42.78 kg/m   Wt Readings from Last 3 Encounters:  10/21/23 (!) 342 lb 4 oz (155.2 kg)  09/23/23 (!) 349 lb (158.3 kg)  09/18/23 (!) 368 lb (166.9 kg)      Physical Exam Vitals and nursing note reviewed.  Constitutional:      Appearance: Normal appearance. He is not ill-appearing.  HENT:     Head: Normocephalic and atraumatic.     Mouth/Throat:     Mouth: Mucous membranes are moist.     Pharynx: Oropharynx is clear. No oropharyngeal exudate or posterior oropharyngeal erythema.  Eyes:     Extraocular Movements: Extraocular movements intact.     Pupils: Pupils are equal, round, and reactive to light.  Cardiovascular:     Rate and Rhythm: Normal rate and regular rhythm.     Pulses: Normal pulses.     Heart sounds: Normal heart sounds. No murmur heard. Pulmonary:     Effort: Pulmonary  effort is normal. No respiratory distress.     Breath sounds: Normal breath sounds. No wheezing, rhonchi or rales.  Musculoskeletal:     Right lower leg: No edema.     Left lower leg: No edema.  Skin:    General: Skin is warm and dry.     Findings: No rash.  Neurological:     Mental Status: He is alert.  Psychiatric:        Mood and Affect: Mood normal.        Behavior: Behavior normal.          10/21/2023    2:51 PM 08/27/2023    7:58 AM 04/16/2023    2:08 PM 01/08/2023    3:09 PM 12/16/2022    3:48 PM  Depression screen PHQ 2/9  Decreased Interest 0 1 1 0 1  Down, Depressed, Hopeless 0 2 0 1 1  PHQ - 2 Score 0 3 1 1 2   Altered sleeping  2 0 2 2  Tired, decreased energy  2 3 1 2   Change in appetite  2 2 2 3   Feeling bad or failure about yourself   1 1 1 1   Trouble concentrating  1 0 0 0   Moving slowly or fidgety/restless  0 0 0 0  Suicidal thoughts  0 0 0 0  PHQ-9 Score  11 7 7 10   Difficult doing work/chores  Not difficult at all Somewhat difficult Not difficult at all Not difficult at all       10/21/2023    2:51 PM 08/27/2023    7:59 AM 04/16/2023    2:08 PM 01/08/2023    3:09 PM  GAD 7 : Generalized Anxiety Score  Nervous, Anxious, on Edge 0 1 0 0  Control/stop worrying 0 1 0 1  Worry too much - different things 0 1 1 1   Trouble relaxing 0 0 0 0  Restless 0 0 0 0  Easily annoyed or irritable 0 1 1 1   Afraid - awful might happen 0 1 1 1   Total GAD 7 Score 0 5 3 4   Anxiety Difficulty  Not difficult at all Somewhat difficult Somewhat difficult   Assessment & Plan:   Problem List Items Addressed This Visit     OSA on CPAP   Now on BiPAP through [facility]ology.       Obesity, morbid, BMI 40.0-49.9 (HCC)   Congratulated on weight loss to date. He is motivated to continue healthy diet choices.  [facility].       MDD (major depressive disorder), recurrent episode, moderate (HCC) - Primary   Chronic, overall improved based on improved PHQ9 and GAD7 scores. He declines med dose changes at this time.  Will continue Wellbutrin  SR 100mg  BID.       Relevant Medications   buPROPion  ER (WELLBUTRIN  SR) 100 MG 12 hr tablet   Low testosterone  in male   Primary male hypogonadism   He stopped topical testosterone  - ineffective, unaffordable. Planning to discuss injectable testosterone  replacement at next urology appt.         Meds ordered this encounter  Medications   buPROPion  ER (WELLBUTRIN  SR) 100 MG 12 hr tablet    Sig: Take 1 tablet (100 mg total) by mouth 2 (two) times daily.    Dispense:  180 tablet    Refill:  1    No orders of the defined types were placed in this encounter.  Patient Instructions  Continue current medicines including wellbutrin  SR 100mg  twice daily  Schedule appointment with Dr Twylla Richard to see you today Return after 04/16/2023 for medicare wellness visit  Follow up plan: Return in about 6 months (around 04/19/2024) for medicare wellness visit, annual exam, prior fasting for blood work.  Steve Blas, MD

## 2023-10-21 NOTE — Patient Instructions (Addendum)
 Continue current medicines including wellbutrin SR 100mg  twice daily  Schedule appointment with Dr Jiles Harold to see you today Return after 04/16/2023 for medicare wellness visit

## 2023-10-21 NOTE — Assessment & Plan Note (Addendum)
 Congratulated on weight loss to date. He is motivated to continue healthy diet choices.  He continues seeing [facility].

## 2023-10-21 NOTE — Addendum Note (Signed)
 Addended by: [name] on: 10/21/2023 03:38 PM   Modules accepted: Level of Service

## 2023-10-22 ENCOUNTER — Ambulatory Visit (INDEPENDENT_AMBULATORY_CARE_PROVIDER_SITE_OTHER): Payer: Medicare Other | Admitting: Physician Assistant

## 2023-10-22 ENCOUNTER — Encounter (INDEPENDENT_AMBULATORY_CARE_PROVIDER_SITE_OTHER): Payer: Self-pay | Admitting: Physician Assistant

## 2023-10-22 VITALS — BP 128/83 | HR 76 | Temp 98.1°F | Ht 75.0 in | Wt 340.0 lb

## 2023-10-22 DIAGNOSIS — Z6841 Body Mass Index (BMI) 40.0 and over, adult: Secondary | ICD-10-CM

## 2023-10-22 DIAGNOSIS — E669 Obesity, unspecified: Secondary | ICD-10-CM

## 2023-10-22 DIAGNOSIS — E538 Deficiency of other specified B group vitamins: Secondary | ICD-10-CM

## 2023-10-22 DIAGNOSIS — E88819 Insulin resistance, unspecified: Secondary | ICD-10-CM

## 2023-10-22 DIAGNOSIS — E782 Mixed hyperlipidemia: Secondary | ICD-10-CM | POA: Diagnosis not present

## 2023-10-22 DIAGNOSIS — G4733 Obstructive sleep apnea (adult) (pediatric): Secondary | ICD-10-CM | POA: Diagnosis not present

## 2023-10-22 DIAGNOSIS — E559 Vitamin D deficiency, unspecified: Secondary | ICD-10-CM | POA: Diagnosis not present

## 2023-10-22 DIAGNOSIS — F5089 Other specified eating disorder: Secondary | ICD-10-CM

## 2023-10-24 ENCOUNTER — Ambulatory Visit: Payer: Medicare Other | Admitting: Nurse Practitioner

## 2023-11-03 ENCOUNTER — Ambulatory Visit: Payer: Medicare Other | Admitting: Certified Registered Nurse Anesthetist

## 2023-11-03 ENCOUNTER — Ambulatory Visit
Admission: RE | Admit: 2023-11-03 | Discharge: 2023-11-03 | Disposition: A | Discharge status: Home / Self Care | Payer: Medicare Other | Attending: Gastroenterology | Admitting: Gastroenterology

## 2023-11-03 ENCOUNTER — Encounter
Admission: RE | Disposition: A | Discharge status: Home / Self Care | Payer: Self-pay | Source: Home / Self Care | Attending: Gastroenterology

## 2023-11-03 DIAGNOSIS — Z1211 Encounter for screening for malignant neoplasm of colon: Secondary | ICD-10-CM | POA: Diagnosis not present

## 2023-11-03 DIAGNOSIS — I739 Peripheral vascular disease, unspecified: Secondary | ICD-10-CM | POA: Insufficient documentation

## 2023-11-03 DIAGNOSIS — E785 Hyperlipidemia, unspecified: Secondary | ICD-10-CM | POA: Diagnosis not present

## 2023-11-03 DIAGNOSIS — D122 Benign neoplasm of ascending colon: Secondary | ICD-10-CM | POA: Diagnosis not present

## 2023-11-03 DIAGNOSIS — K5730 Diverticulosis of large intestine without perforation or abscess without bleeding: Secondary | ICD-10-CM | POA: Insufficient documentation

## 2023-11-03 DIAGNOSIS — E6689 Other obesity not elsewhere classified: Secondary | ICD-10-CM | POA: Insufficient documentation

## 2023-11-03 DIAGNOSIS — G4733 Obstructive sleep apnea (adult) (pediatric): Secondary | ICD-10-CM | POA: Insufficient documentation

## 2023-11-03 DIAGNOSIS — F32A Depression, unspecified: Secondary | ICD-10-CM | POA: Diagnosis not present

## 2023-11-03 DIAGNOSIS — K635 Polyp of colon: Secondary | ICD-10-CM | POA: Diagnosis not present

## 2023-11-03 DIAGNOSIS — D126 Benign neoplasm of colon, unspecified: Secondary | ICD-10-CM

## 2023-11-03 DIAGNOSIS — Z6841 Body Mass Index (BMI) 40.0 and over, adult: Secondary | ICD-10-CM | POA: Insufficient documentation

## 2023-11-03 HISTORY — PX: POLYPECTOMY: SHX5525

## 2023-11-03 HISTORY — PX: COLONOSCOPY WITH PROPOFOL: SHX5780

## 2023-11-03 SURGERY — COLONOSCOPY WITH PROPOFOL
Anesthesia: General

## 2023-11-03 MED ORDER — PROPOFOL 500 MG/50ML IV EMUL
INTRAVENOUS | Status: DC | PRN
  Administered 2023-11-03: 40 mg via INTRAVENOUS
  Administered 2023-11-03: 60 mg via INTRAVENOUS
  Administered 2023-11-03: 200 ug/kg/min via INTRAVENOUS

## 2023-11-03 MED ORDER — SODIUM CHLORIDE 0.9 % IV SOLN
INTRAVENOUS | Status: DC
  Administered 2023-11-03: 20 mL/h via INTRAVENOUS

## 2023-11-03 MED ORDER — LIDOCAINE HCL (CARDIAC) PF 100 MG/5ML IV SOSY
PREFILLED_SYRINGE | INTRAVENOUS | Status: DC | PRN
  Administered 2023-11-03: 50 mg via INTRATRACHEAL

## 2023-11-03 MED ORDER — SIMETHICONE 40 MG/0.6ML PO SUSP
ORAL | Status: DC | PRN
  Administered 2023-11-03: 60 mL

## 2023-11-03 MED ORDER — SODIUM CHLORIDE 0.9 % IV SOLN: INTRAVENOUS | Status: DC | PRN

## 2023-11-03 NOTE — Transfer of Care (Signed)
Immediate Anesthesia Transfer of Care Note  Patient: Steve Lynch  Procedure(s) Performed: COLONOSCOPY WITH PROPOFOL POLYPECTOMY  Patient Location: PACU and Endoscopy Unit  Anesthesia Type:General  Level of Consciousness: awake, alert , oriented, and patient cooperative  Airway & Oxygen Therapy: Patient Spontanous Breathing  Post-op Assessment: Report given to RN and Post -op Vital signs reviewed and stable  Post vital signs: Reviewed and stable  Last Vitals:  Vitals Value Taken Time  BP    Temp    Pulse 79 11/03/23 1004  Resp    SpO2 95 % 11/03/23 1004  Vitals shown include unfiled device data.  Last Pain:  Vitals:   11/03/23 0835  TempSrc: Temporal  PainSc: 0-No pain         Complications: No notable events documented.

## 2023-11-03 NOTE — Anesthesia Postprocedure Evaluation (Signed)
Anesthesia Post Note  Patient: Steve Lynch  Procedure(s) Performed: COLONOSCOPY WITH PROPOFOL POLYPECTOMY  Patient location during evaluation: PACU Anesthesia Type: General Level of consciousness: awake Pain management: pain level controlled Vital Signs Assessment: post-procedure vital signs reviewed and stable Respiratory status: spontaneous breathing and nonlabored ventilation Cardiovascular status: stable Anesthetic complications: no   No notable events documented.   Last Vitals:  Vitals:   11/03/23 1011 11/03/23 1022  BP: 116/88 128/85  Pulse: 86 74  Resp: 16 18  Temp:    SpO2: 97% 98%    Last Pain:  Vitals:   11/03/23 1022  TempSrc:   PainSc: 0-No pain                 VAN STAVEREN,Ruchama Kubicek

## 2023-11-03 NOTE — Op Note (Signed)
[facility] Gastroenterology Patient Name: Steve Lynch Procedure Date: 11/03/2023 9:29 AM MRN: [id number] Account #: [id number] Date of Birth: [birth year] Admit Type: Outpatient Age: 68 Room: [facility] -  ENDO ROOM 1 Gender: Male Note Status: Finalized Instrument Name: Prentice Docker [id number] Procedure:             Colonoscopy Indications:           Screening for colorectal malignant neoplasm Providers:             Wyline Mood MD, MD Referring MD:          [name] (Referring MD) Medicines:             Monitored Anesthesia Care Complications:         No immediate complications. Procedure:             Pre-Anesthesia Assessment:                        - Prior to the procedure, a History and Physical was                         performed, and patient medications, allergies and                         sensitivities were reviewed. The patient's tolerance                         of previous anesthesia was reviewed.                        - The risks and benefits of the procedure and the                         sedation options and risks were discussed with the                         patient. All questions were answered and informed                         consent was obtained.                        - ASA Grade Assessment: II - A patient with mild                         systemic disease.                        After obtaining informed consent, the colonoscope was                         passed under direct vision. Throughout the procedure,                         the patient's blood pressure, pulse, and oxygen                         saturations were monitored continuously. The                         Colonoscope was introduced through  the anus and                         advanced to the the cecum, identified by the                         appendiceal orifice. The colonoscopy was performed                         with ease. The patient tolerated the procedure well.                          The quality of the bowel preparation was excellent.                         The ileocecal valve, appendiceal orifice, and rectum                         were photographed. Findings:      The perianal and digital rectal examinations were normal.      Two sessile polyps were found in the ascending colon. The polyps were 5       to 6 mm in size. These polyps were removed with a cold snare. Resection       and retrieval were complete.      Multiple medium-mouthed diverticula were found in the entire colon.      No additional abnormalities were found on retroflexion.      The exam was otherwise without abnormality on direct and retroflexion       views. Impression:            - Two 5 to 6 mm polyps in the ascending colon, removed                         with a cold snare. Resected and retrieved.                        - Diverticulosis in the entire examined colon.                        - The examination was otherwise normal on direct and                         retroflexion views. Recommendation:        - Discharge patient to home (with escort).                        - Resume previous diet.                        - Continue present medications.                        - Await pathology results.                        - Repeat colonoscopy for surveillance based on                         pathology results. Procedure Code(s):     --- Professional ---  16109, Colonoscopy, flexible; with removal of                         tumor(s), polyp(s), or other lesion(s) by snare                         technique Diagnosis Code(s):     --- Professional ---                        Z12.11, Encounter for screening for malignant neoplasm                         of colon                        D12.2, Benign neoplasm of ascending colon                        K57.30, Diverticulosis of large intestine without                         perforation or abscess without  bleeding CPT copyright 2022 American Medical Association. All rights reserved. The codes documented in this report are preliminary and upon coder review may  be revised to meet current compliance requirements. Wyline Mood, MD Wyline Mood MD, MD 11/03/2023 10:04:53 AM This report has been signed electronically. Number of Addenda: 0 Note Initiated On: 11/03/2023 9:29 AM Scope Withdrawal Time: 0 hours 12 minutes 0 seconds  Total Procedure Duration: 0 hours 14 minutes 6 seconds  Estimated Blood Loss:  Estimated blood loss: none.      [facility]
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
## 2023-11-04 ENCOUNTER — Encounter: Payer: Self-pay | Admitting: Gastroenterology

## 2023-11-04 LAB — SURGICAL PATHOLOGY

## 2023-11-06 ENCOUNTER — Encounter: Payer: Self-pay | Admitting: Family Medicine

## 2023-11-12 ENCOUNTER — Ambulatory Visit (INDEPENDENT_AMBULATORY_CARE_PROVIDER_SITE_OTHER): Payer: Medicare Other | Admitting: Family Medicine

## 2023-11-12 ENCOUNTER — Encounter (INDEPENDENT_AMBULATORY_CARE_PROVIDER_SITE_OTHER): Payer: Self-pay | Admitting: Family Medicine

## 2023-11-12 DIAGNOSIS — F5089 Other specified eating disorder: Secondary | ICD-10-CM

## 2023-11-12 DIAGNOSIS — Z6841 Body Mass Index (BMI) 40.0 and over, adult: Secondary | ICD-10-CM

## 2023-11-12 DIAGNOSIS — E88819 Insulin resistance, unspecified: Secondary | ICD-10-CM

## 2023-11-12 NOTE — Progress Notes (Signed)
Steve Lynch, D.O.  ABFM, ABOM Specializing in Clinical Bariatric Medicine  Office located at: [address] [name], [place]  [address]   Assessment and Plan:   FOR THE DISEASE OF OBESITY: Obesity, morbid, BMI 40.0-49.9 (HCC) BMI 40.0-44.9, adult (HCC) - current BMI 41.87 Assessment & Plan: Since last office visit with Steve Rayburn, PA-C on 10/22/23 patient's muscle mass has decreased by 0.6lb. Fat mass has decreased by 4.6lb. Total body water has increased by 0.2lb.  Counseling done on how various foods will affect these numbers and how to maximize success  Total lbs lost to date: 26 lbs Total weight loss percentage to date: -7.20%    Recommended Dietary Goals Steve Lynch is currently in the action stage of change. As such, his goal is to continue weight management plan.  He has agreed to: continue current plan   Behavioral Intervention We discussed the following today: increasing lean protein intake to established goals, decreasing simple carbohydrates , avoiding skipping meals, increasing water intake , decreasing eating out or consumption of processed foods, and making healthy choices when eating convenient foods, avoiding temptations and identifying enticing environmental cues, and continue to work on maintaining a reduced calorie state, getting the recommended amount of protein, incorporating whole foods, making healthy choices, staying well hydrated and practicing mindfulness when eating.  Additional resources provided today: None  Evidence-based interventions for health behavior change were utilized today including the discussion of self monitoring techniques, problem-solving barriers and SMART goal setting techniques.   Regarding patient's less desirable eating habits and patterns, we employed the technique of small changes.   Pt will specifically work on: Measure protein after cooking weight to improve mindfulness for next visit.    Recommended Physical Activity  Goals Steve Lynch has been advised to work up to 150 minutes of moderate intensity aerobic activity a week and strengthening exercises 2-3 times per week for cardiovascular health, weight loss maintenance and preservation of muscle mass.   He has agreed to :  Think about enjoyable ways to increase daily physical activity and overcoming barriers to exercise, Increase physical activity in their day and reduce sedentary time (increase NEAT)., and will start Silver Sneakers on Monday    Pharmacotherapy We discussed various medication options to help Steve Lynch with his weight loss efforts and we both agreed to : continue with nutritional and behavioral strategies and adequate clinical response to current dose, continue current regimen   FOR ASSOCIATED CONDITIONS ADDRESSED TODAY: Other disorder of eating - emotional eating Assessment & Plan: Moods are stable currently. Denies any SI/HI. Pt started Wellbutrin on 08/27/23, managed by his PCP. Tolerating well with no side effects reported. He reports Wellbutrin has helped with his moods and emotional eating.   Continue with Wellbutrin as directed by PCP. I recommend he follow up with PCP as instructed by them for continued management of this medication. Encouraged pt to eat on plan by prioritizing proteins and decreasing simple carbs/sugars to help with hunger, cravings, and emotional eating. We will monitor his condition as it relates to weight loss alongside PCP. No changes made today.    Insulin resistance - new onset Assessment & Plan: Lab Results  Component Value Date   HGBA1C 5.5 08/26/2023   INSULIN 11.9 08/26/2023    Not on medications currently. Diet/exercise approach. Pt reports off-plan eating 1-2 times a week, likes to treat himself  to off-plan meals when going out with friends or social gathering. Pt does not want to go on any medications.  Encouraged pt to decrease saturated/trans fats. Avoid skipping meals. Reviewed strategies for staying  on plan when going to social gatherings such as eating before the gathering so he is full/satisfied. Continue to follow prudent nutritional meal plan and increase exercise. Encouraged to start measuring his protein after cooking in an effort to increase mindfulness. Will continue to monitor condition.    Follow up:   Return in about 3 weeks (around 12/03/2023). He was informed of the importance of frequent follow up visits to maximize his success with intensive lifestyle modifications for his multiple health conditions.  Subjective:   Chief complaint: Obesity Steve is here to discuss his progress with his obesity treatment plan. He is on the the Category 4 Plan and keeping a food journal and adhering to recommended goals of 1850-2050 calories and 130+ g of protein and states he is following his eating plan approximately 90% of the time. He states he is not exercising. Plans to start Silver Sneakers on Monday (11/17/23).   Interval History:  Steve Lynch is here for a follow up office visit. Since last OV, he is down 5 lbs. Reports eating off plan for 1-2 meals a week, uses this as his "treat" day for social gatherings. States he went to the [employer] this weekend and ate barbeque, ribs, and beef. Has cut back on snacking and only snacks on string cheese. When eating salads he uses a light virgin oil and white vinegar. Has not been measuring or tracking. Today had 2 wraps for lunch, usually has 1 wrap.    Barriers identified: low volume of physical activity at present .   Pharmacotherapy for weight loss: He is currently taking Bupropion (single agent, off label use) with adequate clinical response  and without side effects..   Review of Systems:  Pertinent positives were addressed with patient today.  Reviewed by clinician on day of visit: allergies, medications, problem list, medical history, surgical history, family history, social history, and previous encounter notes.  Weight Summary  and Biometrics   Weight Lost Since Last Visit: 5lb  Weight Gained Since Last Visit: 0   Vitals Temp: 97.7 F (36.5 C) BP: 127/86 Pulse Rate: 79 SpO2: 97 %   Anthropometric Measurements Height: 6\' 3"  (1.905 m) Weight: (!) 335 lb (152 kg) BMI (Calculated): 41.87 Weight at Last Visit: 340lb Weight Lost Since Last Visit: 5lb Weight Gained Since Last Visit: 0 Starting Weight: 361lb Total Weight Loss (lbs): 25 lb (11.3 kg) Peak Weight: 361lb   Body Composition  Body Fat %: 42.1 % Fat Mass (lbs): 141.2 lbs Muscle Mass (lbs): 185 lbs Total Body Water (lbs): 143.4 lbs Visceral Fat Rating : 28   Other Clinical Data Fasting: no Labs: no Today's Visit #: 4 Starting Date: 08/16/23    Objective:   PHYSICAL EXAM: Blood pressure 127/86, pulse 79, temperature 97.7 F (36.5 C), height 6\' 3"  (1.905 m), weight (!) 335 lb (152 kg), SpO2 97%. Body mass index is 41.87 kg/m.  General: he is overweight, cooperative and in no acute distress. PSYCH: Has normal mood, affect and thought process.   HEENT: EOMI, sclerae are anicteric. Lungs: Normal breathing effort, no conversational dyspnea. Extremities: Moves * 4 Neurologic: A and O * 3, good insight  DIAGNOSTIC DATA REVIEWED: BMET    Component Value Date/Time   NA 143 08/26/2023 1244   K 4.6 08/26/2023 1244   CL 105 08/26/2023 1244   CO2 22 08/26/2023 1244   GLUCOSE 79 08/26/2023 1244   GLUCOSE  102 (H) 04/14/2023 0738   BUN 18 08/26/2023 1244   CREATININE 1.02 08/26/2023 1244   CREATININE 0.94 02/13/2022 0841   CALCIUM 9.4 08/26/2023 1244   GFRNONAA 41 (L) 03/07/2022 1614   GFRAA 57 (L) 09/27/2019 0902   Lab Results  Component Value Date   HGBA1C 5.5 08/26/2023   Lab Results  Component Value Date   INSULIN 11.9 08/26/2023   Lab Results  Component Value Date   TSH 3.630 08/26/2023   CBC    Component Value Date/Time   WBC 8.2 08/26/2023 1244   WBC 9.0 07/24/2023 0803   RBC 5.19 08/26/2023 1244   RBC 4.92  07/24/2023 0803   HGB 15.7 08/26/2023 1244   HCT 47.2 08/26/2023 1244   PLT 274 08/26/2023 1244   MCV 91 08/26/2023 1244   MCH 30.3 08/26/2023 1244   MCH 30.4 03/07/2022 1614   MCHC 33.3 08/26/2023 1244   MCHC 33.4 07/24/2023 0803   RDW 12.7 08/26/2023 1244   Iron Studies    Component Value Date/Time   IRON 91 12/17/2022 0902   TIBC 260.4 12/17/2022 0902   FERRITIN 154 02/14/2021 0812   IRONPCTSAT 34.9 12/17/2022 0902   IRONPCTSAT 33 02/14/2021 0812   Lipid Panel     Component Value Date/Time   CHOL 195 08/26/2023 1244   TRIG 97 08/26/2023 1244   HDL 39 (L) 08/26/2023 1244   CHOLHDL 5 04/14/2023 0738   VLDL 20.2 04/14/2023 0738   LDLCALC 138 (H) 08/26/2023 1244   LDLCALC 96 02/13/2022 0841   Hepatic Function Panel     Component Value Date/Time   PROT 7.6 08/26/2023 1244   ALBUMIN 4.2 08/26/2023 1244   AST 21 08/26/2023 1244   ALT 21 08/26/2023 1244   ALKPHOS 111 08/26/2023 1244   BILITOT 0.6 08/26/2023 1244   BILIDIR 0.0 08/13/2016 0819      Component Value Date/Time   TSH 3.630 08/26/2023 1244   Nutritional Lab Results  Component Value Date   VD25OH 41.2 08/26/2023   VD25OH 31.34 04/14/2023   VD25OH 14.52 (L) 12/09/2022    Attestations:   Burnett Sheng, acting as a medical scribe for Thomasene Lot, DO., have compiled all relevant documentation for today's office visit on behalf of Thomasene Lot, DO, while in the presence of [employer], DO.  Reviewed by clinician on day of visit: allergies, medications, problem list, medical history, surgical history, family history, social history, and previous encounter notes pertinent to patient's obesity diagnosis.  I have reviewed the above documentation for accuracy and completeness, and I agree with the above. Steve Lynch, D.O.  The 21st Century Cures Act was signed into law in 2016 which includes the topic of electronic health records.  This provides immediate access to information in MyChart.   This includes consultation notes, operative notes, office notes, lab results and pathology reports.  If you have any questions about what you read please let us know at your next visit so we can discuss your concerns and take corrective action if need be.  We are right here with you.

## 2023-11-17 ENCOUNTER — Encounter: Payer: Self-pay | Admitting: Nurse Practitioner

## 2023-11-17 ENCOUNTER — Ambulatory Visit: Payer: Medicare Other | Admitting: Nurse Practitioner

## 2023-11-17 VITALS — BP 124/76 | HR 95 | Temp 97.5°F | Ht 76.0 in | Wt 335.8 lb

## 2023-11-17 DIAGNOSIS — G4733 Obstructive sleep apnea (adult) (pediatric): Secondary | ICD-10-CM

## 2023-11-17 DIAGNOSIS — Z6841 Body Mass Index (BMI) 40.0 and over, adult: Secondary | ICD-10-CM

## 2023-11-17 NOTE — Assessment & Plan Note (Signed)
BMI 40. Healthy weight loss encouraged.  

## 2023-11-17 NOTE — Progress Notes (Signed)
@Patient  ID: Steve Lynch, male    DOB: 1956-10-13, 68 y.o.   MRN: [id number]  Chief Complaint  Patient presents with   Follow-up    BIPAP drying throat out.  Mask leaking more air during night    Referring provider: [name], MD  HPI: 68 year old male, never smoker followed for OSA on CPAP. He is a patient of Dr. Trena Platt and last seen in office 08/13/2023. Past medical history significant for cardiomegaly, aortic regurgitation, essential tremor, DDD, CKD, BPH, HLD, depression, obesity.   TEST/EVENTS:  2018 HST: AHI 63/h 05/13/2023 echo: EF 55-60%, GIDD. RV size and function nl. Normal PASP. AR mild to moderate. Mild dilatation of aortic root.  08/29/2023 BiPAP titration >>optimal pressure 15/11; weight 368 lb   03/19/2022: OV with Dr. Wynona Neat. OSA diagnosed in June 2018. Has been on CPAP auto 5-20 cmH2O. Tolerating CPAP well. Does CPAP about 5 hr 40 min every night. Wakes up rejuvenated. Starts feeling sleepy about 4-5 hours into the day. Sometimes has to nap. Residual AHI 6.1. 92% compliant. Persistent daytime sleepiness - start armodafinil.   08/13/2023: OV with Rilyn Scroggs NP for follow up. He has a history of severe sleep apnea and is on CPAP. He does not wear it every night. He tends to wear it a few nights and then skip one or two. He did miss about two weeks this past month [place] following [employer]. Even with wearing CPAP, he doesn't feel like his daytime fatigue symptoms are any better. He's always tired. Wakes up in the morning around 7 am then he takes a nap just a few hours later. He's very frustrated by his chronic fatigue symptoms. He denies any drowsy driving, sleep parasomnias/paralysis. The armodafinil was not very effective. He did start testosterone injections but hasn't noticed a huge change yet.  He does also feel like he may be depressed. He doesn't want to do much during the day. Just feels like his drive is  gone. He enjoys going fishing still but if he's not at [employer], he usually just sits in his recliner at home. He has had suicidal ideation in the past but this was years ago after a serious injury to his left leg. He went and saw a counselor, which helped tremendously. They retired and he hasn't found anyone since. He has not had any SI/HI since this time.  He is wanting to work on weight loss measures. He's going to medical weight management tomorrow for his first appointment.    11/17/2023: Today - follow up Patient presents today for follow-up.  After his last visit, he had titration study which showed that he was not well-controlled on CPAP therapy.  He was transitioned to BiPAP with optimal pressure setting of 15/11.  Weight at the time of his study was 368 pounds.  He has received his BiPAP.  He is having difficulties with getting dried out at night. Feels like he wakes up after 4 hours and has a very dry mouth and occasionally some throat irritation.  He is wearing the same mask that he got during his study.  Has not changed out the cushions recently.  Has noticed a little more leakage.  He does not necessarily feel like the pressures are too high or blowing too hard.  Has not tried to adjust his humidity settings.  He does feel like his fatigue symptoms are a little bit better.  He is also  been working on weight loss with medical weight management.  This is seem to help a lot with his mood.  He also started Wellbutrin.  He is down about 30 pounds since Thanksgiving.  10/15/2023-11/13/2023 BiPAP 16/10 cmH2O, PS 4 28/30 days; 67% >4 hr; average use 4 hours 48 minutes Pressure 95th IPAP 15.2, EPAP 11.3 Leaks 95th 34.5 AHI 4.1  Allergies  Allergen Reactions   Oxycodone Rash    Immunization History  Administered Date(s) Administered   PFIZER(Purple Top)SARS-COV-2 Vaccination 01/06/2020, 01/27/2020, 10/12/2020   PNEUMOCOCCAL CONJUGATE-20 04/16/2023   Tdap 02/19/2022   Zoster  Recombinant(Shingrix) 02/21/2021, 06/20/2021    Past Medical History:  Diagnosis Date   Crushing injury of left thumb 09/15/2017   Drug abuse in remission (HCC) 1990s   Cocaine with rehab   Dyslipidemia    mild   Fracture of tibia with fibula, closed, right, sequela 10/16/2017   Kidney calculi    OSA (obstructive sleep apnea)    Peripheral vascular disease (HCC) 2004   varicose veins right leg   Tendonitis 10/2010   temporal tendonitis with locked jaw following tooth extraction   Vitamin B12 deficiency 09/2013    Tobacco History: Social History   Tobacco Use  Smoking Status Never  Smokeless Tobacco Never   Counseling given: Not Answered   Outpatient Medications Prior to Visit  Medication Sig Dispense Refill   buPROPion ER (WELLBUTRIN SR) 100 MG 12 hr tablet Take 1 tablet (100 mg total) by mouth 2 (two) times daily. 180 tablet 1   Cholecalciferol (VITAMIN D) 50 MCG (2000 UT) CAPS Take 1 capsule (2,000 Units total) by mouth daily. 30 capsule    cyanocobalamin (V-R VITAMIN B-12) 500 MCG tablet Take 1 tablet (500 mcg total) by mouth daily.     No facility-administered medications prior to visit.     Review of Systems:   Constitutional: No weight loss, night sweats, fevers, chills,or lassitude. +daytime fatigue, weight loss HEENT: No headaches, difficulty swallowing, tooth/dental problems, or sore throat. No sneezing, itching, ear ache, nasal congestion, or post nasal drip CV:  No chest pain, orthopnea, PND, swelling in lower extremities, anasarca, dizziness, palpitations, syncope Resp: No shortness of breath with exertion or at rest. No excess mucus or change in color of mucus. No productive or non-productive. No hemoptysis. No wheezing.  No chest wall deformity GI:  No heartburn, indigestion, abdominal pain, nausea, vomiting, diarrhea, change in bowel habits, loss of appetite, bloody stools.  GU: No nocturia Skin: No rash, lesions, ulcerations MSK:  No joint pain or  swelling.  Neuro: No dizziness or lightheadedness.  Psych: +depression (improved). No anxiety. No SI/HI. Mood stable.     Physical Exam:  BP 124/76 (BP Location: Right Arm, Patient Position: Sitting, Cuff Size: Large)   Pulse 95   Temp (!) 97.5 F (36.4 C) (Temporal)   Ht 6\' 4"  (1.93 m)   Wt (!) 335 lb 12.8 oz (152.3 kg)   SpO2 98%   BMI 40.87 kg/m   GEN: Pleasant, interactive, well-appearing; morbidly obese; in no acute distress HEENT:  Normocephalic and atraumatic. PERRLA. Sclera white. Nasal turbinates pink, moist and patent bilaterally. No rhinorrhea present. Oropharynx pink and moist, without exudate or edema. No lesions, ulcerations, or postnasal drip. Mallampati III/IV NECK:  Supple w/ fair ROM. No JVD present. Normal carotid impulses w/o bruits. Thyroid symmetrical with no goiter or nodules palpated. No lymphadenopathy.   CV: RRR, no m/r/g, no peripheral edema. Pulses intact, +2 bilaterally. No cyanosis, pallor or clubbing. PULMONARY:  Unlabored, regular breathing. Clear bilaterally A&P w/o wheezes/rales/rhonchi. No accessory muscle use.  GI: BS present and normoactive. Soft, non-tender to palpation. No organomegaly or masses detected.  MSK: No erythema, warmth or tenderness. Cap refil <2 sec all extrem. No deformities or joint swelling noted.  Neuro: A/Ox3. No focal deficits noted.   Skin: Warm, no lesions or rashe Psych: Normal affect and behavior. Judgement and thought content appropriate.     Lab Results:  CBC    Component Value Date/Time   WBC 8.2 08/26/2023 1244   WBC 9.0 07/24/2023 0803   RBC 5.19 08/26/2023 1244   RBC 4.92 07/24/2023 0803   HGB 15.7 08/26/2023 1244   HCT 47.2 08/26/2023 1244   PLT 274 08/26/2023 1244   MCV 91 08/26/2023 1244   MCH 30.3 08/26/2023 1244   MCH 30.4 03/07/2022 1614   MCHC 33.3 08/26/2023 1244   MCHC 33.4 07/24/2023 0803   RDW 12.7 08/26/2023 1244   LYMPHSABS 1.5 08/26/2023 1244   MONOABS 0.7 07/24/2023 0803   EOSABS 0.2  08/26/2023 1244   BASOSABS 0.1 08/26/2023 1244    BMET    Component Value Date/Time   NA 143 08/26/2023 1244   K 4.6 08/26/2023 1244   CL 105 08/26/2023 1244   CO2 22 08/26/2023 1244   GLUCOSE 79 08/26/2023 1244   GLUCOSE 102 (H) 04/14/2023 0738   BUN 18 08/26/2023 1244   CREATININE 1.02 08/26/2023 1244   CREATININE 0.94 02/13/2022 0841   CALCIUM 9.4 08/26/2023 1244   GFRNONAA 41 (L) 03/07/2022 1614   GFRAA 57 (L) 09/27/2019 0902    BNP No results found for: "BNP"   Imaging:  No results found.  Administration History     None           No data to display          No results found for: "NITRICOXIDE"      Assessment & Plan:   OSA on CPAP Severe OSA.  Poorly controlled on CPAP with elevated residual AHI's and significant daytime burden.  Underwent CPAP titration study in November 2024 and was transition to BiPAP therapy.  He has had around a 30 pound weight loss since his sleep study.  Having some difficulties with the BiPAP drying him out.  Advised him to change out his mask cushion and adjust his humidity settings.  Also try saline nasal gel prior to putting on his mask at bedtime.  We will try adjusting his settings to max IPAP 14 and min EPAP 8.  Reassess at follow-up.  Encouraged to continue working on healthy weight loss measures.  May need a mask fitting with continued weight loss -advised him to monitor for persistent leaks and mask seal.  Aware of risks of untreated sleep apnea.  Stage REM practices reviewed.  Patient Instructions  Continue to use BiPAP every night, minimum of 4-6 hours a night.  Change equipment as directed. Wash your tubing with warm soap and water daily, hang to dry. Wash humidifier portion weekly. Use bottled, distilled water and change daily Be aware of reduced alertness and do not drive or operate heavy machinery if experiencing this or drowsiness.  Exercise encouraged, as tolerated. Healthy weight management discussed.  Avoid  or decrease alcohol consumption and medications that make you more sleepy, if possible. Notify if persistent daytime sleepiness occurs even with consistent use of PAP therapy.  I'll adjust your settings to IPAP max 14 and EPAP min 8 Adjust your humidity setting up at  home Change out your mask Try saline nasal gel in each nostril before you put your mask on  Congrats on the weight loss! We may need to resize your mask as you lose the weight so keep an eye on fit and leaks   Follow up in 6 weeks with Dr. Wynona Neat or Florentina Addison Tiernan Suto,NP to see how changes are working. If symptoms do not improve or worsen, please contact office for sooner follow up   Obesity, morbid, BMI 40.0-49.9 (HCC) BMI 40. Healthy weight loss encouraged    I spent 35 minutes of dedicated to the care of this patient on the date of this encounter to include pre-visit review of records, face-to-face time with the patient discussing conditions above, post visit ordering of testing, clinical documentation with the electronic health record, making appropriate referrals as documented, and communicating necessary findings to members of the patients care team.  Noemi Chapel, NP 11/17/2023  Pt aware and understands NP's role.

## 2023-11-17 NOTE — Patient Instructions (Addendum)
Continue to use BiPAP every night, minimum of 4-6 hours a night.  Change equipment as directed. Wash your tubing with warm soap and water daily, hang to dry. Wash humidifier portion weekly. Use bottled, distilled water and change daily Be aware of reduced alertness and do not drive or operate heavy machinery if experiencing this or drowsiness.  Exercise encouraged, as tolerated. Healthy weight management discussed.  Avoid or decrease alcohol consumption and medications that make you more sleepy, if possible. Notify if persistent daytime sleepiness occurs even with consistent use of PAP therapy.  I'll adjust your settings to IPAP max 14 and EPAP min 8 Adjust your humidity setting up at home Change out your mask Try saline nasal gel in each nostril before you put your mask on  Congrats on the weight loss! We may need to resize your mask as you lose the weight so keep an eye on fit and leaks   Follow up in 6 weeks with Dr. Wynona Neat or Florentina Addison Aziah Kaiser,NP to see how changes are working. If symptoms do not improve or worsen, please contact office for sooner follow up

## 2023-11-17 NOTE — Assessment & Plan Note (Signed)
Severe OSA.  Poorly controlled on CPAP with elevated residual AHI's and significant daytime burden.  Underwent CPAP titration study in November 2024 and was transition to BiPAP therapy.  He has had around a 30 pound weight loss since his sleep study.  Having some difficulties with the BiPAP drying him out.  Advised him to change out his mask cushion and adjust his humidity settings.  Also try saline nasal gel prior to putting on his mask at bedtime.  We will try adjusting his settings to max IPAP 14 and min EPAP 8.  Reassess at follow-up.  Encouraged to continue working on healthy weight loss measures.  May need a mask fitting with continued weight loss -advised him to monitor for persistent leaks and mask seal.  Aware of risks of untreated sleep apnea.  Stage REM practices reviewed.  Patient Instructions  Continue to use BiPAP every night, minimum of 4-6 hours a night.  Change equipment as directed. Wash your tubing with warm soap and water daily, hang to dry. Wash humidifier portion weekly. Use bottled, distilled water and change daily Be aware of reduced alertness and do not drive or operate heavy machinery if experiencing this or drowsiness.  Exercise encouraged, as tolerated. Healthy weight management discussed.  Avoid or decrease alcohol consumption and medications that make you more sleepy, if possible. Notify if persistent daytime sleepiness occurs even with consistent use of PAP therapy.  I'll adjust your settings to IPAP max 14 and EPAP min 8 Adjust your humidity setting up at home Change out your mask Try saline nasal gel in each nostril before you put your mask on  Congrats on the weight loss! We may need to resize your mask as you lose the weight so keep an eye on fit and leaks   Follow up in 6 weeks with Dr. Wynona Neat or Florentina Addison Voyd Groft,NP to see how changes are working. If symptoms do not improve or worsen, please contact office for sooner follow up

## 2023-12-03 ENCOUNTER — Encounter (INDEPENDENT_AMBULATORY_CARE_PROVIDER_SITE_OTHER): Payer: Self-pay | Admitting: Family Medicine

## 2023-12-03 ENCOUNTER — Ambulatory Visit (INDEPENDENT_AMBULATORY_CARE_PROVIDER_SITE_OTHER): Payer: Medicare Other | Admitting: Family Medicine

## 2023-12-03 DIAGNOSIS — Z6841 Body Mass Index (BMI) 40.0 and over, adult: Secondary | ICD-10-CM | POA: Diagnosis not present

## 2023-12-03 DIAGNOSIS — E88819 Insulin resistance, unspecified: Secondary | ICD-10-CM

## 2023-12-03 DIAGNOSIS — F5089 Other specified eating disorder: Secondary | ICD-10-CM | POA: Diagnosis not present

## 2023-12-03 MED ORDER — BUPROPION HCL ER (SR) 150 MG PO TB12
150.0000 mg | ORAL_TABLET | Freq: Two times a day (BID) | ORAL | 1 refills | Status: DC
Start: 2023-12-03 — End: 2024-01-07

## 2023-12-03 NOTE — Progress Notes (Signed)
Steve Lynch, D.O.  ABFM, ABOM Specializing in Clinical Bariatric Medicine  Office located at: [address] [place], [place]  [address]   Assessment and Plan:   FOR THE DISEASE OF OBESITY: BMI 40.0-44.9, adult (HCC) - Current BMI 41.25 Obesity, morbid, BMI 40.0-49.9 (HCC) Assessment & Plan: Since last office visit on 11/12/23 patient's muscle mass has decreased by 4.2lb. Fat mass has decreased by 0.8 lb. Total body water has decreased by 0.6 lb.  Counseling done on how various foods will affect these numbers and how to maximize success  Total lbs lost to date: 31 lbs Total weight loss percentage to date: -8.59%    Recommended Dietary Goals Steve Lynch is currently in the action stage of change. As such, his goal is to continue weight management plan.  He has agreed to: {EMWTLOSSPLAN:29297::"continue current plan"}  Aim to eat at least 30 g of protein for breakfast, 40 g for lunch, and 50 g for dinner.   Behavioral Intervention We discussed the following today: increasing lean protein intake to established goals, decreasing simple carbohydrates , increasing water intake , work on tracking and journaling calories using tracking application, reading food labels , and focusing on food with a 10:1 ratio of calories: grams of protein  Additional resources provided today: Handout and personalized instruction on tracking and journaling using Apps or handwriting and using logs provided, Handout on healthy eating and balanced plate, Handout on complex carbohydrates and lean sources of protein, and handout on CAT 3 meal plan  Evidence-based interventions for health behavior change were utilized today including the discussion of self monitoring techniques, problem-solving barriers and SMART goal setting techniques.   Regarding patient's less desirable eating habits and patterns, we employed the technique of small changes.   Pt will specifically work on: Measure protein after cooking  weight to improve mindfulness for next visit.    Recommended Physical Activity Goals Steve Lynch has been advised to work up to 150 minutes of moderate intensity aerobic activity a week and strengthening exercises 2-3 times per week for cardiovascular health, weight loss maintenance and preservation of muscle mass.   He has agreed to :  Think about enjoyable ways to increase daily physical activity and overcoming barriers to exercise and Increase physical activity in their day and reduce sedentary time (increase NEAT).   Pharmacotherapy We both agreed to: continue with nutritional and behavioral strategies and adequate clinical response to current dose, continue current regimen   FOR ASSOCIATED CONDITIONS ADDRESSED TODAY: Insulin resistance - new onset Assessment & Plan: Lab Results  Component Value Date   HGBA1C 5.5 08/26/2023   INSULIN 11.9 08/26/2023    No meds currently. Diet/exercise approach. Pt has been eating on plan but not measuring foods or weighing proteins.   Eat healthy diet by increasing lean proteins and decreasing simple carbs/sugars per his meal plan. Encouraged pt to stay properly hydrated by drinking at least half his body weight in ounces of water per day.   ***  Other disorder of eating - emotional eating Assessment & Plan: Moods are stable currently. Denies any SI/HI. He is on Wellbutrin 100 mg BID, managed by his PCP. Tolerating well with no SE. Pt notes he has not felt the effects of Wellbutrin on hunger/cravings. States he has cut back on snacking.   Discussed potentially increasing Wellbutrin to help with motivation, cravings, and emotional hunger. Pt is agreeable to this med change. Will increase Wellbutrin to 150 mg BID. Continue to work on following his  meal plan by increasing protein and decreasing simple carbs/sugars.   Orders: - Increase Wellbutrin to 150 mg BID.    Follow up:   Return in about 3 weeks (around 12/24/2023). He was informed of the  importance of frequent follow up visits to maximize his success with intensive lifestyle modifications for his multiple health conditions.  Subjective:   Chief complaint: Obesity Steve Lynch is here to discuss his progress with his obesity treatment plan. He is on the Category 4 Plan and keeping a food journal and adhering to recommended goals of 1850-2050 calories and 130+ g of protein and states he is following his eating plan approximately 80% of the time. He states he is going to the [employer] 60 minutes 1 day per week ([employer]ne[employer]).  Interval History:  Steve Lynch is here for a follow up office visit. Since last OV on 11/12/23, he is down 5 lbs. Pt not measuring or weighing, states he eats until he is comfortable. He has tried to prioritize his daily protein intake. For breakfast he tends to drink a protein shake and eats greek yogurt with fruit and granola. States he doesn't like the yogurt if he doesn't add the granola. For lunch he tends to eat a wrap with a flour tortilla, [place] or ham, and cheese. States he has cut back on snacking. Has increased his fiber intake.  Pharmacotherapy for weight loss: He is currently taking Bupropion (single agent, off label use) with adequate clinical response  and without side effects..   Review of Systems:  Pertinent positives were addressed with patient today.  Reviewed by clinician on day of visit: allergies, medications, problem list, medical history, surgical history, family history, social history, and previous encounter notes.  Weight Summary and Biometrics   Weight Lost Since Last Visit: 5 lb  Weight Gained Since Last Visit: 0    Vitals Temp: 98.1 F (36.7 C) BP: 119/76 Pulse Rate: 87 SpO2: 97 %   Anthropometric Measurements Height: 6\' 3"  (1.905 m) Weight: (!) 330 lb (149.7 kg) BMI (Calculated): 41.25 Weight at Last Visit: 335 lb Weight Lost Since Last Visit: 5 lb Weight Gained Since Last Visit: 0 Starting Weight: 361 lb Total Weight  Loss (lbs): 30 lb (13.6 kg) Peak Weight: 361 lb   Body Composition  Body Fat %: 42.5 % Fat Mass (lbs): 140.4 lbs Muscle Mass (lbs): 180.8 lbs Total Body Water (lbs): 142.8 lbs Visceral Fat Rating : 28   Other Clinical Data Fasting: No Labs: No Today's Visit #: 5 Starting Date: 08/16/23    Objective:   PHYSICAL EXAM: Blood pressure 119/76, pulse 87, temperature 98.1 F (36.7 C), height 6\' 3"  (1.905 m), weight (!) 330 lb (149.7 kg), SpO2 97%. Body mass index is 41.25 kg/m.  General: he is overweight, cooperative and in no acute distress. PSYCH: Has normal mood, affect and thought process.   HEENT: EOMI, sclerae are anicteric. Lungs: Normal breathing effort, no conversational dyspnea. Extremities: Moves * 4 Neurologic: A and O * 3, good insight  DIAGNOSTIC DATA REVIEWED: BMET    Component Value Date/Time   NA 143 08/26/2023 1244   K 4.6 08/26/2023 1244   CL 105 08/26/2023 1244   CO2 22 08/26/2023 1244   GLUCOSE 79 08/26/2023 1244   GLUCOSE 102 (H) 04/14/2023 0738   BUN 18 08/26/2023 1244   CREATININE 1.02 08/26/2023 1244   CREATININE 0.94 02/13/2022 0841   CALCIUM 9.4 08/26/2023 1244   GFRNONAA 41 (L) 03/07/2022 1614   GFRAA 57 (  L) 09/27/2019 0902   Lab Results  Component Value Date   HGBA1C 5.5 08/26/2023   Lab Results  Component Value Date   INSULIN 11.9 08/26/2023   Lab Results  Component Value Date   TSH 3.630 08/26/2023   CBC    Component Value Date/Time   WBC 8.2 08/26/2023 1244   WBC 9.0 07/24/2023 0803   RBC 5.19 08/26/2023 1244   RBC 4.92 07/24/2023 0803   HGB 15.7 08/26/2023 1244   HCT 47.2 08/26/2023 1244   PLT 274 08/26/2023 1244   MCV 91 08/26/2023 1244   MCH 30.3 08/26/2023 1244   MCH 30.4 03/07/2022 1614   MCHC 33.3 08/26/2023 1244   MCHC 33.4 07/24/2023 0803   RDW 12.7 08/26/2023 1244   Iron Studies    Component Value Date/Time   IRON 91 12/17/2022 0902   TIBC 260.4 12/17/2022 0902   FERRITIN 154 02/14/2021 0812    IRONPCTSAT 34.9 12/17/2022 0902   IRONPCTSAT 33 02/14/2021 0812   Lipid Panel     Component Value Date/Time   CHOL 195 08/26/2023 1244   TRIG 97 08/26/2023 1244   HDL 39 (L) 08/26/2023 1244   CHOLHDL 5 04/14/2023 0738   VLDL 20.2 04/14/2023 0738   LDLCALC 138 (H) 08/26/2023 1244   LDLCALC 96 02/13/2022 0841   Hepatic Function Panel     Component Value Date/Time   PROT 7.6 08/26/2023 1244   ALBUMIN 4.2 08/26/2023 1244   AST 21 08/26/2023 1244   ALT 21 08/26/2023 1244   ALKPHOS 111 08/26/2023 1244   BILITOT 0.6 08/26/2023 1244   BILIDIR 0.0 08/13/2016 0819      Component Value Date/Time   TSH 3.630 08/26/2023 1244   Nutritional Lab Results  Component Value Date   VD25OH 41.2 08/26/2023   VD25OH 31.34 04/14/2023   VD25OH 14.52 (L) 12/09/2022    Attestations:   Burnett Sheng, acting as a medical scribe for Thomasene Lot, DO., have compiled all relevant documentation for today's office visit on behalf of Thomasene Lot, DO, while in the presence of [employer], DO.  Reviewed by clinician on day of visit: allergies, medications, problem list, medical history, surgical history, family history, social history, and previous encounter notes pertinent to patient's obesity diagnosis.  I have spent 53 minutes in the care of the patient today including: preparing to see patient (e.g. review and interpretation of tests, old notes ), obtaining and/or reviewing separately obtained history, performing a medically appropriate examination or evaluation, counseling and educating the patient, ordering medications, test or procedures, documenting clinical information in the electronic or other health care record, and independently interpreting results and communicating results to the patient, family, or caregiver   I have reviewed the above documentation for accuracy and completeness, and I agree with the above. Steve Lynch, D.O.  The 21st Century Cures Act was signed into  law in 2016 which includes the topic of electronic health records.  This provides immediate access to information in MyChart.  This includes consultation notes, operative notes, office notes, lab results and pathology reports.  If you have any questions about what you read please let us know at your next visit so we can discuss your concerns and take corrective action if need be.  We are right here with you.
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
## 2023-12-23 ENCOUNTER — Encounter: Payer: Self-pay | Admitting: Urology

## 2023-12-23 ENCOUNTER — Ambulatory Visit (INDEPENDENT_AMBULATORY_CARE_PROVIDER_SITE_OTHER): Admitting: Urology

## 2023-12-23 VITALS — BP 118/82 | HR 80 | Ht 76.0 in | Wt 325.0 lb

## 2023-12-23 DIAGNOSIS — E291 Testicular hypofunction: Secondary | ICD-10-CM | POA: Diagnosis not present

## 2023-12-23 MED ORDER — TESTOSTERONE 50 MG/5GM (1%) TD GEL: 5.0000 g | Freq: Every day | TRANSDERMAL | 3 refills | Status: DC

## 2023-12-26 ENCOUNTER — Other Ambulatory Visit (INDEPENDENT_AMBULATORY_CARE_PROVIDER_SITE_OTHER): Payer: Self-pay | Admitting: Family Medicine

## 2023-12-31 ENCOUNTER — Ambulatory Visit (INDEPENDENT_AMBULATORY_CARE_PROVIDER_SITE_OTHER): Payer: Medicare Other | Admitting: Nurse Practitioner

## 2023-12-31 ENCOUNTER — Encounter: Payer: Self-pay | Admitting: Nurse Practitioner

## 2023-12-31 VITALS — BP 136/92 | HR 87 | Ht 76.0 in | Wt 329.2 lb

## 2023-12-31 DIAGNOSIS — G4733 Obstructive sleep apnea (adult) (pediatric): Secondary | ICD-10-CM

## 2023-12-31 DIAGNOSIS — Z6841 Body Mass Index (BMI) 40.0 and over, adult: Secondary | ICD-10-CM

## 2023-12-31 NOTE — Patient Instructions (Addendum)
 Continue to use BiPAP every night, minimum of 4-6 hours a night.  Change equipment as directed. Wash your tubing with warm soap and water daily, hang to dry. Wash humidifier portion weekly. Use bottled, distilled water and change daily Be aware of reduced alertness and do not drive or operate heavy machinery if experiencing this or drowsiness.  Exercise encouraged, as tolerated. Healthy weight management discussed.  Avoid or decrease alcohol consumption and medications that make you more sleepy, if possible. Notify if persistent daytime sleepiness occurs even with consistent use of PAP therapy.   Have medical supply company resize your mask    Congrats on the weight loss!    Follow up in 6 months with Dr. Wynona Neat or Philis Nettle. If symptoms do not improve or worsen, please contact office for sooner follow up

## 2023-12-31 NOTE — Progress Notes (Unsigned)
 @Patient  ID: Steve Lynch, male    DOB: [birth year], 68 y.o.   MRN: [id number]  Chief Complaint  Patient presents with   Follow-up    Patient states he is doing well.     Referring provider: [name], MD  HPI: 68 year old male, never smoker followed for OSA on CPAP. He is a patient of Dr. Trena Platt and last seen in office 11/17/2023 by [facility] NP. Past medical history significant for cardiomegaly, aortic regurgitation, essential tremor, DDD, CKD, BPH, HLD, depression, obesity.   TEST/EVENTS:  2018 HST: AHI 63/h 05/13/2023 echo: EF 55-60%, GIDD. RV size and function nl. Normal PASP. AR mild to moderate. Mild dilatation of aortic root.  08/29/2023 BiPAP titration >>optimal pressure 15/11; weight 368 lb   03/19/2022: OV with Dr. Wynona Neat. OSA diagnosed in June 2018. Has been on CPAP auto 5-20 cmH2O. Tolerating CPAP well. Does CPAP about 5 hr 40 min every night. Wakes up rejuvenated. Starts feeling sleepy about 4-5 hours into the day. Sometimes has to nap. Residual AHI 6.1. 92% compliant. Persistent daytime sleepiness - start armodafinil.   08/13/2023: OV with Quincee Gittens NP for follow up. He has a history of severe sleep apnea and is on CPAP. He does not wear it every night. He tends to wear it a few nights and then skip one or two. He did miss about two weeks this past [place] following [employer]. Even with wearing CPAP, he doesn't feel like his daytime fatigue symptoms are any better. He's always tired. Wakes up in the morning around 7 am then he takes a nap just a few hours later. He's very frustrated by his chronic fatigue symptoms. He denies any drowsy driving, sleep parasomnias/paralysis. The armodafinil was not very effective. He did start testosterone injections but hasn't noticed a huge change yet.  He does also feel like he may be depressed. He doesn't want to do much during the day. Just feels like his drive is gone. He enjoys  going fishing still but if he's not at [employer], he usually just sits in his recliner at home. He has had suicidal ideation in the past but this was years ago after a serious injury to his left leg. He went and saw a counselor, which helped tremendously. They retired and he hasn't found anyone since. He has not had any SI/HI since this time.  He is wanting to work on weight loss measures. He's going to medical weight management tomorrow for his first appointment.    11/17/2023: OV with Shifra Swartzentruber NP for follow-up.  After his last visit, he had titration study which showed that he was not well-controlled on CPAP therapy.  He was transitioned to BiPAP with optimal pressure setting of 15/11.  Weight at the time of his study was 368 pounds.  He has received his BiPAP.  He is having difficulties with getting dried out at night. Feels like he wakes up after 4 hours and has a very dry mouth and occasionally some throat irritation.  He is wearing the same mask that he got during his study.  Has not changed out the cushions recently.  Has noticed a little more leakage.  He does not necessarily feel like the pressures are too high or blowing too hard.  Has not tried to adjust his humidity settings.  He does feel like his fatigue symptoms are a little bit better.  He is also been working on [employer]  loss with medical weight management.  This is seem to help a lot with his mood.  He also started Wellbutrin.  He is down about 30 pounds since Thanksgiving. 10/15/2023-11/13/2023 BiPAP 16/10 cmH2O, PS 4 28/30 days; 67% >4 hr; average use 4 hours 48 minutes Pressure 95th IPAP 15.2, EPAP 11.3 Leaks 95th 34.5 AHI 4.1  12/31/2023: Today - follow up Patient presents today for follow-up.  He has been working on weight loss measures.  He is down almost 40 pounds since his BiPAP titration study in November.  Feeling much better with this.  He is still using his BiPAP nightly, which she does feel like he receives benefit from.  No longer  having much issue with dry mouth after we made changes to his pressure settings.  He does feel like he still has leaks at night.  Has not had his mask resized since he had the weight loss.  Fatigue symptoms are better with the weight loss and consistent use of PAP therapy.  Goal is 300 pounds.  Mood is better on Wellbutrin and with weight loss as well.  No issues with drowsy driving, excessive daytime fatigue or sleep parasomnia/paralysis. He has started testosterone gel replacement therapy with his PCP. He's been on it for about 2 weeks. Hasn't noticed a huge change yet.   12/03/2023-01/01/2024 BiPAP 14/8, PS 4 28/30 days; 60% >4 hr (70% over last two weeks with pressure change); average use 4 hr 41 min (improved to 5 hr 32 min with pressure change) Pressure 95th IPAP 13.6, EPAP 9.6 Leaks 95th 27.2 AHI 3.2  Allergies  Allergen Reactions   Oxycodone Rash    Immunization History  Administered Date(s) Administered   PFIZER(Purple Top)SARS-COV-2 Vaccination 01/06/2020, 01/27/2020, 10/12/2020   PNEUMOCOCCAL CONJUGATE-20 04/16/2023   Tdap 02/19/2022   Zoster Recombinant(Shingrix) 02/21/2021, 06/20/2021    Past Medical History:  Diagnosis Date   Crushing injury of left thumb 09/15/2017   Drug abuse in remission (HCC) 1990s   Cocaine with rehab   Dyslipidemia    mild   Fracture of tibia with fibula, closed, right, sequela 10/16/2017   Kidney calculi    OSA (obstructive sleep apnea)    Peripheral vascular disease (HCC) 2004   varicose veins right leg   Tendonitis 10/2010   temporal tendonitis with locked jaw following tooth extraction   Vitamin B12 deficiency 09/2013    Tobacco History: Social History   Tobacco Use  Smoking Status Never  Smokeless Tobacco Never   Counseling given: Not Answered   Outpatient Medications Prior to Visit  Medication Sig Dispense Refill   buPROPion (WELLBUTRIN SR) 150 MG 12 hr tablet Take 1 tablet (150 mg total) by mouth 2 (two) times daily. 60  tablet 1   Cholecalciferol (VITAMIN D) 50 MCG (2000 UT) CAPS Take 1 capsule (2,000 Units total) by mouth daily. 30 capsule    cyanocobalamin (V-R VITAMIN B-12) 500 MCG tablet Take 1 tablet (500 mcg total) by mouth daily.     testosterone (TESTIM) 50 MG/5GM (1%) GEL Place 5 g onto the skin daily. 150 g 3   No facility-administered medications prior to visit.     Review of Systems:   Constitutional: No weight loss, night sweats, fevers, chills,or lassitude. +daytime fatigue (improved), weight loss HEENT: No headaches, difficulty swallowing, tooth/dental problems, or sore throat. No sneezing, itching, ear ache, nasal congestion, or post nasal drip CV:  No chest pain, orthopnea, PND, swelling in lower extremities, anasarca, dizziness, palpitations, syncope Resp: No shortness of breath  with exertion or at rest. No excess mucus or change in color of mucus. No productive or non-productive. No hemoptysis. No wheezing.  No chest wall deformity GI:  No heartburn, indigestion, abdominal pain, nausea, vomiting, diarrhea, change in bowel habits, loss of appetite, bloody stools.  GU: No nocturia Skin: No rash, lesions, ulcerations MSK:  No joint pain or swelling.  Neuro: No dizziness or lightheadedness.  Psych: +depression (improved). No anxiety. No SI/HI. Mood stable.     Physical Exam:  BP (!) 136/92 (BP Location: Left Arm, Patient Position: Sitting, Cuff Size: Normal)   Pulse 87   Ht 6\' 4"  (1.93 m)   Wt (!) 329 lb 3.2 oz (149.3 kg)   SpO2 97%   BMI 40.07 kg/m   GEN: Pleasant, interactive, well-appearing; morbidly obese; in no acute distress HEENT:  Normocephalic and atraumatic. PERRLA. Sclera white. Nasal turbinates pink, moist and patent bilaterally. No rhinorrhea present. Oropharynx pink and moist, without exudate or edema. No lesions, ulcerations, or postnasal drip. Mallampati III/IV NECK:  Supple w/ fair ROM.Thyroid symmetrical with no goiter or nodules palpated. No lymphadenopathy.    CV: RRR, no m/r/g, no peripheral edema. Pulses intact, +2 bilaterally. No cyanosis, pallor or clubbing. PULMONARY:  Unlabored, regular breathing. Clear bilaterally A&P w/o wheezes/rales/rhonchi. No accessory muscle use.  GI: BS present and normoactive. Soft, non-tender to palpation. MSK: No erythema, warmth or tenderness. Cap refil <2 sec all extrem. No deformities or joint swelling noted.  Neuro: A/Ox3. No focal deficits noted.   Skin: Warm, no lesions or rashe Psych: Normal affect and behavior. Judgement and thought content appropriate.     Lab Results:  CBC    Component Value Date/Time   WBC 8.2 08/26/2023 1244   WBC 9.0 07/24/2023 0803   RBC 5.19 08/26/2023 1244   RBC 4.92 07/24/2023 0803   HGB 15.7 08/26/2023 1244   HCT 47.2 08/26/2023 1244   PLT 274 08/26/2023 1244   MCV 91 08/26/2023 1244   MCH 30.3 08/26/2023 1244   MCH 30.4 03/07/2022 1614   MCHC 33.3 08/26/2023 1244   MCHC 33.4 07/24/2023 0803   RDW 12.7 08/26/2023 1244   LYMPHSABS 1.5 08/26/2023 1244   MONOABS 0.7 07/24/2023 0803   EOSABS 0.2 08/26/2023 1244   BASOSABS 0.1 08/26/2023 1244    BMET    Component Value Date/Time   NA 143 08/26/2023 1244   K 4.6 08/26/2023 1244   CL 105 08/26/2023 1244   CO2 22 08/26/2023 1244   GLUCOSE 79 08/26/2023 1244   GLUCOSE 102 (H) 04/14/2023 0738   BUN 18 08/26/2023 1244   CREATININE 1.02 08/26/2023 1244   CREATININE 0.94 02/13/2022 0841   CALCIUM 9.4 08/26/2023 1244   GFRNONAA 41 (L) 03/07/2022 1614   GFRAA 57 (L) 09/27/2019 0902    BNP No results found for: "BNP"   Imaging:  No results found.  Administration History     None           No data to display          No results found for: "NITRICOXIDE"      Assessment & Plan:   OSA on CPAP Severe OSA.  Poorly controlled on CPAP with elevated residual AHI's and significant daytime burden.  Underwent CPAP titration study in November 2024 and was transitioned to BiPAP therapy.  He has had  around a 40 pound weight loss since his sleep study; his goal is 300 lb (70 lb weight loss).  Improved leakage and dryness with  reduced BiPAP settings. He's also had improved usage. Still having some leaks at night, which typically result in him taking the mask off around 4/5 am. He likely needs mask resized - advised him to follow up with DME on this and let us know if he wants mask fitting at [facility]. Encouraged to continue working on healthy weight loss measures. Advised him to monitor for persistent leaks and mask seal.  Aware of risks of untreated sleep apnea.  Plan to repeat sleep study once he is at his goal weight. Safe driving practices reviewed.   Patient Instructions  Continue to use BiPAP every night, minimum of 4-6 hours a night.  Change equipment as directed. Wash your tubing with warm soap and water daily, hang to dry. Wash humidifier portion weekly. Use bottled, distilled water and change daily Be aware of reduced alertness and do not drive or operate heavy machinery if experiencing this or drowsiness.  Exercise encouraged, as tolerated. Healthy weight management discussed.  Avoid or decrease alcohol consumption and medications that make you more sleepy, if possible. Notify if persistent daytime sleepiness occurs even with consistent use of PAP therapy.   Have medical supply company resize your mask    Congrats on the weight loss!    Follow up in 6 months with Dr. Wynona Neat or Philis Nettle. If symptoms do not improve or worsen, please contact office for sooner follow up   Obesity, morbid, BMI 40.0-49.9 (HCC) BMI 40. Healthy weight loss encouraged     I spent 35 minutes of dedicated to the care of this patient on the date of this encounter to include pre-visit review of records, face-to-face time with the patient discussing conditions above, post visit ordering of testing, clinical documentation with the electronic health record, making appropriate referrals as documented, and  communicating necessary findings to members of the patients care team.  Noemi Chapel, NP 01/02/2024  Pt aware and understands NP's role.

## 2024-01-02 ENCOUNTER — Encounter: Payer: Self-pay | Admitting: Nurse Practitioner

## 2024-01-02 NOTE — Assessment & Plan Note (Signed)
 Severe OSA.  Poorly controlled on CPAP with elevated residual AHI's and significant daytime burden.  Underwent CPAP titration study in November 2024 and was transitioned to BiPAP therapy.  He has had around a 40 pound weight loss since his sleep study; his goal is 300 lb (70 lb weight loss).  Improved leakage and dryness with reduced BiPAP settings. He's also had improved usage. Still having some leaks at night, which typically result in him taking the mask off around 4/5 am. He likely needs mask resized - advised him to follow up with DME on this and let us know if he wants mask fitting at [facility]. Encouraged to continue working on healthy weight loss measures. Advised him to monitor for persistent leaks and mask seal.  Aware of risks of untreated sleep apnea.  Plan to repeat sleep study once he is at his goal weight. Safe driving practices reviewed.   Patient Instructions  Continue to use BiPAP every night, minimum of 4-6 hours a night.  Change equipment as directed. Wash your tubing with warm soap and water daily, hang to dry. Wash humidifier portion weekly. Use bottled, distilled water and change daily Be aware of reduced alertness and do not drive or operate heavy machinery if experiencing this or drowsiness.  Exercise encouraged, as tolerated. Healthy weight management discussed.  Avoid or decrease alcohol consumption and medications that make you more sleepy, if possible. Notify if persistent daytime sleepiness occurs even with consistent use of PAP therapy.   Have medical supply company resize your mask    Congrats on the weight loss!    Follow up in 6 months with Dr. Wynona Neat or Philis Nettle. If symptoms do not improve or worsen, please contact office for sooner follow up

## 2024-01-02 NOTE — Assessment & Plan Note (Signed)
 BMI 40. Healthy weight loss encouraged

## 2024-01-07 ENCOUNTER — Encounter (INDEPENDENT_AMBULATORY_CARE_PROVIDER_SITE_OTHER): Payer: Self-pay | Admitting: Family Medicine

## 2024-01-07 ENCOUNTER — Ambulatory Visit (INDEPENDENT_AMBULATORY_CARE_PROVIDER_SITE_OTHER): Payer: Medicare Other | Admitting: Family Medicine

## 2024-01-07 VITALS — BP 108/76 | HR 83 | Temp 97.6°F | Ht 75.0 in | Wt 319.0 lb

## 2024-01-07 DIAGNOSIS — Z6841 Body Mass Index (BMI) 40.0 and over, adult: Secondary | ICD-10-CM

## 2024-01-07 DIAGNOSIS — Z6839 Body mass index (BMI) 39.0-39.9, adult: Secondary | ICD-10-CM

## 2024-01-07 DIAGNOSIS — F5089 Other specified eating disorder: Secondary | ICD-10-CM

## 2024-01-07 DIAGNOSIS — E559 Vitamin D deficiency, unspecified: Secondary | ICD-10-CM

## 2024-01-07 DIAGNOSIS — E88819 Insulin resistance, unspecified: Secondary | ICD-10-CM | POA: Diagnosis not present

## 2024-01-07 MED ORDER — BUPROPION HCL ER (SR) 150 MG PO TB12: 150.0000 mg | ORAL_TABLET | Freq: Two times a day (BID) | ORAL | 0 refills | Status: DC

## 2024-01-07 NOTE — Progress Notes (Signed)
 [name], D.O.  ABFM, ABOM Specializing in Clinical Bariatric Medicine  Office located at: [address] [place], [place]  [address]   Assessment and Plan:  No orders of the defined types were placed in this encounter.   Medications Discontinued During This Encounter  Medication Reason   buPROPion (WELLBUTRIN SR) 150 MG 12 hr tablet Reorder     Meds ordered this encounter  Medications   buPROPion (WELLBUTRIN SR) 150 MG 12 hr tablet    Sig: Take 1 tablet (150 mg total) by mouth 2 (two) times daily.    Dispense:  60 tablet    Refill:  0     Will obtain fasting labs during next OV.  FOR THE DISEASE OF OBESITY:  BMI 40.0-44.9, adult (HCC) - Current BMI 39.87 Obesity, morbid, BMI 40.0-49.9 (HCC) Assessment & Plan: Since last office visit on 12/03/2023, patient's muscle mass has decreased by 0.4 lbs. Fat mass has decreased by 10.8 lbs. Total body water has decreased by 7 lbs.  Counseling done on how various foods will affect these numbers and how to maximize success  Total lbs lost to date: 41 lbs Total weight loss percentage to date: 11.63%    Recommended Dietary Goals Matai is currently in the action stage of change. As such, his goal is to continue weight management plan.  He has agreed to: continue current plan   Behavioral Intervention We discussed the following today: decreasing simple carbohydrates , increasing lower glycemic fruits, and continue to work on implementation of reduced calorie nutritional plan  Additional resources provided today: None  Evidence-based interventions for health behavior change were utilized today including the discussion of self monitoring techniques, problem-solving barriers and SMART goal setting techniques.   Regarding patient's less desirable eating habits and patterns, we employed the technique of small changes.   Pt will specifically work on: n/a   Recommended Physical Activity Goals Oleg has been advised to  work up to 150 minutes of moderate intensity aerobic activity a week and strengthening exercises 2-3 times per week for cardiovascular health, weight loss maintenance and preservation of muscle mass.   He has agreed to : Go to the gym at least 2 days a week for 60 minutes   Pharmacotherapy We both agreed to : continue with nutritional and behavioral strategies and current medication regimen   FOR ASSOCIATED CONDITIONS ADDRESSED TODAY:  Insulin resistance Assessment & Plan: Willie is not taking any medications for this condition. Diet/exercise approach. Hunger/cravings well controlled. Chanc will continue to work on weight loss, exercise, via their meal plan we devised to help decrease the risk of progressing to Pre-diabetes. Will recheck A1c and fasting insulin during next OV.    Other disorder of eating - emotional eating Assessment & Plan: Sanjuan is taking Wellbutrin SR 150 mg twice daily. During LOV, dose was increased from 100 mg. Pt is tolerating new dose well, denies any adverse SE. Pt is no longer experiencing cravings, however has increased emotional feeling. Encouraged pt to explore effective ways to express emotions. Continue current medication regimen. Will continue to monitor closely.   Relevant Orders: -     buPROPion HCl ER (SR); Take 1 tablet (150 mg total) by mouth 2 (two) times daily.  Dispense: 60 tablet; Refill: 0   Vitamin D deficiency Assessment & Plan: Pt is taking Cholecalciferol 50 mcg once daily. Tolerating well, no SE. No acute concerns today. Will continue to monitor.   Follow up:   Return in 5  weeks (on 02/11/2024) for no caffeine, no exercise, no sex prior to appt. He was informed of the importance of frequent follow up visits to maximize his success with intensive lifestyle modifications for his multiple health conditions.  Subjective:   Chief complaint: Obesity Gotti is here to discuss his progress with his obesity treatment plan. He is on the the  Category 4 Plan and states he is following his eating plan approximately 75% of the time. He states he is going to [facility] 60 minutes 1 days per week.  Interval History:  Cleatus Gabriel Orlich is here for a follow up office visit. Since last OV on 12/03/2023, Shadee is down 11 lbs. He has not been journaling, but following MP well. He admits to eating two donuts recently, but has cut out sugar free sodas in diet. Additionally, pt reports his evenings are very busy which makes it difficult to eat at a reasonable time sometimes.   Pharmacotherapy for weight loss: He is currently taking Wellbutrin SR 150 mg twice daily.   Review of Systems:  Pertinent positives were addressed with patient today.  Reviewed by clinician on day of visit: allergies, medications, problem list, medical history, surgical history, family history, social history, and previous encounter notes.  Weight Summary and Biometrics   Weight Lost Since Last Visit: 11lb  Weight Gained Since Last Visit: 0   Vitals Temp: 97.6 F (36.4 C) BP: 108/76 Pulse Rate: 83 SpO2: 97 %   Anthropometric Measurements Height: 6\' 3"  (1.905 m) Weight: (!) 319 lb (144.7 kg) BMI (Calculated): 39.87 Weight at Last Visit: 330lb Weight Lost Since Last Visit: 11lb Weight Gained Since Last Visit: 0 Starting Weight: 361lb Total Weight Loss (lbs): 41 lb (18.6 kg) Peak Weight: 361lb   Body Composition  Body Fat %: 40.6 % Fat Mass (lbs): 129.6 lbs Muscle Mass (lbs): 180.4 lbs Total Body Water (lbs): 135.8 lbs Visceral Fat Rating : 26   Other Clinical Data Fasting: no Labs: yes Today's Visit #: 6 Starting Date: 08/16/23    Objective:   PHYSICAL EXAM: Blood pressure 108/76, pulse 83, temperature 97.6 F (36.4 C), height 6\' 3"  (1.905 m), weight (!) 319 lb (144.7 kg), SpO2 97%. Body mass index is 39.87 kg/m.  General: he is overweight, cooperative and in no acute distress. PSYCH: Has normal mood, affect and thought process.   HEENT:  EOMI, sclerae are anicteric. Lungs: Normal breathing effort, no conversational dyspnea. Extremities: Moves * 4 Neurologic: A and O * 3, good insight  DIAGNOSTIC DATA REVIEWED: BMET    Component Value Date/Time   NA 143 08/26/2023 1244   K 4.6 08/26/2023 1244   CL 105 08/26/2023 1244   CO2 22 08/26/2023 1244   GLUCOSE 79 08/26/2023 1244   GLUCOSE 102 (H) 04/14/2023 0738   BUN 18 08/26/2023 1244   CREATININE 1.02 08/26/2023 1244   CREATININE 0.94 02/13/2022 0841   CALCIUM 9.4 08/26/2023 1244   GFRNONAA 41 (L) 03/07/2022 1614   GFRAA 57 (L) 09/27/2019 0902   Lab Results  Component Value Date   HGBA1C 5.5 08/26/2023   Lab Results  Component Value Date   INSULIN 11.9 08/26/2023   Lab Results  Component Value Date   TSH 3.630 08/26/2023   CBC    Component Value Date/Time   WBC 8.2 08/26/2023 1244   WBC 9.0 07/24/2023 0803   RBC 5.19 08/26/2023 1244   RBC 4.92 07/24/2023 0803   HGB 15.7 08/26/2023 1244   HCT 47.2 08/26/2023 1244  PLT 274 08/26/2023 1244   MCV 91 08/26/2023 1244   MCH 30.3 08/26/2023 1244   MCH 30.4 03/07/2022 1614   MCHC 33.3 08/26/2023 1244   MCHC 33.4 07/24/2023 0803   RDW 12.7 08/26/2023 1244   Iron Studies    Component Value Date/Time   IRON 91 12/17/2022 0902   TIBC 260.4 12/17/2022 0902   FERRITIN 154 02/14/2021 0812   IRONPCTSAT 34.9 12/17/2022 0902   IRONPCTSAT 33 02/14/2021 0812   Lipid Panel     Component Value Date/Time   CHOL 195 08/26/2023 1244   TRIG 97 08/26/2023 1244   HDL 39 (L) 08/26/2023 1244   CHOLHDL 5 04/14/2023 0738   VLDL 20.2 04/14/2023 0738   LDLCALC 138 (H) 08/26/2023 1244   LDLCALC 96 02/13/2022 0841   Hepatic Function Panel     Component Value Date/Time   PROT 7.6 08/26/2023 1244   ALBUMIN 4.2 08/26/2023 1244   AST 21 08/26/2023 1244   ALT 21 08/26/2023 1244   ALKPHOS 111 08/26/2023 1244   BILITOT 0.6 08/26/2023 1244   BILIDIR 0.0 08/13/2016 0819      Component Value Date/Time   TSH 3.630  08/26/2023 1244   Nutritional Lab Results  Component Value Date   VD25OH 41.2 08/26/2023   VD25OH 31.34 04/14/2023   VD25OH 14.52 (L) 12/09/2022    Attestations:   I, Camryn Mix, acting as a Stage manager for [employer], DO., have compiled all relevant documentation for today's office visit on behalf of Thomasene Lot, DO, while in the presence of [employer], DO.  I have reviewed the above documentation for accuracy and completeness, and I agree with the above. [name], D.O.  The 21st Century Cures Act was signed into law in 2016 which includes the topic of electronic health records.  This provides immediate access to information in MyChart.  This includes consultation notes, operative notes, office notes, lab results and pathology reports.  If you have any questions about what you read please let us know at your next visit so we can discuss your concerns and take corrective action if need be.  We are right here with you.

## 2024-01-14 ENCOUNTER — Encounter (INDEPENDENT_AMBULATORY_CARE_PROVIDER_SITE_OTHER): Payer: Self-pay | Admitting: Family Medicine

## 2024-01-16 DIAGNOSIS — H2513 Age-related nuclear cataract, bilateral: Secondary | ICD-10-CM | POA: Diagnosis not present

## 2024-01-16 DIAGNOSIS — H0259 Other disorders affecting eyelid function: Secondary | ICD-10-CM | POA: Diagnosis not present

## 2024-01-28 ENCOUNTER — Telehealth: Payer: Self-pay

## 2024-01-28 DIAGNOSIS — G4733 Obstructive sleep apnea (adult) (pediatric): Secondary | ICD-10-CM

## 2024-01-28 NOTE — Telephone Encounter (Signed)
 Copied from CRM [id number]. Topic: Clinical - Prescription Issue >> Jan 28, 2024  2:19 PM Isabell A wrote: Reason for CRM: Bynum Cassis from [facility] states they're needing a signed script for patients BIPAP supplies.  Fax no: [phone]  ORDER SENT. NFN

## 2024-02-11 ENCOUNTER — Encounter (INDEPENDENT_AMBULATORY_CARE_PROVIDER_SITE_OTHER): Payer: Self-pay | Admitting: Family Medicine

## 2024-02-11 ENCOUNTER — Ambulatory Visit (INDEPENDENT_AMBULATORY_CARE_PROVIDER_SITE_OTHER): Admitting: Family Medicine

## 2024-02-11 VITALS — BP 133/84 | HR 67 | Temp 98.8°F | Ht 75.0 in | Wt 321.0 lb

## 2024-02-11 DIAGNOSIS — E559 Vitamin D deficiency, unspecified: Secondary | ICD-10-CM | POA: Diagnosis not present

## 2024-02-11 DIAGNOSIS — E538 Deficiency of other specified B group vitamins: Secondary | ICD-10-CM

## 2024-02-11 DIAGNOSIS — E88819 Insulin resistance, unspecified: Secondary | ICD-10-CM | POA: Diagnosis not present

## 2024-02-11 DIAGNOSIS — E669 Obesity, unspecified: Secondary | ICD-10-CM

## 2024-02-11 DIAGNOSIS — E782 Mixed hyperlipidemia: Secondary | ICD-10-CM | POA: Diagnosis not present

## 2024-02-11 DIAGNOSIS — Z6841 Body Mass Index (BMI) 40.0 and over, adult: Secondary | ICD-10-CM

## 2024-02-11 DIAGNOSIS — F5089 Other specified eating disorder: Secondary | ICD-10-CM

## 2024-02-11 MED ORDER — BUPROPION HCL ER (SR) 150 MG PO TB12: 150.0000 mg | ORAL_TABLET | Freq: Two times a day (BID) | ORAL | 0 refills | Status: DC

## 2024-02-11 NOTE — Progress Notes (Signed)
 Steve Lynch, D.O.  ABFM, ABOM Specializing in Clinical Bariatric Medicine  Office located at: [address] [place], [place]  [address]   Assessment and Plan:   Orders Placed This Encounter  Procedures   Vitamin B12   VITAMIN D  25 Hydroxy (Vit-D Deficiency, Fractures)   Hemoglobin A1c   Basic metabolic panel with GFR   Insulin , random   Lipid panel   Medications Discontinued During This Encounter  Medication Reason   buPROPion  (WELLBUTRIN  SR) 150 MG 12 hr tablet Reorder    Meds ordered this encounter  Medications   buPROPion  (WELLBUTRIN  SR) 150 MG 12 hr tablet    Sig: Take 1 tablet (150 mg total) by mouth 2 (two) times daily.    Dispense:  180 tablet    Refill:  0     FOR THE DISEASE OF OBESITY:  BMI 40.0-44.9, adult (HCC) - Current BMI 40.12 Obesity, morbid, BMI 40.0-49.9 (HCC) - START 42.8 Assessment & Plan: Since last office visit on 01/07/2024, patient's muscle mass has decreased by 0.2 lbs. Fat mass has increased by 2 lbs. Total body water has increased by 6.6 lbs.  Counseling done on how various foods will affect these numbers and how to maximize success  Total lbs lost to date: 40 lbs Total weight loss percentage to date: 11.08%    Recommended Dietary Goals Steve Lynch is currently in the action stage of change. As such, his goal is to continue weight management plan.  He has agreed to: continue current plan   Behavioral Intervention We discussed the following today: practice mindfulness eating and understand the difference between hunger signals and cravings  Additional resources provided today: None  Evidence-based interventions for health behavior change were utilized today including the discussion of self monitoring techniques, problem-solving barriers and SMART goal setting techniques.   Regarding patient's less desirable eating habits and patterns, we employed the technique of small changes.   Pt will specifically work on:  n/a   Recommended Physical Activity Goals Steve Lynch has been advised to work up to 300-450 minutes of moderate intensity aerobic activity a week and strengthening exercises 2-3 times per week for cardiovascular health, weight loss maintenance and preservation of muscle mass.   He has agreed to :  Think about enjoyable ways to increase daily physical activity and overcoming barriers to exercise   Pharmacotherapy We both agreed to : continue with nutritional and behavioral strategies   FOR ASSOCIATED CONDITIONS ADDRESSED TODAY:  Insulin  resistance Assessment & Plan: Steve Lynch is not taking any IR medications. Diet/exercise approach. Hunger well controlled, cravings poorly controlled. Discussed potentially starting medication to decrease cravings throughout the day. Pt prefers to focus on weight loss efforts through proper food habits and regular exercise. Continue following prudent nutrition plan. Will continue to monitor.   Relevant Orders: -     Hemoglobin A1c -     Basic metabolic panel with GFR -     Insulin , random   Mixed hyperlipidemia Assessment & Plan: Lab Results  Component Value Date   CHOL 195 08/26/2023   HDL 39 (L) 08/26/2023   LDLCALC 138 (H) 08/26/2023   TRIG 97 08/26/2023   CHOLHDL 5 04/14/2023  Steve Lynch is not on any cholesterol medications. His last lipid panel shows an elevated LDL and low HDL. Cardiovascular risk and specific lipid/LDL goals reviewed. We extensively discussed several lifestyle modifications today and Steve Lynch will continue to work on diet, exercise and weight loss efforts. Will obtain lipid panel today to observe any  changes.  Relevant Orders: -     Lipid panel   Vitamin D  deficiency Assessment & Plan: Steve Lynch is on Cholecalciferol 2000 units daily. He reports full compliance with supplement, denies any N/V or muscle weakness. No concerns today in this regard. Will recheck levels today.   Relevant Orders: -     VITAMIN D  25 Hydroxy (Vit-D Deficiency,  Fractures)   Vitamin B12 deficiency Assessment & Plan: Pt is compliant with Cyanocobalamin  500 mcg daily. No adverse SE. Continue supplement at current dose. Will recheck today.   Relevant Orders: -     Vitamin B12   Other disorder of eating - emotional eating Assessment & Plan: Steve Lynch is taking Wellbutrin  SR 150 mg twice daily. This dose was increased on 02/19, pt denies any concerns with medication -- denies any SE. Mood stable, Steve Lynch endorses medication is "leveling out" emotions. He reports going through recent stressors and eating additional sweets at work. As stress management, pt is praying. Informed pt that an increase in protein can decrease emotional eating. Reminded patient of the importance of following their prudent nutrition plan and how food can affect mood as well to support emotional wellbeing.  Recommended 4-7-8 breathing, meditation, and continue prayer.   Relevant Orders: -     buPROPion  HCl ER (SR); Take 1 tablet (150 mg total) by mouth 2 (two) times daily.  Dispense: 180 tablet; Refill: 0  Follow up:   Return in about 3 weeks (around 03/03/2024). He was informed of the importance of frequent follow up visits to maximize his success with intensive lifestyle modifications for his multiple health conditions.  Subjective:   Chief complaint: Obesity Steve Lynch is here to discuss his progress with his obesity treatment plan. He is on the the Category 4 Plan and states he is following his eating plan approximately 50% of the time. He states he is walking 40 minutes 2 days per week.  Interval History:  Steve Lynch is here for a follow up office visit. Since last OV on 01/07/2024, pt is up 2 lbs. He has been stressed for the last couple weeks and eating extra snacks, such as cookies. Pt recently sold his house and is working on building a new house. Additionally, Steve Lynch is eating yogurt with fruit or granola every morning.   Pharmacotherapy for weight loss: He is currently taking  Wellbutrin  SR 150 mg twice daily.   Review of Systems:  Pertinent positives were addressed with patient today.  Reviewed by clinician on day of visit: allergies, medications, problem list, medical history, surgical history, family history, social history, and previous encounter notes.  Weight Summary and Biometrics   Weight Lost Since Last Visit: 0lb  Weight Gained Since Last Visit: 2lb   Vitals Temp: 98.8 F (37.1 C) BP: 133/84 Pulse Rate: 67 SpO2: 98 %   Anthropometric Measurements Height: 6\' 3"  (1.905 m) Weight: (!) 321 lb (145.6 kg) BMI (Calculated): 40.12 Weight at Last Visit: 319lb Weight Lost Since Last Visit: 0lb Weight Gained Since Last Visit: 2lb Starting Weight: 361lb Total Weight Loss (lbs): 39 lb (17.7 kg) Peak Weight: 361lb   Body Composition  Body Fat %: 41 % Fat Mass (lbs): 131.6 lbs Muscle Mass (lbs): 180.2 lbs Total Body Water (lbs): 142.4 lbs Visceral Fat Rating : 27   Other Clinical Data Fasting: Yes Labs: Yes Today's Visit #: 7 Starting Date: 08/16/23    Objective:   PHYSICAL EXAM: Blood pressure 133/84, pulse 67, temperature 98.8 F (37.1 C), height 6\' 3"  (1.905  m), weight (!) 321 lb (145.6 kg), SpO2 98%. Body mass index is 40.12 kg/m.  General: he is overweight, cooperative and in no acute distress. PSYCH: Has normal mood, affect and thought process.   HEENT: EOMI, sclerae are anicteric. Lungs: Normal breathing effort, no conversational dyspnea. Extremities: Moves * 4 Neurologic: A and O * 3, good insight  DIAGNOSTIC DATA REVIEWED: BMET    Component Value Date/Time   NA 143 08/26/2023 1244   K 4.6 08/26/2023 1244   CL 105 08/26/2023 1244   CO2 22 08/26/2023 1244   GLUCOSE 79 08/26/2023 1244   GLUCOSE 102 (H) 04/14/2023 0738   BUN 18 08/26/2023 1244   CREATININE 1.02 08/26/2023 1244   CREATININE 0.94 02/13/2022 0841   CALCIUM 9.4 08/26/2023 1244   GFRNONAA 41 (L) 03/07/2022 1614   GFRAA 57 (L) 09/27/2019 0902    Lab Results  Component Value Date   HGBA1C 5.5 08/26/2023   Lab Results  Component Value Date   INSULIN  11.9 08/26/2023   Lab Results  Component Value Date   TSH 3.630 08/26/2023   CBC    Component Value Date/Time   WBC 8.2 08/26/2023 1244   WBC 9.0 07/24/2023 0803   RBC 5.19 08/26/2023 1244   RBC 4.92 07/24/2023 0803   HGB 15.7 08/26/2023 1244   HCT 47.2 08/26/2023 1244   PLT 274 08/26/2023 1244   MCV 91 08/26/2023 1244   MCH 30.3 08/26/2023 1244   MCH 30.4 03/07/2022 1614   MCHC 33.3 08/26/2023 1244   MCHC 33.4 07/24/2023 0803   RDW 12.7 08/26/2023 1244   Iron Studies    Component Value Date/Time   IRON 91 12/17/2022 0902   TIBC 260.4 12/17/2022 0902   FERRITIN 154 02/14/2021 0812   IRONPCTSAT 34.9 12/17/2022 0902   IRONPCTSAT 33 02/14/2021 0812   Lipid Panel     Component Value Date/Time   CHOL 195 08/26/2023 1244   TRIG 97 08/26/2023 1244   HDL 39 (L) 08/26/2023 1244   CHOLHDL 5 04/14/2023 0738   VLDL 20.2 04/14/2023 0738   LDLCALC 138 (H) 08/26/2023 1244   LDLCALC 96 02/13/2022 0841   Hepatic Function Panel     Component Value Date/Time   PROT 7.6 08/26/2023 1244   ALBUMIN 4.2 08/26/2023 1244   AST 21 08/26/2023 1244   ALT 21 08/26/2023 1244   ALKPHOS 111 08/26/2023 1244   BILITOT 0.6 08/26/2023 1244   BILIDIR 0.0 08/13/2016 0819      Component Value Date/Time   TSH 3.630 08/26/2023 1244   Nutritional Lab Results  Component Value Date   VD25OH 41.2 08/26/2023   VD25OH 31.34 04/14/2023   VD25OH 14.52 (L) 12/09/2022    Attestations:   I, Camryn Mix, acting as a Stage manager for [employer], DO., have compiled all relevant documentation for today's office visit on behalf of Marceil Sensor, DO, while in the presence of [employer], DO.  I have reviewed the above documentation for accuracy and completeness, and I agree with the above. Steve Lynch, D.O.  The 21st Century Cures Act was signed into law in 2016 which  includes the topic of electronic health records.  This provides immediate access to information in MyChart.  This includes consultation notes, operative notes, office notes, lab results and pathology reports.  If you have any questions about what you read please let us  know at your next visit so we can discuss your concerns and take corrective action if need be.  We  are right here with you.

## 2024-02-12 LAB — LIPID PANEL
Chol/HDL Ratio: 4.1 ratio (ref 0.0–5.0)
Cholesterol, Total: 150 mg/dL (ref 100–199)
HDL: 37 mg/dL — ABNORMAL LOW (ref >39)
LDL Chol Calc (NIH): 101 mg/dL — ABNORMAL HIGH (ref 0–99)
Triglycerides: 59 mg/dL (ref 0–149)
VLDL Cholesterol Cal: 12 mg/dL (ref 5–40)

## 2024-02-12 LAB — BASIC METABOLIC PANEL WITH GFR
BUN/Creatinine Ratio: 16 (ref 10–24)
BUN: 17 mg/dL (ref 8–27)
CO2: 22 mmol/L (ref 20–29)
Calcium: 9 mg/dL (ref 8.6–10.2)
Chloride: 106 mmol/L (ref 96–106)
Creatinine, Ser: 1.06 mg/dL (ref 0.76–1.27)
Glucose: 66 mg/dL — ABNORMAL LOW (ref 70–99)
Potassium: 4.1 mmol/L (ref 3.5–5.2)
Sodium: 142 mmol/L (ref 134–144)
eGFR: 77 mL/min/{1.73_m2} (ref >59)

## 2024-02-12 LAB — VITAMIN B12: Vitamin B-12: >2000 pg/mL — ABNORMAL HIGH (ref 232–1245)

## 2024-02-12 LAB — HEMOGLOBIN A1C
Est. average glucose Bld gHb Est-mCnc: 100 mg/dL
Hgb A1c MFr Bld: 5.1 % (ref 4.8–5.6)

## 2024-02-12 LAB — INSULIN, RANDOM: INSULIN: 6.4 u[IU]/mL (ref 2.6–24.9)

## 2024-02-12 LAB — VITAMIN D 25 HYDROXY (VIT D DEFICIENCY, FRACTURES): Vit D, 25-Hydroxy: 52.8 ng/mL (ref 30.0–100.0)

## 2024-02-19 ENCOUNTER — Other Ambulatory Visit: Payer: Self-pay | Admitting: Family Medicine

## 2024-02-20 ENCOUNTER — Other Ambulatory Visit: Payer: Self-pay | Admitting: Urology

## 2024-02-20 MED ORDER — TESTOSTERONE 20.25 MG/ACT (1.62%) TD GEL: TRANSDERMAL | 1 refills | Status: DC

## 2024-03-03 ENCOUNTER — Ambulatory Visit (INDEPENDENT_AMBULATORY_CARE_PROVIDER_SITE_OTHER): Admitting: Family Medicine

## 2024-03-16 ENCOUNTER — Telehealth: Payer: Self-pay | Admitting: Family Medicine

## 2024-03-16 NOTE — Telephone Encounter (Signed)
 Dr. Mariam Shingles,  Patient is requesting lab work for you appointment on 05/05/2024. Can you put in the orders? The patient is schedule for labs on 04/28/2024.  Thanks, [name] Care Guide [facility] AWV TEAM Direct Dial: [phone]

## 2024-03-17 NOTE — Telephone Encounter (Signed)
 Will order closer to the date.

## 2024-04-01 ENCOUNTER — Ambulatory Visit (INDEPENDENT_AMBULATORY_CARE_PROVIDER_SITE_OTHER): Admitting: Family Medicine

## 2024-04-01 ENCOUNTER — Encounter (INDEPENDENT_AMBULATORY_CARE_PROVIDER_SITE_OTHER): Payer: Self-pay

## 2024-04-05 ENCOUNTER — Ambulatory Visit: Admitting: Family Medicine

## 2024-04-20 ENCOUNTER — Ambulatory Visit (INDEPENDENT_AMBULATORY_CARE_PROVIDER_SITE_OTHER)

## 2024-04-20 VITALS — BP 133/64 | Ht 76.0 in | Wt 320.0 lb

## 2024-04-20 DIAGNOSIS — Z2821 Immunization not carried out because of patient refusal: Secondary | ICD-10-CM

## 2024-04-20 DIAGNOSIS — Z0000 Encounter for general adult medical examination without abnormal findings: Secondary | ICD-10-CM

## 2024-04-20 NOTE — Patient Instructions (Signed)
 Steve Lynch , Thank you for taking time out of your busy schedule to complete your Annual Wellness Visit with me. I enjoyed our conversation and look forward to speaking with you again next year. I, as well as your care team,  appreciate your ongoing commitment to your health goals. Please review the following plan we discussed and let me know if I can assist you in the future. Your Game plan/ To Do List    Referrals: If you haven't heard from the office you've been referred to, please reach out to them at the phone provided.  none Follow up Visits: Next Medicare AWV with our clinical staff: 04/21/2025   Have you seen your provider in the last 6 months (3 months if uncontrolled diabetes)? Yes Next Office Visit with your provider: 05/05/2024  Clinician Recommendations:  Aim for 30 minutes of exercise or brisk walking, 6-8 glasses of water, and 5 servings of fruits and vegetables each day.       This is a list of the screening recommended for you and due dates:  Health Maintenance  Topic Date Due   COVID-19 Vaccine (4 - 2024-25 season) 06/15/2023   Flu Shot  05/14/2024   Medicare Annual Wellness Visit  04/20/2025   Colon Cancer Screening  11/02/2030   DTaP/Tdap/Td vaccine (2 - Td or Tdap) 02/20/2032   Pneumococcal Vaccine for age over 32  Completed   Hepatitis C Screening  Completed   Zoster (Shingles) Vaccine  Completed   Hepatitis B Vaccine  Aged Out   HPV Vaccine  Aged Out   Meningitis B Vaccine  Aged Out    Advanced directives: (Declined) Advance directive discussed with you today. Even though you declined this today, please [place] our office should you change your mind, and we can give you the proper paperwork for you to fill out. Advance Care Planning is important because it:  [x]  Makes sure you receive the medical care that is consistent with your values, goals, and preferences  [x]  It provides guidance to your family and loved ones and reduces their decisional burden about whether  or not they are making the right decisions based on your wishes.  Follow the link provided in your after visit summary or read over the paperwork we have mailed to you to help you started getting your Advance Directives in place. If you need assistance in completing these, please reach out to us  so that we can help you!  See attachments for Preventive Care and Fall Prevention Tips.
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
## 2024-04-25 ENCOUNTER — Other Ambulatory Visit: Payer: Self-pay | Admitting: Family Medicine

## 2024-04-25 DIAGNOSIS — N401 Enlarged prostate with lower urinary tract symptoms: Secondary | ICD-10-CM

## 2024-04-25 DIAGNOSIS — N1831 Chronic kidney disease, stage 3a: Secondary | ICD-10-CM

## 2024-04-25 DIAGNOSIS — E538 Deficiency of other specified B group vitamins: Secondary | ICD-10-CM

## 2024-04-25 DIAGNOSIS — E785 Hyperlipidemia, unspecified: Secondary | ICD-10-CM

## 2024-04-25 DIAGNOSIS — E559 Vitamin D deficiency, unspecified: Secondary | ICD-10-CM

## 2024-04-27 ENCOUNTER — Other Ambulatory Visit: Payer: Self-pay | Admitting: Urology

## 2024-04-27 DIAGNOSIS — M2042 Other hammer toe(s) (acquired), left foot: Secondary | ICD-10-CM | POA: Diagnosis not present

## 2024-04-27 DIAGNOSIS — L84 Corns and callosities: Secondary | ICD-10-CM | POA: Diagnosis not present

## 2024-04-27 DIAGNOSIS — M2041 Other hammer toe(s) (acquired), right foot: Secondary | ICD-10-CM | POA: Diagnosis not present

## 2024-04-27 DIAGNOSIS — M79672 Pain in left foot: Secondary | ICD-10-CM | POA: Diagnosis not present

## 2024-04-27 DIAGNOSIS — E291 Testicular hypofunction: Secondary | ICD-10-CM

## 2024-04-27 DIAGNOSIS — M216X2 Other acquired deformities of left foot: Secondary | ICD-10-CM | POA: Diagnosis not present

## 2024-04-28 ENCOUNTER — Other Ambulatory Visit

## 2024-04-28 ENCOUNTER — Other Ambulatory Visit (INDEPENDENT_AMBULATORY_CARE_PROVIDER_SITE_OTHER)

## 2024-04-28 ENCOUNTER — Telehealth: Payer: Self-pay | Admitting: Urology

## 2024-04-28 DIAGNOSIS — N1831 Chronic kidney disease, stage 3a: Secondary | ICD-10-CM | POA: Diagnosis not present

## 2024-04-28 DIAGNOSIS — E538 Deficiency of other specified B group vitamins: Secondary | ICD-10-CM | POA: Diagnosis not present

## 2024-04-28 DIAGNOSIS — E785 Hyperlipidemia, unspecified: Secondary | ICD-10-CM | POA: Diagnosis not present

## 2024-04-28 DIAGNOSIS — N401 Enlarged prostate with lower urinary tract symptoms: Secondary | ICD-10-CM

## 2024-04-28 DIAGNOSIS — R351 Nocturia: Secondary | ICD-10-CM | POA: Diagnosis not present

## 2024-04-28 LAB — COMPREHENSIVE METABOLIC PANEL WITH GFR
ALT: 12 U/L (ref 0–53)
AST: 13 U/L (ref 0–37)
Albumin: 4 g/dL (ref 3.5–5.2)
Alkaline Phosphatase: 84 U/L (ref 39–117)
BUN: 20 mg/dL (ref 6–23)
CO2: 27 meq/L (ref 19–32)
Calcium: 9 mg/dL (ref 8.4–10.5)
Chloride: 108 meq/L (ref 96–112)
Creatinine, Ser: 1.04 mg/dL (ref 0.40–1.50)
GFR: 74.82 mL/min (ref >60.00)
Glucose, Bld: 99 mg/dL (ref 70–99)
Potassium: 4.4 meq/L (ref 3.5–5.1)
Sodium: 141 meq/L (ref 135–145)
Total Bilirubin: 0.7 mg/dL (ref 0.2–1.2)
Total Protein: 7.2 g/dL (ref 6.0–8.3)

## 2024-04-28 LAB — CBC WITH DIFFERENTIAL/PLATELET
Basophils Absolute: 0.1 K/uL (ref 0.0–0.1)
Basophils Relative: 0.7 % (ref 0.0–3.0)
Eosinophils Absolute: 0.1 K/uL (ref 0.0–0.7)
Eosinophils Relative: 1.3 % (ref 0.0–5.0)
HCT: 44.3 % (ref 39.0–52.0)
Hemoglobin: 15 g/dL (ref 13.0–17.0)
Lymphocytes Relative: 14.3 % (ref 12.0–46.0)
Lymphs Abs: 1.2 K/uL (ref 0.7–4.0)
MCHC: 33.9 g/dL (ref 30.0–36.0)
MCV: 90.4 fl (ref 78.0–100.0)
Monocytes Absolute: 0.4 K/uL (ref 0.1–1.0)
Monocytes Relative: 5.3 % (ref 3.0–12.0)
Neutro Abs: 6.4 K/uL (ref 1.4–7.7)
Neutrophils Relative %: 78.4 % — ABNORMAL HIGH (ref 43.0–77.0)
Platelets: 267 K/uL (ref 150.0–400.0)
RBC: 4.9 Mil/uL (ref 4.22–5.81)
RDW: 12.9 % (ref 11.5–15.5)
WBC: 8.2 K/uL (ref 4.0–10.5)

## 2024-04-28 LAB — LIPID PANEL
Cholesterol: 148 mg/dL (ref 0–200)
HDL: 36.7 mg/dL — ABNORMAL LOW (ref >39.00)
LDL Cholesterol: 100 mg/dL — ABNORMAL HIGH (ref 0–99)
NonHDL: 111.56
Total CHOL/HDL Ratio: 4
Triglycerides: 59 mg/dL (ref 0.0–149.0)
VLDL: 11.8 mg/dL (ref 0.0–40.0)

## 2024-04-28 LAB — VITAMIN D 25 HYDROXY (VIT D DEFICIENCY, FRACTURES): VITD: 42.29 ng/mL (ref 30.00–100.00)

## 2024-04-28 LAB — MICROALBUMIN / CREATININE URINE RATIO
Creatinine,U: 134 mg/dL
Microalb Creat Ratio: UNDETERMINED mg/g (ref 0.0–30.0)
Microalb, Ur: <0.7 mg/dL

## 2024-04-28 LAB — PSA: PSA: 3.77 ng/mL (ref 0.10–4.00)

## 2024-04-28 LAB — VITAMIN B12: Vitamin B-12: 596 pg/mL (ref 211–911)

## 2024-04-28 LAB — PHOSPHORUS: Phosphorus: 2.5 mg/dL (ref 2.3–4.6)

## 2024-04-28 NOTE — Telephone Encounter (Signed)
 Called patient and no answer left a message for him to [place] back

## 2024-04-28 NOTE — Telephone Encounter (Signed)
 I had a request from the pharmacy for a refill on his testosterone  gel.  I did not refill the medication because when I saw Steve Lynch in March, we agreed that he would get his testosterone , hemoglobin, hematocrit and PSA drawn in May with his PCP.  I did not receive any of those results and therefore I cannot refill the medication until I have those labs completed.

## 2024-04-29 ENCOUNTER — Ambulatory Visit: Payer: Self-pay | Admitting: Family Medicine

## 2024-04-29 ENCOUNTER — Other Ambulatory Visit: Payer: Self-pay

## 2024-04-29 DIAGNOSIS — E291 Testicular hypofunction: Secondary | ICD-10-CM

## 2024-04-29 LAB — PARATHYROID HORMONE, INTACT (NO CA): PTH: 36 pg/mL (ref 16–77)

## 2024-05-05 ENCOUNTER — Ambulatory Visit (INDEPENDENT_AMBULATORY_CARE_PROVIDER_SITE_OTHER): Admitting: Family Medicine

## 2024-05-05 ENCOUNTER — Encounter: Payer: Self-pay | Admitting: Family Medicine

## 2024-05-05 ENCOUNTER — Other Ambulatory Visit

## 2024-05-05 VITALS — BP 118/84 | HR 84 | Temp 97.8°F | Ht 76.0 in | Wt 321.1 lb

## 2024-05-05 DIAGNOSIS — Z0000 Encounter for general adult medical examination without abnormal findings: Secondary | ICD-10-CM | POA: Diagnosis not present

## 2024-05-05 DIAGNOSIS — F331 Major depressive disorder, recurrent, moderate: Secondary | ICD-10-CM | POA: Diagnosis not present

## 2024-05-05 DIAGNOSIS — N401 Enlarged prostate with lower urinary tract symptoms: Secondary | ICD-10-CM

## 2024-05-05 DIAGNOSIS — R351 Nocturia: Secondary | ICD-10-CM

## 2024-05-05 DIAGNOSIS — E785 Hyperlipidemia, unspecified: Secondary | ICD-10-CM | POA: Diagnosis not present

## 2024-05-05 DIAGNOSIS — G4733 Obstructive sleep apnea (adult) (pediatric): Secondary | ICD-10-CM

## 2024-05-05 DIAGNOSIS — N1831 Chronic kidney disease, stage 3a: Secondary | ICD-10-CM | POA: Diagnosis not present

## 2024-05-05 DIAGNOSIS — E559 Vitamin D deficiency, unspecified: Secondary | ICD-10-CM | POA: Diagnosis not present

## 2024-05-05 DIAGNOSIS — E538 Deficiency of other specified B group vitamins: Secondary | ICD-10-CM

## 2024-05-05 DIAGNOSIS — I77810 Thoracic aortic ectasia: Secondary | ICD-10-CM

## 2024-05-05 DIAGNOSIS — I351 Nonrheumatic aortic (valve) insufficiency: Secondary | ICD-10-CM | POA: Diagnosis not present

## 2024-05-05 DIAGNOSIS — E291 Testicular hypofunction: Secondary | ICD-10-CM

## 2024-05-05 DIAGNOSIS — G250 Essential tremor: Secondary | ICD-10-CM | POA: Diagnosis not present

## 2024-05-05 DIAGNOSIS — N529 Male erectile dysfunction, unspecified: Secondary | ICD-10-CM

## 2024-05-05 DIAGNOSIS — Z7189 Other specified counseling: Secondary | ICD-10-CM

## 2024-05-05 DIAGNOSIS — F5089 Other specified eating disorder: Secondary | ICD-10-CM

## 2024-05-05 MED ORDER — VITAMIN D3 25 MCG (1000 UT) PO CAPS: 1.0000 | ORAL_CAPSULE | Freq: Every day | ORAL | Status: AC

## 2024-05-05 MED ORDER — CYANOCOBALAMIN 500 MCG PO TABS: 500.0000 ug | ORAL_TABLET | ORAL | Status: AC

## 2024-05-05 MED ORDER — BUPROPION HCL ER (SR) 150 MG PO TB12: 150.0000 mg | ORAL_TABLET | Freq: Every day | ORAL | 3 refills | Status: AC

## 2024-05-05 MED ORDER — SILDENAFIL CITRATE 100 MG PO TABS: 50.0000 mg | ORAL_TABLET | Freq: Every day | ORAL | 5 refills | Status: AC | PRN

## 2024-05-05 MED ORDER — BUPROPION HCL ER (SR) 150 MG PO TB12: 150.0000 mg | ORAL_TABLET | Freq: Two times a day (BID) | ORAL | 3 refills | Status: DC

## 2024-05-05 NOTE — Patient Instructions (Addendum)
 You are doing well today Drop wellbutrin  SR to 150mg  once daily  I will order repeat heart ultrasound in [place].  Return as needed or in 1 year for next physical.

## 2024-05-05 NOTE — Progress Notes (Unsigned)
 Ph: [phone] Fax: [phone]   Patient ID: Steve Lynch, male    DOB: [birth year], 68 y.o.   MRN: [id number]  This visit was conducted in person.  BP 118/84   Pulse 84   Temp 97.8 F (36.6 C) (Oral)   Ht 6' 4 (1.93 m)   Wt (!) 321 lb 2 oz (145.7 kg)   SpO2 99%   BMI 39.09 kg/m    CC: CPE Subjective:   HPI: Steve Lynch is a 68 y.o. male presenting on 05/05/2024 for Medical Management of Chronic Issues   Saw health advisor earlier in the month for medicare wellness visit. Note reviewed.   No results found.  Flowsheet Row Office Visit from 05/05/2024 in [facility] [place]  PHQ-2 Total Score 0       05/05/2024   12:30 PM 04/20/2024    3:22 PM 10/21/2023    2:51 PM 08/27/2023    7:58 AM 04/16/2023    2:08 PM  Fall Risk   Falls in the past year? 1 0 0 0 0  Number falls in past yr: 0 0  0   Injury with Fall? 0 0  0   Risk for fall due to :  No Fall Risks     Follow up  Falls evaluation completed      Sees bariatric clinic Dr Midge on wellbutrin  SR.   On testosterone  through urology started 12/2022   Urology evaluation 2024 for gross hematuria in h/o uric acid nephrolithiasis - found to have 4mm distal L ureteral stone treated with flomax . Kidney stone pain since resolved.   Notes ongoing difficulty with anxiety despite wellbutrin  SR 150mg  bid.  Chronic left hand tremor.   OSA on 14/8 iPAP sees pulmonology q35mo.   Notes ED for 6 months - obtaining erection.   Notes chronic exertional dyspnea ongoing for months. No cough, wheezing.    Preventative: COLONOSCOPY WITH PROPOFOL  11/03/2023 - TAx2, diverticulosis, rpt 7 yrs Romero, Ruel, MD)  COLONOSCOPY WITH PROPOFOL  07/15/2023 - unsatisfactory colon prep - rpt 1 month (Anna)  Prostate cancer screening - continues yearly PSA, declines DRE. Denies prostate symptoms.  Lung cancer screening - not eligible  Flu shot - declines  COVID vaccine Pfizer 12/2019, 01/2020, booster  09/2020 Prevnar-20 04/2022 Tdap 02/2022 Shingrix - 02/2021, 06/2021 Advanced directive - has this at home. Wife Ellouise then son are HCPOA. Asked to bring us  copy of living will.  Seat belt use discussed - doesn't use Sunscreen use discussed, no changing moles on skin.  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 Interim medical history since our last visit reviewed. Allergies and medications reviewed and updated. Outpatient Medications Prior to Visit  Medication Sig Dispense Refill   Testosterone  20.25 MG/ACT (1.62%) GEL PLACE 3 PUMP ONTO THE SKIN DAILY. (Patient not taking: Reported on 05/05/2024) 150 g 1   buPROPion  (WELLBUTRIN  SR) 150 MG 12 hr tablet Take 1 tablet (150 mg total) by mouth 2 (two) times daily. 180 tablet 0   Cholecalciferol (VITAMIN D ) 50 MCG (2000 UT) CAPS Take 1 capsule (2,000 Units  total) by mouth daily. 30 capsule    cyanocobalamin  (V-R VITAMIN B-12) 500 MCG tablet Take 1 tablet (500 mcg total) by mouth daily.     testosterone  (TESTIM ) 50 MG/5GM (1%) GEL Place 5 g onto the skin daily. 150 g 3   No facility-administered medications prior to visit.     Per HPI unless specifically indicated in ROS section below Review of Systems  Constitutional:  Negative for activity change, appetite change, chills, fatigue, fever and unexpected weight change.  HENT:  Negative for hearing loss.   Eyes:  Negative for visual disturbance.  Respiratory:  Negative for cough, chest tightness, shortness of breath and wheezing.   Cardiovascular:  Negative for chest pain, palpitations and leg swelling.  Gastrointestinal:  Negative for abdominal distention,  abdominal pain, blood in stool, constipation, diarrhea, nausea and vomiting.  Genitourinary:  Negative for difficulty urinating and hematuria.  Musculoskeletal:  Negative for arthralgias, myalgias and neck pain.  Skin:  Negative for rash.  Neurological:  Negative for dizziness, seizures, syncope and headaches.  Hematological:  Negative for adenopathy. Does not bruise/bleed easily.  Psychiatric/Behavioral:  Negative for dysphoric mood. The patient is not nervous/anxious.     Objective:  BP 118/84   Pulse 84   Temp 97.8 F (36.6 C) (Oral)   Ht 6' 4 (1.93 m)   Wt (!) 321 lb 2 oz (145.7 kg)   SpO2 99%   BMI 39.09 kg/m   Wt Readings from Last 3 Encounters:  05/05/24 (!) 321 lb 2 oz (145.7 kg)  04/20/24 (!) 320 lb (145.2 kg)  02/11/24 (!) 321 lb (145.6 kg)      Physical Exam Vitals and nursing note reviewed.  Constitutional:      General: He is not in acute distress.    Appearance: Normal appearance. He is well-developed. He is not ill-appearing.  HENT:     Head: Normocephalic and atraumatic.     Right Ear: Hearing, tympanic membrane, ear canal and external ear normal.     Left Ear: Hearing, tympanic membrane, ear canal and external ear normal.     Mouth/Throat:     Mouth: Mucous membranes are moist.     Pharynx: Oropharynx is clear. No oropharyngeal exudate or posterior oropharyngeal erythema.  Eyes:     General: No scleral icterus.    Extraocular Movements: Extraocular movements intact.     Conjunctiva/sclera: Conjunctivae normal.     Pupils: Pupils are equal, round, and reactive to light.  Neck:     Thyroid : No thyroid  mass or thyromegaly.     Vascular: No carotid bruit.  Cardiovascular:     Rate and Rhythm: Normal rate and regular rhythm.     Pulses: Normal pulses.          Radial pulses are 2+ on the right side and 2+ on the left side.     Heart sounds: Normal heart sounds. No murmur heard. Pulmonary:     Effort: Pulmonary effort is normal. No respiratory distress.      Breath sounds: Normal breath sounds. No wheezing, rhonchi or rales.  Abdominal:     General: Bowel sounds are normal. There is no distension.     Palpations: Abdomen is soft. There is no mass.     Tenderness: There is no abdominal tenderness. There is no guarding or rebound.     Hernia: No hernia is present.  Musculoskeletal:        General: Swelling (chronic R leg swelling) present. Normal range of motion.  Cervical back: Normal range of motion and neck supple.     Right lower leg: Edema (tr) present.     Left lower leg: Edema (tr) present.  Lymphadenopathy:     Cervical: No cervical adenopathy.  Skin:    General: Skin is warm and dry.     Findings: No rash.  Neurological:     General: No focal deficit present.     Mental Status: He is alert and oriented to person, place, and time.  Psychiatric:        Mood and Affect: Mood normal.        Behavior: Behavior normal.        Thought Content: Thought content normal.        Judgment: Judgment normal.       Results for orders placed or performed in visit on 04/28/24  Comprehensive metabolic panel with GFR   Collection Time: 04/28/24  8:40 AM  Result Value Ref Range   Sodium 141 135 - 145 mEq/L   Potassium 4.4 3.5 - 5.1 mEq/L   Chloride 108 96 - 112 mEq/L   CO2 27 19 - 32 mEq/L   Glucose, Bld 99 70 - 99 mg/dL   BUN 20 6 - 23 mg/dL   Creatinine, Ser 8.95 0.40 - 1.50 mg/dL   Total Bilirubin 0.7 0.2 - 1.2 mg/dL   Alkaline Phosphatase 84 39 - 117 U/L   AST 13 0 - 37 U/L   ALT 12 0 - 53 U/L   Total Protein 7.2 6.0 - 8.3 g/dL   Albumin 4.0 3.5 - 5.2 g/dL   GFR 25.17 >39.99 mL/min   Calcium 9.0 8.4 - 10.5 mg/dL  Lipid panel   Collection Time: 04/28/24  8:40 AM  Result Value Ref Range   Cholesterol 148 0 - 200 mg/dL   Triglycerides 40.9 0.0 - 149.0 mg/dL   HDL 63.29 (L) >60.99 mg/dL   VLDL 88.1 0.0 - 59.9 mg/dL   LDL Cholesterol 899 (H) 0 - 99 mg/dL   Total CHOL/HDL Ratio 4    NonHDL 111.56   CBC with  Differential/Platelet   Collection Time: 04/28/24  8:40 AM  Result Value Ref Range   WBC 8.2 4.0 - 10.5 K/uL   RBC 4.90 4.22 - 5.81 Mil/uL   Hemoglobin 15.0 13.0 - 17.0 g/dL   HCT 55.6 60.9 - 47.9 %   MCV 90.4 78.0 - 100.0 fl   MCHC 33.9 30.0 - 36.0 g/dL   RDW 87.0 88.4 - 84.4 %   Platelets 267.0 150.0 - 400.0 K/uL   Neutrophils Relative % 78.4 (H) 43.0 - 77.0 %   Lymphocytes Relative 14.3 12.0 - 46.0 %   Monocytes Relative 5.3 3.0 - 12.0 %   Eosinophils Relative 1.3 0.0 - 5.0 %   Basophils Relative 0.7 0.0 - 3.0 %   Neutro Abs 6.4 1.4 - 7.7 K/uL   Lymphs Abs 1.2 0.7 - 4.0 K/uL   Monocytes Absolute 0.4 0.1 - 1.0 K/uL   Eosinophils Absolute 0.1 0.0 - 0.7 K/uL   Basophils Absolute 0.1 0.0 - 0.1 K/uL  Parathyroid  hormone, intact (no Ca)   Collection Time: 04/28/24  8:40 AM  Result Value Ref Range   PTH 36 16 - 77 pg/mL  Microalbumin / creatinine urine ratio   Collection Time: 04/28/24  8:40 AM  Result Value Ref Range   Microalb, Ur <0.7 mg/dL   Creatinine,U 865.9 mg/dL   Microalb Creat Ratio Unable to calculate 0.0 - 30.0 mg/g  VITAMIN D  25 Hydroxy (Vit-D Deficiency, Fractures)   Collection Time: 04/28/24  8:40 AM  Result Value Ref Range   VITD 42.29 30.00 - 100.00 ng/mL  Phosphorus   Collection Time: 04/28/24  8:40 AM  Result Value Ref Range   Phosphorus 2.5 2.3 - 4.6 mg/dL  PSA   Collection Time: 04/28/24  8:40 AM  Result Value Ref Range   PSA 3.77 0.10 - 4.00 ng/mL  Vitamin B12   Collection Time: 04/28/24  8:40 AM  Result Value Ref Range   Vitamin B-12 596 211 - 911 pg/mL      05/05/2024   12:31 PM 04/20/2024    3:22 PM 10/21/2023    2:51 PM 08/27/2023    7:58 AM 04/16/2023    2:08 PM  Depression screen PHQ 2/9  Decreased Interest 0 0 0 1 1  Down, Depressed, Hopeless 0 0 0 2 0  PHQ - 2 Score 0 0 0 3 1  Altered sleeping 2 0  2 0  Tired, decreased energy 1 0  2 3  Change in appetite 0 0  2 2  Feeling bad or failure about yourself  0 0  1 1  Trouble concentrating  0 0  1 0  Moving slowly or fidgety/restless 0 0  0 0  Suicidal thoughts 0 0  0 0  PHQ-9 Score 3 0  11 7  Difficult doing work/chores Not difficult at all Not difficult at all  Not difficult at all Somewhat difficult       05/05/2024   12:32 PM 10/21/2023    2:51 PM 08/27/2023    7:59 AM 04/16/2023    2:08 PM  GAD 7 : Generalized Anxiety Score  Nervous, Anxious, on Edge 2 0 1 0  Control/stop worrying 0 0 1 0  Worry too much - different things 0 0 1 1  Trouble relaxing 0 0 0 0  Restless 0 0 0 0  Easily annoyed or irritable 1 0 1 1  Afraid - awful might happen 1 0 1 1  Total GAD 7 Score 4 0 5 3  Anxiety Difficulty Not difficult at all  Not difficult at all Somewhat difficult   Assessment & Plan:   Problem List Items Addressed This Visit   None Visit Diagnoses       Other disorder of eating - emotional eating       Relevant Medications   buPROPion  (WELLBUTRIN  SR) 150 MG 12 hr tablet        Meds ordered this encounter  Medications   Cholecalciferol (VITAMIN D3) 25 MCG (1000 UT) CAPS    Sig: Take 1 capsule (1,000 Units total) by mouth daily.   cyanocobalamin  (V-R VITAMIN B-12) 500 MCG tablet    Sig: Take 1 tablet (500 mcg total) by mouth once a week.   DISCONTD: buPROPion  (WELLBUTRIN  SR) 150 MG 12 hr tablet    Sig: Take 1 tablet (150 mg total) by mouth 2 (two) times daily.    Dispense:  180 tablet    Refill:  3   buPROPion  (WELLBUTRIN  SR) 150 MG 12 hr tablet    Sig: Take 1 tablet (150 mg total) by mouth daily.    Dispense:  90 tablet    Refill:  3   sildenafil  (VIAGRA ) 100 MG tablet    Sig: Take 0.5-1 tablets (50-100 mg total) by mouth daily as needed for erectile dysfunction.    Dispense:  5 tablet    Refill:  5  No orders of the defined types were placed in this encounter.   Patient Instructions  You are doing well today Drop wellbutrin  SR to 150mg  once daily  I will order repeat heart ultrasound in [place].  Return as needed or in 1 year for next physical.    Follow up plan: Return in about 1 year (around 05/05/2025) for medicare wellness visit.  Anton Blas, MD

## 2024-05-06 ENCOUNTER — Telehealth: Payer: Self-pay | Admitting: Family Medicine

## 2024-05-06 DIAGNOSIS — N529 Male erectile dysfunction, unspecified: Secondary | ICD-10-CM | POA: Insufficient documentation

## 2024-05-06 DIAGNOSIS — I517 Cardiomegaly: Secondary | ICD-10-CM

## 2024-05-06 NOTE — Assessment & Plan Note (Signed)
 Established with cardiology.  Would be due for repeat echocardiogram 05/2024 - reviewing chart, this was ordered by cardiology last year - will provide pt with # to schedule this.

## 2024-05-06 NOTE — Assessment & Plan Note (Signed)
 Followed by urology.

## 2024-05-06 NOTE — Assessment & Plan Note (Signed)
 Desires trial of Viagra . He can start with 50 mg dose, and increase to 100 mg if necessary. The method of use 1 hour prior to anticipated intercourse is explained. He should not use any more than one tablet in a 24 hour period. The side effects of possible headache, flushing, dyspepsia and transient changes in vision have been explained. Advised to seek urgent medical care if he develops priapism. The patient is not taking nitrates. I have counseled him that taking Viagra  with nitrates of any form can cause life threatening drops in blood pressure.

## 2024-05-06 NOTE — Telephone Encounter (Signed)
 Plz notify pt - reviewing chart, it sees cardiologist already ordered heart ultrasound last year to be completed by 05/30/2024.  Recommend he [place] cardiology office at [phone] and ask to schedule 1 yr f/u heart ultrasound.

## 2024-05-06 NOTE — Assessment & Plan Note (Signed)
 GFR remains 70s - will resolve from problem list.

## 2024-05-06 NOTE — Assessment & Plan Note (Addendum)
 Weight loss noted on wellbutrin  through bariatric clinic. See below.

## 2024-05-06 NOTE — Assessment & Plan Note (Signed)
 Managed with wellbutrin . Notes increase in anxiety.  Will drop wellbutrin  SR 150mg  from BID to once daily.

## 2024-05-06 NOTE — Assessment & Plan Note (Addendum)
 Dropping wellbutrin  dose should decrease tremor.

## 2024-05-06 NOTE — Telephone Encounter (Signed)
 Called patient reviewed all information and repeated back to me. Will call if any questions.  ? ?

## 2024-05-06 NOTE — Assessment & Plan Note (Addendum)
 Continues BiPAP, followed by pulmonology

## 2024-05-06 NOTE — Assessment & Plan Note (Signed)
 PSA remains high normal.

## 2024-05-06 NOTE — Assessment & Plan Note (Signed)
Advanced directive - has this at home. Wife Tina then son are HCPOA. Asked to bring us copy of living will.  

## 2024-05-06 NOTE — Assessment & Plan Note (Signed)
 Chronic, mild off medication.  Consider cardiac CT with CAC discussion at next visit.  The 10-year ASCVD risk score (Arnett DK, et al., 2019) is: 11.7%   Values used to calculate the score:     Age: 68 years     Clincally relevant sex: Male     Is Non-Hispanic African American: No     Diabetic: No     Tobacco smoker: No     Systolic Blood Pressure: 118 mmHg     Is BP treated: No     HDL Cholesterol: 36.7 mg/dL     Total Cholesterol: 148 mg/dL

## 2024-05-06 NOTE — Assessment & Plan Note (Signed)
 Mild-mod on latest echo 05/2023.

## 2024-05-06 NOTE — Assessment & Plan Note (Signed)
 Continue weekly b12 replacement.

## 2024-05-06 NOTE — Assessment & Plan Note (Signed)
 Preventative protocols reviewed and updated unless pt declined. Discussed healthy diet and lifestyle.

## 2024-05-06 NOTE — Assessment & Plan Note (Addendum)
 Continue vit D 2000 units daily

## 2024-05-07 NOTE — Addendum Note (Signed)
 Addended by: BRIEN SALM on: 05/07/2024 11:40 AM   Modules accepted: Orders

## 2024-05-12 ENCOUNTER — Other Ambulatory Visit

## 2024-05-12 DIAGNOSIS — E291 Testicular hypofunction: Secondary | ICD-10-CM

## 2024-05-13 LAB — TESTOSTERONE: Testosterone: 318 ng/dL (ref 264–916)

## 2024-05-13 LAB — HEMOGLOBIN AND HEMATOCRIT, BLOOD
Hematocrit: 44.9 % (ref 37.5–51.0)
Hemoglobin: 14.6 g/dL (ref 13.0–17.7)

## 2024-05-16 ENCOUNTER — Ambulatory Visit: Payer: Self-pay | Admitting: Urology

## 2024-05-17 NOTE — Progress Notes (Signed)
 He has been applying the Gel

## 2024-05-18 ENCOUNTER — Other Ambulatory Visit: Payer: Self-pay | Admitting: Urology

## 2024-05-18 DIAGNOSIS — E291 Testicular hypofunction: Secondary | ICD-10-CM

## 2024-05-19 ENCOUNTER — Other Ambulatory Visit: Payer: Self-pay

## 2024-05-19 NOTE — Telephone Encounter (Signed)
 Pt. States that he would like to stay on the gels

## 2024-05-24 MED ORDER — TESTOSTERONE 20.25 MG/ACT (1.62%) TD GEL: TRANSDERMAL | 1 refills | Status: DC

## 2024-06-01 ENCOUNTER — Other Ambulatory Visit: Payer: Self-pay | Admitting: Urology

## 2024-06-01 DIAGNOSIS — E291 Testicular hypofunction: Secondary | ICD-10-CM

## 2024-06-01 MED ORDER — TESTOSTERONE 50 MG/5GM (1%) TD GEL: 5.0000 g | Freq: Every day | TRANSDERMAL | 1 refills | Status: DC

## 2024-06-24 ENCOUNTER — Ambulatory Visit: Attending: Cardiology

## 2024-06-24 DIAGNOSIS — I517 Cardiomegaly: Secondary | ICD-10-CM | POA: Diagnosis not present

## 2024-06-24 LAB — ECHOCARDIOGRAM COMPLETE
AR max vel: 4.71 cm2
AV Area VTI: 4.3 cm2
AV Area mean vel: 4.48 cm2
AV Mean grad: 5 mmHg
AV Peak grad: 8 mmHg
Ao pk vel: 1.41 m/s
Area-P 1/2: 2.74 cm2
S' Lateral: 3.6 cm

## 2024-06-25 ENCOUNTER — Ambulatory Visit: Payer: Self-pay | Admitting: Cardiology

## 2024-07-07 ENCOUNTER — Encounter: Payer: Self-pay | Admitting: Cardiology

## 2024-07-07 ENCOUNTER — Ambulatory Visit: Attending: Cardiology | Admitting: Cardiology

## 2024-07-07 VITALS — BP 119/78 | HR 80 | Ht 75.0 in | Wt 327.0 lb

## 2024-07-07 DIAGNOSIS — I77810 Thoracic aortic ectasia: Secondary | ICD-10-CM | POA: Diagnosis not present

## 2024-07-07 DIAGNOSIS — I351 Nonrheumatic aortic (valve) insufficiency: Secondary | ICD-10-CM
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
## 2024-07-07 NOTE — Patient Instructions (Addendum)
 Medication Instructions:  Your physician recommends that you continue on your current medications as directed. Please refer to the Current Medication list given to you today.   *If you need a refill on your cardiac medications before your next appointment, please [place] your pharmacy*  Lab Work: No labs ordered today  If you have labs (blood work) drawn today and your tests are completely normal, you will receive your results only by: MyChart Message (if you have MyChart) OR A paper copy in the mail If you have any lab test that is abnormal or we need to change your treatment, we will [place] you to review the results.  Testing/Procedures: Your physician has requested that you have an echocardiogram in 6 months.  Echocardiography is a painless test that uses sound waves to create images of your heart. It provides your doctor with information about the size and shape of your heart and how well your heart's chambers and valves are working.   You may receive an ultrasound enhancing agent through an IV if needed to better visualize your heart during the echo. This procedure takes approximately one hour.  There are no restrictions for this procedure.  This will take place at [address] [facility] [address]) [address], [place] [address]  Please note: We ask at that you not bring children with you during ultrasound (echo/ vascular) testing. Due to room size and safety concerns, children are not allowed in the ultrasound rooms during exams. Our front office staff cannot provide observation of children in our lobby area while testing is being conducted. An adult accompanying a patient to their appointment will only be allowed in the ultrasound room at the discretion of the ultrasound technician under special circumstances. We apologize for any inconvenience.   CT Angiography (CTA) chest/aorta, is a special type of CT scan that uses a computer to produce multi-dimensional views of major blood vessels  throughout the body. In CT angiography, a contrast material is injected through an IV to help visualize the blood vessels  Nothing to eat or drink 4 hours prior to test  [facility] [address]  [place], [place] [address]   Follow-Up: At [facility], you and your health needs are our priority.  As part of our continuing mission to provide you with exceptional heart care, our providers are all part of one team.  This team includes your primary Cardiologist (physician) and Advanced Practice Providers or APPs (Physician Assistants and Nurse Practitioners) who all work together to provide you with the care you need, when you need it.  Your next appointment:   6 month(s)  Provider:   You may see Redell Cave, MD or one of the following Advanced Practice Providers on your designated Care Team:   Lonni Meager, NP Lesley Maffucci, PA-C Bernardino Bring, PA-C Cadence [place], PA-C Tylene Lunch, NP Barnie Hila, NP    We recommend signing up for the patient portal called MyChart.  Sign up information is provided on this After Visit Summary.  MyChart is used to connect with patients for Virtual Visits (Telemedicine).  Patients are able to view lab/test results, encounter notes, upcoming appointments, etc.  Non-urgent messages can be sent to your provider as well.   To learn more about what you can do with MyChart, go to [email or web address].

## 2024-07-14 ENCOUNTER — Ambulatory Visit
Admission: RE | Admit: 2024-07-14 | Discharge: 2024-07-14 | Disposition: A | Discharge status: Home / Self Care | Source: Ambulatory Visit | Attending: Cardiology | Admitting: Cardiology

## 2024-07-14 DIAGNOSIS — I77810 Thoracic aortic ectasia: Secondary | ICD-10-CM | POA: Diagnosis not present

## 2024-07-14 DIAGNOSIS — I351 Nonrheumatic aortic (valve) insufficiency: Secondary | ICD-10-CM | POA: Insufficient documentation

## 2024-07-14 DIAGNOSIS — R9431 Abnormal electrocardiogram [ECG] [EKG]: Secondary | ICD-10-CM | POA: Diagnosis not present

## 2024-07-14 MED ORDER — IOHEXOL 350 MG/ML SOLN
75.0000 mL | Freq: Once | INTRAVENOUS | Status: AC | PRN
  Administered 2024-07-14: 75 mL via INTRAVENOUS

## 2024-07-22 ENCOUNTER — Ambulatory Visit: Payer: Self-pay | Admitting: Cardiology

## 2024-07-28 ENCOUNTER — Encounter (INDEPENDENT_AMBULATORY_CARE_PROVIDER_SITE_OTHER): Payer: Self-pay

## 2024-08-05 ENCOUNTER — Ambulatory Visit

## 2024-08-05 VITALS — BP 132/83 | HR 74 | Resp 20 | Ht 75.0 in | Wt 336.3 lb

## 2024-08-05 DIAGNOSIS — I7121 Aneurysm of the ascending aorta, without rupture: Secondary | ICD-10-CM

## 2024-08-05 NOTE — Patient Instructions (Signed)
 Risk Modification in those with ascending thoracic aortic aneurysm:   Continue control of blood pressure (prefer BP 130/80 or less)   2. Avoid fluoroquinolone antibiotics (I.e Ciprofloxacin, Avelox, Levofloxacin, Ofloxacin)   3.  Use of statin (to decrease cardiovascular risk)   4.  Exercise and activity limitations is individualized, but in general, contact sports are to be avoided and one should avoid heavy lifting (defined as half of ideal body weight) and exercises involving sustained Valsalva maneuver.   5.  Follow-up in 6 months with CTA chest.  OK to use a non-contrast CT if you have had a recent study for surveillance of the lung nodule.

## 2024-08-05 NOTE — Progress Notes (Signed)
 [address] [place] [address]             [phone]            Tilak Oakley Widdowson [id number] 08/23/1956   History of Present Illness:  Kavari Bos is a 68 year old man with medical history of aortic regurgitation, OSA, essential tremor, osteoarthritis, degenerative disc disease of lumbar spine, kidney stones, and hyperlipidemia who presents for initial encounter of ascending thoracic aortic aneurysm.  This was found on echocardiogram in 04/2023 and measured 4.3cm.  He has been referred to our clinic since aneurysm has increased in size over the past year.  Echocardiogram in 06/2024 measured aneurysm at 4.7 cm. CTA of chest was obtained and aneurysm measured 4.6 cm. Aortic root measured 4.7 cm. Echocardiogram showed tricuspid aortic valve with mild to moderate aortic regurgitation.   He presents to the clinic today with his wife.  His blood pressure is well controlled without the use of medications. He does not exercise and denies heavy lifting. He denies chest pain, shortness of breath, lower leg edema.   Current Outpatient Medications on File Prior to Visit  Medication Sig Dispense Refill   buPROPion  (WELLBUTRIN  SR) 150 MG 12 hr tablet Take 1 tablet (150 mg total) by mouth daily. 90 tablet 3   Cholecalciferol (VITAMIN D3) 25 MCG (1000 UT) CAPS Take 1 capsule (1,000 Units total) by mouth daily.     cyanocobalamin  (V-R VITAMIN B-12) 500 MCG tablet Take 1 tablet (500 mcg total) by mouth once a week.     sildenafil  (VIAGRA ) 100 MG tablet Take 0.5-1 tablets (50-100 mg total) by mouth daily as needed for erectile dysfunction. 5 tablet 5   testosterone  (ANDROGEL ) 50 MG/5GM (1%) GEL Place 5 g onto the skin daily. 150 g 1   No current facility-administered medications on file prior to visit.     ROS: Review of Systems  Constitutional: Negative.  Negative for fever and malaise/fatigue.  Respiratory: Negative.  Negative for cough, shortness of breath and  wheezing.   Cardiovascular: Negative.  Negative for chest pain and leg swelling.  Neurological: Negative.  Negative for dizziness and headaches.     BP 132/83 (BP Location: Right Arm, Patient Position: Sitting, Cuff Size: Normal)   Pulse 74   Resp 20   Ht 6' 3 (1.905 m)   Wt (!) 336 lb 4.8 oz (152.5 kg)   SpO2 96% Comment: RA  BMI 42.03 kg/m   Physical Exam Constitutional:      Appearance: Normal appearance.  HENT:     Head: Normocephalic and atraumatic.  Cardiovascular:     Rate and Rhythm: Normal rate and regular rhythm.     Heart sounds: Normal heart sounds, S1 normal and S2 normal.  Pulmonary:     Effort: Pulmonary effort is normal.     Breath sounds: Normal breath sounds.  Skin:    General: Skin is warm and dry.  Neurological:     General: No focal deficit present.     Mental Status: He is alert and oriented to person, place, and time.      Imaging: CLINICAL DATA:  Ascending aortic dilatation. Aortic valve insufficiency. Abnormal electrocardiogram.   EXAM: CT ANGIOGRAPHY CHEST WITH CONTRAST   TECHNIQUE: Multidetector CT imaging of the chest was performed using the standard protocol during bolus administration of intravenous contrast. Multiplanar CT image reconstructions and MIPs were obtained to evaluate the vascular anatomy.  RADIATION DOSE REDUCTION: This exam was performed according to the departmental dose-optimization program which includes automated exposure control, adjustment of the mA and/or kV according to patient size and/or use of iterative reconstruction technique.   CONTRAST:  75mL OMNIPAQUE  IOHEXOL  350 MG/ML SOLN   COMPARISON:  None Available.   FINDINGS: Cardiovascular: Dilated ascending aorta. The aorta measures 4.7 cm at the sinuses of Valsalva, 4.1 cm at the sino-tubular junction, 4.6 cm in the proximal ascending, 4.5 cm in the mid ascending. Normal transverse aorta at 3.7 cm. Descending aorta is tortuous but normal in caliber.  Minor aortic atherosclerosis the heart is enlarged. No pericardial effusion. No significant coronary artery calcifications.   Mediastinum/Nodes: Small hiatal hernia. No mediastinal or hilar lymphadenopathy. Unremarkable appearance of the thyroid  gland.   Lungs/Pleura: No focal airspace disease. No pleural effusion. No features of pulmonary edema. No pulmonary nodule or mass. Trachea and central airways are clear.   Upper Abdomen: Included abdominal aorta is normal in caliber. No acute upper abdominal findings.   Musculoskeletal: Thoracic spondylosis with anterior osteophytes in the mid and lower thoracic spine. There are no acute or suspicious osseous abnormalities. Minimal glenohumeral degenerative change.   Review of the MIP images confirms the above findings.   IMPRESSION: 1. Dilated ascending aorta, maximal dimension 4.6 cm. Recommend semi-annual imaging followup by CTA or MRA and referral to cardiothoracic surgery if not already obtained. This recommendation follows 2010 ACCF/AHA/AATS/ACR/ASA/SCA/SCAI/SIR/STS/SVM Guidelines for the Diagnosis and Management of Patients With Thoracic Aortic Disease. Circulation. 2010; 121: [id number]. Aortic aneurysm NOS (ICD10-I71.9) 2. Cardiomegaly. 3. Small hiatal hernia.   Aortic Atherosclerosis (ICD10-I70.0).     Electronically Signed   By: Andrea Gasman M.D.   On: 07/19/2024 20:00   A/P: Aneurysm of ascending aorta without rupture -4.6 cm ascending thoracic aortic aneurysm and 4.7 cm aortic root on CTA of chest. Echocardiogram showed tricuspid aortic valve with mild to moderate aortic regurgitation.  -We discussed the natural history and and risk factors for growth of ascending aortic aneurysms. Discussed recommendations to minimize the risk of further expansion or dissection including careful blood pressure control, avoidance of contact sports and heavy lifting, attention to lipid management.  We covered the importance of staying  never user of tobacco.  The patient does not yet meet surgical criteria of >5.5cm. The patient is aware of signs and symptoms of aortic dissection and when to present to the emergency department   -Follow up in 6 months with CTA of chest for continued surveillance   Risk Modification:  Statin:  not currently prescribed  Smoking cessation instruction/counseling given:  never user  Patient was counseled on importance of Blood Pressure Control  They are instructed to contact their Primary Care Physician if they start to have blood pressure readings over 130s/90s. Do not ever stop blood pressure medications on your own, unless instructed by healthcare professional.  Please avoid use of Fluoroquinolones as this can potentially increase your risk of Aortic Rupture and/or Dissection  Patient educated on signs and symptoms of Aortic Dissection, handout also provided in AVS  Manuelita CHRISTELLA Rough, PA-C 08/05/24

## 2024-10-20 ENCOUNTER — Ambulatory Visit: Payer: Self-pay

## 2024-10-20 ENCOUNTER — Encounter: Payer: Self-pay | Admitting: Family Medicine

## 2024-10-20 ENCOUNTER — Ambulatory Visit: Payer: Self-pay | Admitting: Family Medicine

## 2024-10-20 ENCOUNTER — Ambulatory Visit
Admission: RE | Admit: 2024-10-20 | Discharge: 2024-10-20 | Disposition: A | Discharge status: Home / Self Care | Source: Ambulatory Visit | Attending: Family Medicine | Admitting: Family Medicine

## 2024-10-20 ENCOUNTER — Ambulatory Visit
Admission: RE | Admit: 2024-10-20 | Discharge: 2024-10-20 | Disposition: A | Discharge status: Home / Self Care | Attending: Family Medicine | Admitting: Family Medicine

## 2024-10-20 ENCOUNTER — Ambulatory Visit (INDEPENDENT_AMBULATORY_CARE_PROVIDER_SITE_OTHER): Admitting: Family Medicine

## 2024-10-20 VITALS — BP 140/78 | HR 92 | Resp 16 | Ht 75.0 in | Wt 340.0 lb

## 2024-10-20 DIAGNOSIS — R051 Acute cough: Secondary | ICD-10-CM

## 2024-10-20 MED ORDER — PREDNISONE 10 MG PO TABS: 10.0000 mg | ORAL_TABLET | Freq: Two times a day (BID) | ORAL | 0 refills | Status: DC

## 2024-10-20 NOTE — Telephone Encounter (Signed)
 Noted. Pt has appt later this afternoon to be seen for this issue

## 2024-10-20 NOTE — Telephone Encounter (Signed)
 FYI Only or Action Required?: FYI only for provider: appointment scheduled on 1/7 at other office.  Patient was last seen in primary care on 05/05/2024 by Rilla Baller, MD.  Called Nurse Triage reporting Cough.  Symptoms began several weeks ago.  Interventions attempted: OTC medications: claritin, delsym and Rest, hydration, or home remedies.  Symptoms are: unchanged.  Triage Disposition: See Physician Within 24 Hours  Patient/caregiver understands and will follow disposition?: Yes     Copied from CRM #[id number]. Topic: Clinical - Red Word Triage >> Oct 20, 2024  9:37 AM Ivette P wrote: Red Word that prompted transfer to Nurse Triage: Pt has had a cough for a couple weeks. So bad making him vomit all his food. Wanting to see PCP Reason for Disposition  SEVERE coughing spells (e.g., whooping sound after coughing, vomiting after coughing)  Answer Assessment - Initial Assessment Questions This RN recommended pt be examined in next 24 hours, scheduled for today at other office in region. Advised [place]ru[place]i back or seek immediate care if new or worsening symptoms.    Been over 2 weeks Claritin, delsym, not working No SOB Not coughing anything up One day coughed all day chest bothered me then but just soreness, other than that no No abnormal sounds with breathing Nothing in chest at all Just stupid dry cough No covid/flu test No fever Vomiting only from coughing so hard, able to keep down liquids and food otherwise No weakness  Protocols used: Cough - Acute Non-Productive-A-AH

## 2024-10-20 NOTE — Progress Notes (Signed)
" ° °  Acute Office Visit  Subjective:     Patient ID: Steve Lynch, male    DOB: 11/19/1955, 69 y.o.   MRN: [id number]  Chief Complaint  Patient presents with   Cough    X2 weeks, non-productive. No chest congestion    HPI Patient is in today for complaints of cough. He is a new patient to me. He describes cough to be non-productive. He voices symptom onset of two weeks ago. He voices he did work hard yesterday and endorses body aches, but prior to yesterday no body aches. He does endorse episodes of vomiting with cough. He denies nausea associated with vomiting. He states last Monday he started OTC Claritin. He voices some improvement in cough after starting Claritin. He voices he has OTC Delsym and states this helps providing temporary relief of cough. He voices his wife had similar symptoms two weeks before him but her symptoms resolved. Denies other known sick contacts or exposures.  He voices for the first week of symptoms he did have sinus headaches but this resolved after the first one week.  Review of Systems  Constitutional:  Negative for chills and fever.  HENT:  Negative for congestion, sinus pain and sore throat.   Respiratory:  Positive for cough. Negative for sputum production and shortness of breath.   Cardiovascular:  Negative for chest pain.  Gastrointestinal:  Positive for vomiting. Negative for diarrhea and nausea.  Musculoskeletal:  Positive for myalgias.  Neurological:  Negative for headaches.        Objective:    BP (!) 140/78   Pulse 92   Resp 16   Ht 6' 3 (1.905 m)   Wt (!) 340 lb (154.2 kg)   SpO2 99%   BMI 42.50 kg/m    Physical Exam Constitutional:      General: He is not in acute distress.    Appearance: He is obese. He is not toxic-appearing.  Cardiovascular:     Rate and Rhythm: Normal rate and regular rhythm.  Pulmonary:     Effort: Pulmonary effort is normal. No respiratory distress.     Breath sounds: Normal breath sounds. No wheezing,  rhonchi or rales.  Skin:    General: Skin is warm and dry.  Neurological:     General: No focal deficit present.     Mental Status: He is alert.  Psychiatric:        Mood and Affect: Mood normal.        Behavior: Behavior normal.        Assessment & Plan:   Assessment & Plan Acute cough Patient is a 69 year old male with complaints of cough for the last two weeks. He denies sputum production, stating cough is non-productive.   V/s stable. All lung fields clear to auscultation.  -STAT chest x-ray ordered -Prednisone  10mg  BID x 5 days -Continue OTC Delsym as needed for cough -Ok to continue OTC Claritin -Return precautions advised. ED precautions reviewed.  Orders:   predniSONE  (DELTASONE ) 10 MG tablet; Take 1 tablet (10 mg total) by mouth 2 (two) times daily with a meal.   DG Chest 2 View; Future     Return if symptoms worsen or fail to improve.  LAYMON LOISE CORE, FNP   "

## 2024-10-25 ENCOUNTER — Telehealth: Payer: Self-pay

## 2024-10-25 MED ORDER — BENZONATATE 100 MG PO CAPS: 100.0000 mg | ORAL_CAPSULE | Freq: Three times a day (TID) | ORAL | 0 refills | Status: DC | PRN

## 2024-10-25 NOTE — Telephone Encounter (Signed)
 Copied from CRM [id number]. Topic: Clinical - Medical Advice >> Oct 25, 2024 10:47 AM Montie POUR wrote: Reason for CRM:  He came in on 10/20/24 for an acute cough. He still has the cough. It is not worse but no better. Please Geddes Keisean at [phone] to discuss what to take. He is going out of town tomorrow.   Patient was seen outside our office for eval and xray.

## 2024-10-25 NOTE — Telephone Encounter (Signed)
 Ensure no fever, cough not worsening.  How long will he be out of town?  I will prescribe tessalon  perls for cough suppression -swallow don't chew. If ongoing cough despite this, rec OV in our office for follow up.

## 2024-10-25 NOTE — Addendum Note (Signed)
 Addended by: RILLA BALLER on: 10/25/2024 05:22 PM   Modules accepted: Orders

## 2024-10-26 NOTE — Telephone Encounter (Signed)
 Spoke with pt, he did not pick up tessalon  perls.  Has had them in the past and they are not effective. He bought OTC Vicks cough syrup with Honey.  Will be out of town until Friday.  Cough is dry and aggravating. Does not feel bad, just a dry cough he cannot get over.

## 2024-10-26 NOTE — Addendum Note (Signed)
 Addended by: RILLA BALLER on: 10/26/2024 05:25 PM   Modules accepted: Orders

## 2024-10-26 NOTE — Telephone Encounter (Signed)
 Called patient declined having something called in will continue to take otc meds and reach back out in a few days if no improvement.

## 2024-10-26 NOTE — Telephone Encounter (Signed)
 Noted! Thank you

## 2024-10-26 NOTE — Telephone Encounter (Signed)
 Did he want to try different Rx cough syrup? I could send in codeine  for him. Rec OV if not improved with this.

## 2024-11-12 ENCOUNTER — Encounter: Payer: Self-pay | Admitting: Family Medicine

## 2024-11-12 ENCOUNTER — Ambulatory Visit: Admitting: Family Medicine

## 2024-11-12 VITALS — BP 122/94 | HR 89 | Temp 98.2°F | Ht 77.0 in | Wt 343.6 lb

## 2024-11-12 DIAGNOSIS — N401 Enlarged prostate with lower urinary tract symptoms: Secondary | ICD-10-CM | POA: Diagnosis not present

## 2024-11-12 DIAGNOSIS — R030 Elevated blood-pressure reading, without diagnosis of hypertension: Secondary | ICD-10-CM

## 2024-11-12 DIAGNOSIS — N529 Male erectile dysfunction, unspecified: Secondary | ICD-10-CM | POA: Diagnosis not present

## 2024-11-12 DIAGNOSIS — R413 Other amnesia: Secondary | ICD-10-CM | POA: Diagnosis not present

## 2024-11-12 DIAGNOSIS — R351 Nocturia: Secondary | ICD-10-CM | POA: Diagnosis not present

## 2024-11-12 DIAGNOSIS — I77810 Thoracic aortic ectasia: Secondary | ICD-10-CM

## 2024-11-12 DIAGNOSIS — G4733 Obstructive sleep apnea (adult) (pediatric): Secondary | ICD-10-CM | POA: Diagnosis not present

## 2024-11-12 DIAGNOSIS — F419 Anxiety disorder, unspecified: Secondary | ICD-10-CM | POA: Diagnosis not present

## 2024-11-12 LAB — POCT URINALYSIS DIPSTICK
Bilirubin, UA: NEGATIVE
Blood, UA: NEGATIVE
Glucose, UA: NEGATIVE
Ketones, UA: POSITIVE
Leukocytes, UA: NEGATIVE
Nitrite, UA: NEGATIVE
Protein, UA: POSITIVE — AB
Spec Grav, UA: >=1.03 — AB
Urobilinogen, UA: 0.2 U/dL
pH, UA: 6

## 2024-11-12 MED ORDER — TAMSULOSIN HCL 0.4 MG PO CAPS: 0.4000 mg | ORAL_CAPSULE | Freq: Every day | ORAL | 6 refills | Status: AC

## 2024-11-12 NOTE — Patient Instructions (Addendum)
 Reviewed 4 core lifestyle modifications to support a healthy mind:  1. Nutritious well balance diet.  2. Regular physical activity routine.  3. Regular mental activity such as reading books, word puzzles, math puzzles, jigsaw puzzles.  4. Social engagement.  Also ensure good blood pressure control, limit alcohol, no smoking.   Let me know if worsening memory trouble -testing today overall normal.  Urinalysis today  Prostate exam suspicious for benign enlargement of the prostate.  Start flomax  0.4mg  nightly.   Let us  know if new or worsening symptoms develop

## 2024-11-12 NOTE — Progress Notes (Unsigned)
 " Ph: [phone] Fax: [phone]   Patient ID: Steve Lynch, male    DOB: 07-Dec-1955, 69 y.o.   MRN: [id number]  This visit was conducted in person.  BP (!) 122/94 (BP Location: Right Arm, Cuff Size: Large)   Pulse 89   Temp 98.2 F (36.8 C) (Oral)   Ht 6' 5 (1.956 m)   Wt (!) 343 lb 9.6 oz (155.9 kg)   SpO2 96%   BMI 40.75 kg/m   BP Readings from Last 3 Encounters:  11/12/24 (!) 122/94  10/20/24 (!) 140/78  08/05/24 132/83    CC: discuss mood, prostate, memory Subjective:   HPI: Steve Lynch is a 69 y.o. male presenting on 11/12/2024 for Mood and Prostate Concerns   Fully retired 10/22/2024.   Ongoing nocturia x3 at night. Notes incomplete emptying with this, dribbling with leaking after urinating - ongoing for the past 1-2 months.  No fevers/chills, flank pain, dysuria, urgency, frequency, hematuria.  H/o uric acid stones but no recent symptoms.  Lab Results  Component Value Date   PSA 3.77 04/28/2024   PSA 3.63 04/14/2023   PSA 2.44 12/09/2022   Notes worsening talking in sleep.  No abnormal movement in sleep.  Continues OSA on BiPAP.  No sleep walking or eating. Not using sleep aides.   Notes progressive worsening memory difficulty - ie remembering words, losing car keys. No noted personality changes - but he notes he's quieter.  Notes occ L arm tremor.  No noted stiffness, imbalance, gait unsteadiness.  No hallucinations.  Notes more emotional lability - longstanding.  No known fmhx dementia.  Anxiety - on wellbutrin  SR 150mg  daily. Overall stable period with this.  He stopped testosterone  replacement 6 months ago - didn't notice significant effect.  ED - manage with 1/2 tablet of viagra .   Geriatric Assessment: Activities of Daily Living:     Bathing-I     Dressing-I     Eating-I     Toileting-I    Transferring-I    Continence-I Overall Assessment: independent    Instrumental Activities of Daily Living:     Transportation- I     Meal/Food  Preparation- I     Shopping Errands- I     Housekeeping/Chores- I     Money Management/Finances- I     Medication Management- I     Ability to Use Telephone- I     Laundry- I  Overall Assessment:  independent  Mental Status Exam: 29/30(value/max value)    Clock Drawing Score: 4/4       Relevant past medical, surgical, family and social history reviewed and updated as indicated. Interim medical history since our last visit reviewed. Allergies and medications reviewed and updated. Outpatient Medications Prior to Visit  Medication Sig Dispense Refill   buPROPion  (WELLBUTRIN  SR) 150 MG 12 hr tablet Take 1 tablet (150 mg total) by mouth daily. 90 tablet 3   Cholecalciferol (VITAMIN D3) 25 MCG (1000 UT) CAPS Take 1 capsule (1,000 Units total) by mouth daily.     cyanocobalamin  (V-R VITAMIN B-12) 500 MCG tablet Take 1 tablet (500 mcg total) by mouth once a week.     sildenafil  (VIAGRA ) 100 MG tablet Take 0.5-1 tablets (50-100 mg total) by mouth daily as needed for erectile dysfunction. 5 tablet 5   predniSONE  (DELTASONE ) 10 MG tablet Take 1 tablet (10 mg total) by mouth 2 (two) times daily with a meal. (Patient not taking: Reported on 11/12/2024) 10 tablet 0   testosterone  (  ANDROGEL ) 50 MG/5GM (1%) GEL Place 5 g onto the skin daily. (Patient not taking: Reported on 11/12/2024) 150 g 1   No facility-administered medications prior to visit.     Per HPI unless specifically indicated in ROS section below Review of Systems  Objective:  BP (!) 122/94 (BP Location: Right Arm, Cuff Size: Large)   Pulse 89   Temp 98.2 F (36.8 C) (Oral)   Ht 6' 5 (1.956 m)   Wt (!) 343 lb 9.6 oz (155.9 kg)   SpO2 96%   BMI 40.75 kg/m   Wt Readings from Last 3 Encounters:  11/12/24 (!) 343 lb 9.6 oz (155.9 kg)  10/20/24 (!) 340 lb (154.2 kg)  08/05/24 (!) 336 lb 4.8 oz (152.5 kg)      Physical Exam Vitals and nursing note reviewed.  Constitutional:      Appearance: Normal appearance. He is not  ill-appearing.  HENT:     Head: Normocephalic and atraumatic.     Mouth/Throat:     Mouth: Mucous membranes are moist.     Pharynx: Oropharynx is clear. No oropharyngeal exudate or posterior oropharyngeal erythema.  Cardiovascular:     Rate and Rhythm: Normal rate and regular rhythm.     Pulses: Normal pulses.     Heart sounds: Normal heart sounds. No murmur heard. Pulmonary:     Effort: Pulmonary effort is normal. No respiratory distress.     Breath sounds: Normal breath sounds. No wheezing, rhonchi or rales.  Genitourinary:    Prostate: Not enlarged, not tender and no nodules present.     Rectum: Normal. No mass, tenderness, anal fissure or external hemorrhoid.     Comments: Moderate sized prostate without nodules tenderness or induration Skin:    General: Skin is warm and dry.     Findings: No rash.  Neurological:     Mental Status: He is alert.  Psychiatric:        Mood and Affect: Mood normal.        Behavior: Behavior normal.       Results for orders placed or performed in visit on 11/12/24  POCT urinalysis dipstick   Collection Time: 11/12/24  2:58 PM  Result Value Ref Range   Color, UA Amber    Clarity, UA     Glucose, UA Negative Negative   Bilirubin, UA Negative    Ketones, UA Positive    Spec Grav, UA >=1.030 (A) 1.010 - 1.025   Blood, UA Negative    pH, UA 6.0 5.0 - 8.0   Protein, UA Positive (A) Negative   Urobilinogen, UA 0.2 0.2 or 1.0 E.U./dL   Nitrite, UA Negative    Leukocytes, UA Negative Negative   Appearance Cloudy    Odor    Micro: WBC rare  RBC rare  Bact tr  Casts none  Epi rare  UCx not sent   Lab Results  Component Value Date   VITAMINB12 596 04/28/2024    Lab Results  Component Value Date   TSH 3.630 08/26/2023  RPR negative 12/2022  Assessment & Plan:   Problem List Items Addressed This Visit     OSA treated with BiPAP   Continues BiPAP followed by pulm - due for f/u. Sleep vocalization without other motor disorder likely  normal variant.  No other noted parasomnias, not consistent with REM sleep behavior disorder.       BPH associated with nocturia   PSA 3s.  Reassuring DRE UA today - no signs of  blood or infection.  Significant LUTS - start flomax  0.4mg  nightly, update with effect.       Relevant Orders   POCT urinalysis dipstick (Completed)   Memory deficit - Primary   Again reassuring evaluation today.  Reviewed lifestyle strategies to support a healthy mind as per instructions. Update if new or worsening symptoms for further evaluation.      Ascending aorta dilatation   46mm on latest check 07/2024 planned monitoring q6 mo this is followed by cardiology       Erectile dysfunction   Continue viagra  which is effective.       Elevated blood pressure reading without diagnosis of hypertension   BP mildly elevated - rec low salt/sodium diet, increased water.       Anxiety   Anxiety>  depression Continue wellbutrin  SR 150mg  daily - declines change in regimen.         Meds ordered this encounter  Medications   tamsulosin  (FLOMAX ) 0.4 MG CAPS capsule    Sig: Take 1 capsule (0.4 mg total) by mouth daily.    Dispense:  30 capsule    Refill:  6    Orders Placed This Encounter  Procedures   POCT urinalysis dipstick    Patient Instructions  Reviewed 4 core lifestyle modifications to support a healthy mind:  1. Nutritious well balance diet.  2. Regular physical activity routine.  3. Regular mental activity such as reading books, word puzzles, math puzzles, jigsaw puzzles.  4. Social engagement.  Also ensure good blood pressure control, limit alcohol, no smoking.   Let me know if worsening memory trouble -testing today overall normal.  Urinalysis today  Prostate exam suspicious for benign enlargement of the prostate.  Start flomax  0.4mg  nightly.   Let us  know if new or worsening symptoms develop  Follow up plan: No follow-ups on file.  Anton Blas, MD   "

## 2024-11-13 DIAGNOSIS — R030 Elevated blood-pressure reading, without diagnosis of hypertension: Secondary | ICD-10-CM | POA: Insufficient documentation

## 2024-11-13 DIAGNOSIS — F419 Anxiety disorder, unspecified: Secondary | ICD-10-CM | POA: Insufficient documentation

## 2024-11-13 NOTE — Assessment & Plan Note (Signed)
 Continues BiPAP followed by pulm - due for f/u. Sleep vocalization without other motor disorder likely normal variant.  No other noted parasomnias, not consistent with REM sleep behavior disorder.

## 2024-11-13 NOTE — Assessment & Plan Note (Signed)
 PSA 3s.  Reassuring DRE UA today - no signs of blood or infection.  Significant LUTS - start flomax  0.4mg  nightly, update with effect.

## 2024-11-13 NOTE — Assessment & Plan Note (Signed)
 BP mildly elevated - rec low salt/sodium diet, increased water.

## 2024-11-13 NOTE — Assessment & Plan Note (Signed)
Continue viagra which is effective.

## 2024-11-13 NOTE — Assessment & Plan Note (Signed)
 46mm on latest check 07/2024 planned monitoring q6 mo this is followed by cardiology

## 2024-11-13 NOTE — Assessment & Plan Note (Signed)
 Again reassuring evaluation today.  Reviewed lifestyle strategies to support a healthy mind as per instructions. Update if new or worsening symptoms for further evaluation.

## 2024-11-13 NOTE — Assessment & Plan Note (Signed)
 Anxiety>  depression Continue wellbutrin  SR 150mg  daily - declines change in regimen.

## 2025-01-05 ENCOUNTER — Other Ambulatory Visit

## 2025-05-02 ENCOUNTER — Other Ambulatory Visit

## 2025-05-09 ENCOUNTER — Encounter: Admitting: Family Medicine
